# Patient Record
Sex: Female | Born: 1935 | Race: White | Hispanic: No | Smoking: Never smoker
Health system: Southern US, Community
[De-identification: ages and names within clinical notes are randomized; demographics above are authoritative.]

## PROBLEM LIST (undated history)

## (undated) DIAGNOSIS — Z923 Personal history of irradiation: Secondary | ICD-10-CM

## (undated) DIAGNOSIS — M1990 Unspecified osteoarthritis, unspecified site: Secondary | ICD-10-CM

## (undated) DIAGNOSIS — R011 Cardiac murmur, unspecified: Secondary | ICD-10-CM

## (undated) DIAGNOSIS — K219 Gastro-esophageal reflux disease without esophagitis: Secondary | ICD-10-CM

## (undated) DIAGNOSIS — C50919 Malignant neoplasm of unspecified site of unspecified female breast: Secondary | ICD-10-CM

## (undated) DIAGNOSIS — N183 Chronic kidney disease, stage 3 unspecified: Secondary | ICD-10-CM

## (undated) DIAGNOSIS — I10 Essential (primary) hypertension: Secondary | ICD-10-CM

## (undated) DIAGNOSIS — I700 Atherosclerosis of aorta: Secondary | ICD-10-CM

## (undated) DIAGNOSIS — T7840XA Allergy, unspecified, initial encounter: Secondary | ICD-10-CM

## (undated) DIAGNOSIS — K449 Diaphragmatic hernia without obstruction or gangrene: Secondary | ICD-10-CM

## (undated) HISTORY — DX: Allergy, unspecified, initial encounter: T78.40XA

## (undated) HISTORY — PX: ABDOMINAL HYSTERECTOMY: SHX81

## (undated) HISTORY — DX: Essential (primary) hypertension: I10

## (undated) HISTORY — PX: BREAST CYST ASPIRATION: SHX578

---

## 2010-04-14 DIAGNOSIS — K8020 Calculus of gallbladder without cholecystitis without obstruction: Secondary | ICD-10-CM | POA: Insufficient documentation

## 2010-04-14 DIAGNOSIS — D126 Benign neoplasm of colon, unspecified: Secondary | ICD-10-CM | POA: Insufficient documentation

## 2010-04-14 DIAGNOSIS — E559 Vitamin D deficiency, unspecified: Secondary | ICD-10-CM | POA: Insufficient documentation

## 2010-04-14 DIAGNOSIS — M8580 Other specified disorders of bone density and structure, unspecified site: Secondary | ICD-10-CM | POA: Insufficient documentation

## 2010-04-14 DIAGNOSIS — M85851 Other specified disorders of bone density and structure, right thigh: Secondary | ICD-10-CM | POA: Insufficient documentation

## 2011-01-30 HISTORY — PX: BREAST BIOPSY: SHX20

## 2012-01-30 DIAGNOSIS — C50919 Malignant neoplasm of unspecified site of unspecified female breast: Secondary | ICD-10-CM

## 2012-01-30 HISTORY — DX: Malignant neoplasm of unspecified site of unspecified female breast: C50.919

## 2012-01-30 HISTORY — PX: BREAST BIOPSY: SHX20

## 2012-04-02 DIAGNOSIS — C50919 Malignant neoplasm of unspecified site of unspecified female breast: Secondary | ICD-10-CM | POA: Insufficient documentation

## 2012-04-02 DIAGNOSIS — C50912 Malignant neoplasm of unspecified site of left female breast: Secondary | ICD-10-CM | POA: Insufficient documentation

## 2012-04-02 HISTORY — PX: BREAST LUMPECTOMY: SHX2

## 2013-04-14 DIAGNOSIS — M899 Disorder of bone, unspecified: Secondary | ICD-10-CM | POA: Diagnosis not present

## 2013-04-14 DIAGNOSIS — M949 Disorder of cartilage, unspecified: Secondary | ICD-10-CM | POA: Diagnosis not present

## 2013-04-14 DIAGNOSIS — Z79899 Other long term (current) drug therapy: Secondary | ICD-10-CM | POA: Diagnosis not present

## 2013-04-14 DIAGNOSIS — C50919 Malignant neoplasm of unspecified site of unspecified female breast: Secondary | ICD-10-CM | POA: Diagnosis not present

## 2013-05-21 DIAGNOSIS — C50919 Malignant neoplasm of unspecified site of unspecified female breast: Secondary | ICD-10-CM | POA: Diagnosis not present

## 2013-05-21 DIAGNOSIS — E785 Hyperlipidemia, unspecified: Secondary | ICD-10-CM | POA: Diagnosis not present

## 2013-05-21 DIAGNOSIS — R011 Cardiac murmur, unspecified: Secondary | ICD-10-CM | POA: Diagnosis not present

## 2013-05-21 DIAGNOSIS — I10 Essential (primary) hypertension: Secondary | ICD-10-CM | POA: Diagnosis not present

## 2013-05-25 DIAGNOSIS — E785 Hyperlipidemia, unspecified: Secondary | ICD-10-CM | POA: Diagnosis not present

## 2013-05-25 DIAGNOSIS — I10 Essential (primary) hypertension: Secondary | ICD-10-CM | POA: Diagnosis not present

## 2013-06-25 ENCOUNTER — Ambulatory Visit: Payer: Self-pay | Admitting: Hematology and Oncology

## 2013-06-25 DIAGNOSIS — Z170 Estrogen receptor positive status [ER+]: Secondary | ICD-10-CM | POA: Diagnosis not present

## 2013-06-25 DIAGNOSIS — M899 Disorder of bone, unspecified: Secondary | ICD-10-CM | POA: Diagnosis not present

## 2013-06-25 DIAGNOSIS — I10 Essential (primary) hypertension: Secondary | ICD-10-CM | POA: Diagnosis not present

## 2013-06-25 DIAGNOSIS — Z9010 Acquired absence of unspecified breast and nipple: Secondary | ICD-10-CM | POA: Diagnosis not present

## 2013-06-25 DIAGNOSIS — E039 Hypothyroidism, unspecified: Secondary | ICD-10-CM | POA: Diagnosis not present

## 2013-06-25 DIAGNOSIS — C50919 Malignant neoplasm of unspecified site of unspecified female breast: Secondary | ICD-10-CM | POA: Diagnosis not present

## 2013-06-29 ENCOUNTER — Ambulatory Visit: Payer: Self-pay | Admitting: Internal Medicine

## 2013-07-16 DIAGNOSIS — M1990 Unspecified osteoarthritis, unspecified site: Secondary | ICD-10-CM | POA: Diagnosis not present

## 2013-07-16 DIAGNOSIS — E785 Hyperlipidemia, unspecified: Secondary | ICD-10-CM | POA: Diagnosis not present

## 2013-07-16 DIAGNOSIS — C50919 Malignant neoplasm of unspecified site of unspecified female breast: Secondary | ICD-10-CM | POA: Diagnosis not present

## 2013-07-16 DIAGNOSIS — I10 Essential (primary) hypertension: Secondary | ICD-10-CM | POA: Diagnosis not present

## 2013-08-26 DIAGNOSIS — H2510 Age-related nuclear cataract, unspecified eye: Secondary | ICD-10-CM | POA: Diagnosis not present

## 2013-10-05 DIAGNOSIS — Z23 Encounter for immunization: Secondary | ICD-10-CM | POA: Diagnosis not present

## 2013-10-12 ENCOUNTER — Ambulatory Visit: Payer: Self-pay | Admitting: Hematology and Oncology

## 2013-10-12 DIAGNOSIS — R928 Other abnormal and inconclusive findings on diagnostic imaging of breast: Secondary | ICD-10-CM | POA: Diagnosis not present

## 2013-10-12 DIAGNOSIS — Z853 Personal history of malignant neoplasm of breast: Secondary | ICD-10-CM | POA: Diagnosis not present

## 2013-10-13 ENCOUNTER — Ambulatory Visit: Payer: Self-pay | Admitting: Internal Medicine

## 2013-10-13 DIAGNOSIS — Z9010 Acquired absence of unspecified breast and nipple: Secondary | ICD-10-CM | POA: Diagnosis not present

## 2013-10-13 DIAGNOSIS — Z79899 Other long term (current) drug therapy: Secondary | ICD-10-CM | POA: Diagnosis not present

## 2013-10-13 DIAGNOSIS — Z923 Personal history of irradiation: Secondary | ICD-10-CM | POA: Diagnosis not present

## 2013-10-13 DIAGNOSIS — Z853 Personal history of malignant neoplasm of breast: Secondary | ICD-10-CM | POA: Diagnosis not present

## 2013-10-13 DIAGNOSIS — M949 Disorder of cartilage, unspecified: Secondary | ICD-10-CM | POA: Diagnosis not present

## 2013-10-13 DIAGNOSIS — Z170 Estrogen receptor positive status [ER+]: Secondary | ICD-10-CM | POA: Diagnosis not present

## 2013-10-13 DIAGNOSIS — M899 Disorder of bone, unspecified: Secondary | ICD-10-CM | POA: Diagnosis not present

## 2013-10-13 DIAGNOSIS — E785 Hyperlipidemia, unspecified: Secondary | ICD-10-CM | POA: Diagnosis not present

## 2013-10-13 DIAGNOSIS — Z9223 Personal history of estrogen therapy: Secondary | ICD-10-CM | POA: Diagnosis not present

## 2013-10-13 DIAGNOSIS — I10 Essential (primary) hypertension: Secondary | ICD-10-CM | POA: Diagnosis not present

## 2013-10-13 DIAGNOSIS — Z7982 Long term (current) use of aspirin: Secondary | ICD-10-CM | POA: Diagnosis not present

## 2013-10-13 LAB — COMPREHENSIVE METABOLIC PANEL
Albumin: 4 g/dL (ref 3.4–5.0)
Alkaline Phosphatase: 88 U/L
Anion Gap: 8 (ref 7–16)
BUN: 24 mg/dL — AB (ref 7–18)
Bilirubin,Total: 0.9 mg/dL (ref 0.2–1.0)
Calcium, Total: 10.1 mg/dL (ref 8.5–10.1)
Chloride: 102 mmol/L (ref 98–107)
Co2: 27 mmol/L (ref 21–32)
Creatinine: 1.11 mg/dL (ref 0.60–1.30)
EGFR (African American): 56 — ABNORMAL LOW
GFR CALC NON AF AMER: 49 — AB
Glucose: 103 mg/dL — ABNORMAL HIGH (ref 65–99)
OSMOLALITY: 278 (ref 275–301)
POTASSIUM: 4.3 mmol/L (ref 3.5–5.1)
SGOT(AST): 15 U/L (ref 15–37)
SGPT (ALT): 18 U/L
Sodium: 137 mmol/L (ref 136–145)
Total Protein: 7.4 g/dL (ref 6.4–8.2)

## 2013-10-13 LAB — CBC CANCER CENTER
Basophil #: 0 x10 3/mm (ref 0.0–0.1)
Basophil %: 0.3 %
EOS PCT: 1.6 %
Eosinophil #: 0.1 x10 3/mm (ref 0.0–0.7)
HCT: 41.1 % (ref 35.0–47.0)
HGB: 13.7 g/dL (ref 12.0–16.0)
LYMPHS ABS: 1.4 x10 3/mm (ref 1.0–3.6)
Lymphocyte %: 24 %
MCH: 31.5 pg (ref 26.0–34.0)
MCHC: 33.5 g/dL (ref 32.0–36.0)
MCV: 94 fL (ref 80–100)
MONOS PCT: 6.3 %
Monocyte #: 0.4 x10 3/mm (ref 0.2–0.9)
NEUTROS ABS: 4.1 x10 3/mm (ref 1.4–6.5)
Neutrophil %: 67.8 %
PLATELETS: 194 x10 3/mm (ref 150–440)
RBC: 4.36 10*6/uL (ref 3.80–5.20)
RDW: 12.5 % (ref 11.5–14.5)
WBC: 6 x10 3/mm (ref 3.6–11.0)

## 2013-10-14 LAB — CANCER ANTIGEN 27.29: CA 27.29: 30.2 U/mL (ref 0.0–38.6)

## 2013-10-29 ENCOUNTER — Ambulatory Visit: Payer: Self-pay | Admitting: Internal Medicine

## 2013-11-20 DIAGNOSIS — I779 Disorder of arteries and arterioles, unspecified: Secondary | ICD-10-CM | POA: Diagnosis not present

## 2013-11-20 DIAGNOSIS — M170 Bilateral primary osteoarthritis of knee: Secondary | ICD-10-CM | POA: Diagnosis not present

## 2013-11-20 DIAGNOSIS — R011 Cardiac murmur, unspecified: Secondary | ICD-10-CM | POA: Diagnosis not present

## 2013-11-20 DIAGNOSIS — M8580 Other specified disorders of bone density and structure, unspecified site: Secondary | ICD-10-CM | POA: Diagnosis not present

## 2013-11-20 DIAGNOSIS — I10 Essential (primary) hypertension: Secondary | ICD-10-CM | POA: Diagnosis not present

## 2013-11-20 DIAGNOSIS — E785 Hyperlipidemia, unspecified: Secondary | ICD-10-CM | POA: Diagnosis not present

## 2013-11-20 DIAGNOSIS — D0512 Intraductal carcinoma in situ of left breast: Secondary | ICD-10-CM | POA: Diagnosis not present

## 2014-04-13 ENCOUNTER — Ambulatory Visit
Admit: 2014-04-13 | Disposition: A | Discharge status: Home / Self Care | Payer: Self-pay | Attending: Hematology and Oncology | Admitting: Hematology and Oncology

## 2014-04-13 DIAGNOSIS — E785 Hyperlipidemia, unspecified: Secondary | ICD-10-CM | POA: Diagnosis not present

## 2014-04-13 DIAGNOSIS — Z9223 Personal history of estrogen therapy: Secondary | ICD-10-CM | POA: Diagnosis not present

## 2014-04-13 DIAGNOSIS — Z170 Estrogen receptor positive status [ER+]: Secondary | ICD-10-CM | POA: Diagnosis not present

## 2014-04-13 DIAGNOSIS — M549 Dorsalgia, unspecified: Secondary | ICD-10-CM | POA: Diagnosis not present

## 2014-04-13 DIAGNOSIS — Z923 Personal history of irradiation: Secondary | ICD-10-CM | POA: Diagnosis not present

## 2014-04-13 DIAGNOSIS — Z9012 Acquired absence of left breast and nipple: Secondary | ICD-10-CM | POA: Diagnosis not present

## 2014-04-13 DIAGNOSIS — Z7982 Long term (current) use of aspirin: Secondary | ICD-10-CM | POA: Diagnosis not present

## 2014-04-13 DIAGNOSIS — M25569 Pain in unspecified knee: Secondary | ICD-10-CM | POA: Diagnosis not present

## 2014-04-13 DIAGNOSIS — M85852 Other specified disorders of bone density and structure, left thigh: Secondary | ICD-10-CM | POA: Diagnosis not present

## 2014-04-13 DIAGNOSIS — Z853 Personal history of malignant neoplasm of breast: Secondary | ICD-10-CM | POA: Diagnosis not present

## 2014-04-13 DIAGNOSIS — I10 Essential (primary) hypertension: Secondary | ICD-10-CM | POA: Diagnosis not present

## 2014-04-13 DIAGNOSIS — Z79899 Other long term (current) drug therapy: Secondary | ICD-10-CM | POA: Diagnosis not present

## 2014-04-30 ENCOUNTER — Ambulatory Visit
Admit: 2014-04-30 | Disposition: A | Discharge status: Home / Self Care | Payer: Self-pay | Attending: Hematology and Oncology | Admitting: Hematology and Oncology

## 2014-05-14 ENCOUNTER — Other Ambulatory Visit: Payer: Self-pay | Admitting: Hematology and Oncology

## 2014-05-14 DIAGNOSIS — M949 Disorder of cartilage, unspecified: Secondary | ICD-10-CM

## 2014-10-14 ENCOUNTER — Ambulatory Visit
Admission: RE | Admit: 2014-10-14 | Discharge: 2014-10-14 | Disposition: A | Discharge status: Home / Self Care | Payer: Medicare Other | Source: Ambulatory Visit | Attending: Hematology and Oncology | Admitting: Hematology and Oncology

## 2014-10-14 DIAGNOSIS — M859 Disorder of bone density and structure, unspecified: Secondary | ICD-10-CM | POA: Diagnosis not present

## 2014-10-14 DIAGNOSIS — R928 Other abnormal and inconclusive findings on diagnostic imaging of breast: Secondary | ICD-10-CM | POA: Diagnosis not present

## 2014-10-14 DIAGNOSIS — M949 Disorder of cartilage, unspecified: Secondary | ICD-10-CM

## 2014-10-14 DIAGNOSIS — Z853 Personal history of malignant neoplasm of breast: Secondary | ICD-10-CM | POA: Diagnosis not present

## 2014-10-14 HISTORY — DX: Malignant neoplasm of unspecified site of unspecified female breast: C50.919

## 2014-10-21 ENCOUNTER — Inpatient Hospital Stay: Payer: Medicare Other

## 2014-10-21 ENCOUNTER — Encounter: Payer: Self-pay | Admitting: Hematology and Oncology

## 2014-10-21 ENCOUNTER — Inpatient Hospital Stay: Payer: Medicare Other | Attending: Hematology and Oncology | Admitting: Hematology and Oncology

## 2014-10-21 ENCOUNTER — Other Ambulatory Visit: Payer: Self-pay

## 2014-10-21 VITALS — BP 125/76 | HR 73 | Temp 97.4°F | Resp 18 | Ht 63.0 in | Wt 181.2 lb

## 2014-10-21 DIAGNOSIS — Z853 Personal history of malignant neoplasm of breast: Secondary | ICD-10-CM | POA: Diagnosis not present

## 2014-10-21 DIAGNOSIS — I10 Essential (primary) hypertension: Secondary | ICD-10-CM | POA: Diagnosis not present

## 2014-10-21 DIAGNOSIS — Z79899 Other long term (current) drug therapy: Secondary | ICD-10-CM

## 2014-10-21 DIAGNOSIS — Z803 Family history of malignant neoplasm of breast: Secondary | ICD-10-CM | POA: Diagnosis not present

## 2014-10-21 DIAGNOSIS — Z170 Estrogen receptor positive status [ER+]: Secondary | ICD-10-CM | POA: Insufficient documentation

## 2014-10-21 DIAGNOSIS — Z9223 Personal history of estrogen therapy: Secondary | ICD-10-CM | POA: Diagnosis not present

## 2014-10-21 DIAGNOSIS — C50912 Malignant neoplasm of unspecified site of left female breast: Secondary | ICD-10-CM

## 2014-10-21 DIAGNOSIS — M8589 Other specified disorders of bone density and structure, multiple sites: Secondary | ICD-10-CM | POA: Diagnosis not present

## 2014-10-21 LAB — CBC WITH DIFFERENTIAL/PLATELET
Basophils Absolute: 0 10*3/uL (ref 0–0.1)
Basophils Relative: 0 %
Eosinophils Absolute: 0.1 10*3/uL (ref 0–0.7)
Eosinophils Relative: 2 %
HCT: 37 % (ref 35.0–47.0)
Hemoglobin: 12.6 g/dL (ref 12.0–16.0)
Lymphocytes Relative: 22 %
Lymphs Abs: 0.9 10*3/uL — ABNORMAL LOW (ref 1.0–3.6)
MCH: 30.9 pg (ref 26.0–34.0)
MCHC: 34.1 g/dL (ref 32.0–36.0)
MCV: 90.8 fL (ref 80.0–100.0)
Monocytes Absolute: 0.3 10*3/uL (ref 0.2–0.9)
Monocytes Relative: 7 %
Neutro Abs: 2.7 10*3/uL (ref 1.4–6.5)
Neutrophils Relative %: 69 %
Platelets: 156 10*3/uL (ref 150–440)
RBC: 4.08 MIL/uL (ref 3.80–5.20)
RDW: 12.8 % (ref 11.5–14.5)
WBC: 3.9 10*3/uL (ref 3.6–11.0)

## 2014-10-21 LAB — COMPREHENSIVE METABOLIC PANEL
ALT: 12 U/L — ABNORMAL LOW (ref 14–54)
AST: 21 U/L (ref 15–41)
Albumin: 4.1 g/dL (ref 3.5–5.0)
Alkaline Phosphatase: 59 U/L (ref 38–126)
Anion gap: 7 (ref 5–15)
BUN: 29 mg/dL — ABNORMAL HIGH (ref 6–20)
CO2: 25 mmol/L (ref 22–32)
Calcium: 8.8 mg/dL — ABNORMAL LOW (ref 8.9–10.3)
Chloride: 104 mmol/L (ref 101–111)
Creatinine, Ser: 1.25 mg/dL — ABNORMAL HIGH (ref 0.44–1.00)
GFR calc Af Amer: 47 mL/min — ABNORMAL LOW (ref >60)
GFR calc non Af Amer: 41 mL/min — ABNORMAL LOW (ref >60)
Glucose, Bld: 111 mg/dL — ABNORMAL HIGH (ref 65–99)
Potassium: 3.6 mmol/L (ref 3.5–5.1)
Sodium: 136 mmol/L (ref 135–145)
Total Bilirubin: 0.8 mg/dL (ref 0.3–1.2)
Total Protein: 7.1 g/dL (ref 6.5–8.1)

## 2014-10-21 NOTE — Progress Notes (Signed)
[facility] day:  10/21/2014  Chief Complaint: Angela Bullock is a 79 y.o. female with stage I left breast cancer who is seen for 6 month assessment.  HPI: The patient was last seen in the medical oncology clinic on 04/13/2014.  At that time, she was seen for initial assessment by me.  Symptomatically, she was doing well.  Exam was unremarkable.  Labs were normal including a CBC with diff, CMP, and CA27.29 (25.1).  Mammogram on 10/14/2014 was benign.  Bone density study on 10/14/2014 revealed a T-score of -1.7 in the right femur neck and a T-score of -0.3 in the left forearm radius.  During the interim, she has done well.  She voices no concerns except for allergies.  She moved to a new area and feels she may be allergic to the trees.    Past Medical History  Diagnosis Date  . Breast cancer 01/30/2012    left, radiation and lumpectomy  . Allergy   . Hypertension     Past Surgical History  Procedure Laterality Date  . Breast lumpectomy Left 04/02/2012    positive  . Breast cyst aspiration    . Breast biopsy Left 01/30/2011    negative  . Breast biopsy Left 01/30/2012    positive  . Abdominal hysterectomy      Family History  Problem Relation Age of Onset  . Breast cancer Paternal Aunt 85  . Osteoporosis Mother   . Heart attack Father     Social History:  has no tobacco, alcohol, and drug history on file.  The patient is alone today.  Allergies: No Known Allergies  Current Medications: Current Outpatient Prescriptions  Medication Sig Dispense Refill  . atorvastatin (LIPITOR) 10 MG tablet Take 1 tablet by mouth daily.  3  . calcium carbonate (OSCAL) 1500 (600 CA) MG TABS tablet Take by mouth 2 (two) times daily with a meal.    . cholecalciferol (VITAMIN D) 1000 UNITS tablet Take 1,000 Units by mouth daily.    Marland Kitchen co-enzyme Q-10 50 MG capsule Take 50 mg by mouth daily.    Marland Kitchen glucosamine-chondroitin 500-400 MG tablet Take 1 tablet by mouth  3 (three) times daily.    Marland Kitchen ibandronate (BONIVA) 150 MG tablet Take 1 tablet by mouth every 30 (thirty) days.  3  . lisinopril-hydrochlorothiazide (PRINZIDE,ZESTORETIC) 10-12.5 MG per tablet Take 1 tablet by mouth daily.  3  . metoprolol tartrate (LOPRESSOR) 25 MG tablet Take 1 tablet by mouth daily.  3  . Misc Natural Products (GLUCOSAMINE CHONDROITIN TRIPLE) TABS Take 1 tablet by mouth daily.    . Multiple Vitamins-Minerals (CENTRUM SILVER PO) Take 1 tablet by mouth daily.    . Omega-3 Krill Oil 300 MG CAPS Take 1 capsule by mouth daily.     No current facility-administered medications for this visit.    Review of Systems:  GENERAL:  Feels good.  Active.  No fevers, sweats or weight loss. PERFORMANCE STATUS (ECOG):  0 HEENT:  Allergies.  No visual changes, runny nose, sore throat, mouth sores or tenderness. Lungs: No shortness of breath or cough.  No hemoptysis. Cardiac:  No chest pain, palpitations, orthopnea, or PND. GI:  No nausea, vomiting, diarrhea, constipation, melena or hematochezia. GU:  No urgency, frequency, dysuria, or hematuria. Musculoskeletal:  No back pain.  No joint pain.  No muscle tenderness. Extremities:  No pain or swelling. Skin:  No rashes or skin changes. Neuro:  No headache, numbness or  weakness, balance or coordination issues. Endocrine:  No diabetes, thyroid issues, hot flashes or night sweats. Psych:  No mood changes, depression or anxiety. Pain:  No focal pain. Review of systems:  All other systems reviewed and found to be negative.  Physical Exam: Blood pressure 125/76, pulse 73, temperature 97.4 F (36.3 C), temperature source Tympanic, resp. rate 18, height $RemoveBe'5\' 3"'[id number]$  (1.6 m), weight 181 lb 3.5 oz (82.2 kg). GENERAL:  Well developed, well nourished, sitting comfortably in the exam room in no acute distress. MENTAL STATUS:  Alert and oriented to person, place and time. HEAD:  Short gray hair.  Normocephalic, atraumatic, face symmetric, no Cushingoid  features. EYES:  Blue eyes.  Pupils equal round and reactive to light and accomodation.  No conjunctivitis or scleral icterus. ENT:  Oropharynx clear without lesion.  Tongue normal. Mucous membranes moist.  RESPIRATORY:  Clear to auscultation without rales, wheezes or rhonchi. CARDIOVASCULAR:  Regular rate and rhythm without murmur, rub or gallop. BREAST:  Right breast without masses, skin changes or nipple discharge.  Left breast with well healed incision at the 2-3 o'clock position.  Left breast without masses, skin changes or nipple discharge.  Bilateral fibrocystic changes. ABDOMEN:  Soft, non-tender, with active bowel sounds, and no hepatosplenomegaly.  No masses. SKIN:  No rashes, ulcers or lesions. EXTREMITIES: No edema, no skin discoloration or tenderness.  No palpable cords. LYMPH NODES: No palpable cervical, supraclavicular, axillary or inguinal adenopathy  NEUROLOGICAL: Unremarkable. PSYCH:  Appropriate.  Appointment on 10/21/2014  Component Date Value Ref Range Status  . WBC 10/21/2014 3.9  3.6 - 11.0 K/uL Final  . RBC 10/21/2014 4.08  3.80 - 5.20 MIL/uL Final  . Hemoglobin 10/21/2014 12.6  12.0 - 16.0 g/dL Final  . HCT 10/21/2014 37.0  35.0 - 47.0 % Final  . MCV 10/21/2014 90.8  80.0 - 100.0 fL Final  . MCH 10/21/2014 30.9  26.0 - 34.0 pg Final  . MCHC 10/21/2014 34.1  32.0 - 36.0 g/dL Final  . RDW 10/21/2014 12.8  11.5 - 14.5 % Final  . Platelets 10/21/2014 156  150 - 440 K/uL Final  . Neutrophils Relative % 10/21/2014 69   Final  . Neutro Abs 10/21/2014 2.7  1.4 - 6.5 K/uL Final  . Lymphocytes Relative 10/21/2014 22   Final  . Lymphs Abs 10/21/2014 0.9* 1.0 - 3.6 K/uL Final  . Monocytes Relative 10/21/2014 7   Final  . Monocytes Absolute 10/21/2014 0.3  0.2 - 0.9 K/uL Final  . Eosinophils Relative 10/21/2014 2   Final  . Eosinophils Absolute 10/21/2014 0.1  0 - 0.7 K/uL Final  . Basophils Relative 10/21/2014 0   Final  . Basophils Absolute 10/21/2014 0.0  0 - 0.1 K/uL  Final  . Sodium 10/21/2014 136  135 - 145 mmol/L Final  . Potassium 10/21/2014 3.6  3.5 - 5.1 mmol/L Final  . Chloride 10/21/2014 104  101 - 111 mmol/L Final  . CO2 10/21/2014 25  22 - 32 mmol/L Final  . Glucose, Bld 10/21/2014 111* 65 - 99 mg/dL Final  . BUN 10/21/2014 29* 6 - 20 mg/dL Final  . Creatinine, Ser 10/21/2014 1.25* 0.44 - 1.00 mg/dL Final  . Calcium 10/21/2014 8.8* 8.9 - 10.3 mg/dL Final  . Total Protein 10/21/2014 7.1  6.5 - 8.1 g/dL Final  . Albumin 10/21/2014 4.1  3.5 - 5.0 g/dL Final  . AST 10/21/2014 21  15 - 41 U/L Final  . ALT 10/21/2014 12* 14 - 54 U/L Final  .  Alkaline Phosphatase 10/21/2014 59  38 - 126 U/L Final  . Total Bilirubin 10/21/2014 0.8  0.3 - 1.2 mg/dL Final  . GFR calc non Af Amer 10/21/2014 41* >60 mL/min Final  . GFR calc Af Amer 10/21/2014 47* >60 mL/min Final   Comment: (NOTE) The eGFR has been calculated using the CKD EPI equation. This calculation has not been validated in all clinical situations. eGFR's persistently <60 mL/min signify possible Chronic Kidney Disease.   . Anion gap 10/21/2014 7  5 - 15 Final    Assessment:  Angela Bullock is a 79 y.o. female with a history of stage I left breast cancer status post excisional biopsy on 04/02/2012. Pathology revealed a 1.3 cm moderately differentiated basal cell carcinoma. There was no DCIS or vascular invasion. Tumor extended to the lateral margin.  Pathology was ER +100%, PR +73% and HER-2/neu 1+.  She then underwent left partial mastectomy with sentinel lymph node biopsy on 05/02/2012.  Pathology revealed no evidence of residual malignancy. Margins were negative. Zero of 6 sentinel lymph nodes were positive. Pathologic stage was T1cN0M0.  She underwent Mammosite radiation. She began Arimidex on 05/2012. She took Arimidex for approximately 1 year. She discontinued Arimidex in 05/2013 as she did not like the side effect profile.  Bone density study on 10/08/2012 revealed osteopenia (T score -1.8 in  left femoral neck).  Bone density study on 10/14/2014 revealed a T-score of -1.7 in the right femur neck.  She is on calcium and vitamin D.  Mammogram on 10/14/2014 was benign.   Symptomatically, she has had issues with allergies. Exam is unremarkable.  Plan: 1. Labs today:  CBC with diff, CMP, CA27.29. 2. Re-review patient's decision for no hormonal therapy. 3. Review mammogram and bone density study. 4. Discuss monthly self exams. 5. RTC in 6 months for MD assess and labs (CBC with diff, CMP, CA27.29).   Lequita Asal, MD  10/21/2014, 11:25 AM

## 2014-10-22 ENCOUNTER — Other Ambulatory Visit: Payer: Self-pay | Admitting: Family Medicine

## 2014-10-22 ENCOUNTER — Ambulatory Visit (INDEPENDENT_AMBULATORY_CARE_PROVIDER_SITE_OTHER): Payer: Medicare Other | Admitting: *Deleted

## 2014-10-22 DIAGNOSIS — Z23 Encounter for immunization: Secondary | ICD-10-CM

## 2014-10-22 LAB — CANCER ANTIGEN 27.29: CA 27.29: 33.7 U/mL (ref 0.0–38.6)

## 2014-10-25 NOTE — Patient Instructions (Signed)
Patient was in the office with her spouse and got the high dose flu injection.

## 2014-11-19 DIAGNOSIS — L720 Epidermal cyst: Secondary | ICD-10-CM | POA: Diagnosis not present

## 2014-11-19 DIAGNOSIS — D225 Melanocytic nevi of trunk: Secondary | ICD-10-CM | POA: Diagnosis not present

## 2014-11-26 ENCOUNTER — Other Ambulatory Visit: Payer: Self-pay | Admitting: Family Medicine

## 2014-12-22 DIAGNOSIS — H2513 Age-related nuclear cataract, bilateral: Secondary | ICD-10-CM | POA: Diagnosis not present

## 2014-12-25 ENCOUNTER — Other Ambulatory Visit: Payer: Self-pay | Admitting: Family Medicine

## 2015-02-10 ENCOUNTER — Other Ambulatory Visit: Payer: Self-pay | Admitting: Family Medicine

## 2015-04-21 ENCOUNTER — Ambulatory Visit: Payer: Medicare Other | Admitting: Hematology and Oncology

## 2015-04-21 ENCOUNTER — Other Ambulatory Visit: Payer: Medicare Other

## 2015-04-27 ENCOUNTER — Other Ambulatory Visit: Payer: Self-pay

## 2015-04-27 DIAGNOSIS — C50912 Malignant neoplasm of unspecified site of left female breast: Secondary | ICD-10-CM

## 2015-04-28 ENCOUNTER — Inpatient Hospital Stay (HOSPITAL_BASED_OUTPATIENT_CLINIC_OR_DEPARTMENT_OTHER): Payer: Medicare Other | Admitting: Hematology and Oncology

## 2015-04-28 ENCOUNTER — Encounter: Payer: Self-pay | Admitting: Hematology and Oncology

## 2015-04-28 ENCOUNTER — Inpatient Hospital Stay: Payer: Medicare Other | Attending: Hematology and Oncology

## 2015-04-28 VITALS — BP 145/81 | HR 92 | Temp 96.8°F | Resp 18 | Ht 63.0 in | Wt 179.9 lb

## 2015-04-28 DIAGNOSIS — Z803 Family history of malignant neoplasm of breast: Secondary | ICD-10-CM | POA: Insufficient documentation

## 2015-04-28 DIAGNOSIS — C50912 Malignant neoplasm of unspecified site of left female breast: Secondary | ICD-10-CM

## 2015-04-28 DIAGNOSIS — Z79899 Other long term (current) drug therapy: Secondary | ICD-10-CM | POA: Insufficient documentation

## 2015-04-28 DIAGNOSIS — M8580 Other specified disorders of bone density and structure, unspecified site: Secondary | ICD-10-CM

## 2015-04-28 DIAGNOSIS — Z853 Personal history of malignant neoplasm of breast: Secondary | ICD-10-CM

## 2015-04-28 DIAGNOSIS — I10 Essential (primary) hypertension: Secondary | ICD-10-CM | POA: Diagnosis not present

## 2015-04-28 DIAGNOSIS — Z9223 Personal history of estrogen therapy: Secondary | ICD-10-CM

## 2015-04-28 DIAGNOSIS — Z923 Personal history of irradiation: Secondary | ICD-10-CM | POA: Insufficient documentation

## 2015-04-28 DIAGNOSIS — Z170 Estrogen receptor positive status [ER+]: Secondary | ICD-10-CM | POA: Diagnosis not present

## 2015-04-28 LAB — CBC WITH DIFFERENTIAL/PLATELET
Basophils Absolute: 0 10*3/uL (ref 0–0.1)
Basophils Relative: 0 %
Eosinophils Absolute: 0.1 10*3/uL (ref 0–0.7)
Eosinophils Relative: 1 %
HCT: 37.3 % (ref 35.0–47.0)
Hemoglobin: 13.1 g/dL (ref 12.0–16.0)
Lymphocytes Relative: 22 %
Lymphs Abs: 1 10*3/uL (ref 1.0–3.6)
MCH: 32 pg (ref 26.0–34.0)
MCHC: 35.1 g/dL (ref 32.0–36.0)
MCV: 91.2 fL (ref 80.0–100.0)
Monocytes Absolute: 0.3 10*3/uL (ref 0.2–0.9)
Monocytes Relative: 6 %
Neutro Abs: 3.1 10*3/uL (ref 1.4–6.5)
Neutrophils Relative %: 71 %
Platelets: 141 10*3/uL — ABNORMAL LOW (ref 150–440)
RBC: 4.1 MIL/uL (ref 3.80–5.20)
RDW: 12.5 % (ref 11.5–14.5)
WBC: 4.5 10*3/uL (ref 3.6–11.0)

## 2015-04-28 LAB — COMPREHENSIVE METABOLIC PANEL
ALT: 16 U/L (ref 14–54)
AST: 21 U/L (ref 15–41)
Albumin: 4.2 g/dL (ref 3.5–5.0)
Alkaline Phosphatase: 63 U/L (ref 38–126)
Anion gap: 6 (ref 5–15)
BUN: 27 mg/dL — ABNORMAL HIGH (ref 6–20)
CO2: 28 mmol/L (ref 22–32)
Calcium: 9.6 mg/dL (ref 8.9–10.3)
Chloride: 104 mmol/L (ref 101–111)
Creatinine, Ser: 1.19 mg/dL — ABNORMAL HIGH (ref 0.44–1.00)
GFR calc Af Amer: 50 mL/min — ABNORMAL LOW (ref >60)
GFR calc non Af Amer: 43 mL/min — ABNORMAL LOW (ref >60)
Glucose, Bld: 110 mg/dL — ABNORMAL HIGH (ref 65–99)
Potassium: 3.9 mmol/L (ref 3.5–5.1)
Sodium: 138 mmol/L (ref 135–145)
Total Bilirubin: 0.9 mg/dL (ref 0.3–1.2)
Total Protein: 7.2 g/dL (ref 6.5–8.1)

## 2015-04-28 NOTE — Progress Notes (Signed)
No changes

## 2015-04-28 NOTE — Progress Notes (Signed)
[facility] day:  04/28/2015  Chief Complaint: Angela Bullock is a 80 y.o. female with stage I left breast cancer who is seen for 6 month assessment.  HPI: The patient was last seen in the medical oncology clinic on 10/21/2014.  At that time, she was seen 6 month assessment.  Symptomatically, she was doing well.  She voiced no concerns except for allergies.  Exam was unremarkable.  Labs were normal including a CA27.29 (33.7).  During the interim, she has done well.  She notes "the same old, same old".  She is not doing regular monthly exams.  She denies any breast concerns.   Past Medical History  Diagnosis Date  . Breast cancer ([place]) 01/30/2012    left, radiation and lumpectomy  . Allergy   . Hypertension     Past Surgical History  Procedure Laterality Date  . Breast lumpectomy Left 04/02/2012    positive  . Breast cyst aspiration    . Breast biopsy Left 01/30/2011    negative  . Breast biopsy Left 01/30/2012    positive  . Abdominal hysterectomy      Family History  Problem Relation Age of Onset  . Breast cancer Paternal Aunt 85  . Osteoporosis Mother   . Heart attack Father     Social History:  reports that she has never smoked. She does not have any smokeless tobacco history on file. She reports that she does not drink alcohol. Her drug history is not on file.  The patient is alone today.  Allergies: No Known Allergies  Current Medications: Current Outpatient Prescriptions  Medication Sig Dispense Refill  . atorvastatin (LIPITOR) 10 MG tablet TAKE 1 TABLET BY MOUTH AT BEDTIME 90 tablet 3  . calcium carbonate (OSCAL) 1500 (600 CA) MG TABS tablet Take by mouth 2 (two) times daily with a meal.    . cholecalciferol (VITAMIN D) 1000 UNITS tablet Take 1,000 Units by mouth daily.    Marland Kitchen co-enzyme Q-10 50 MG capsule Take 50 mg by mouth daily.    Marland Kitchen glucosamine-chondroitin 500-400 MG tablet Take 1 tablet by mouth 3 (three) times daily.     Marland Kitchen ibandronate (BONIVA) 150 MG tablet TAKE 1 TABLET BY MOUTH MONTHLY. 3 tablet 3  . lisinopril-hydrochlorothiazide (PRINZIDE,ZESTORETIC) 10-12.5 MG tablet TAKE 1 TABLET BY MOUTH EVERY DAY 90 tablet 3  . metoprolol tartrate (LOPRESSOR) 25 MG tablet TAKE ONE-HALF TABLET BY MOUTH TWICE DAILY 90 tablet 0  . Misc Natural Products (GLUCOSAMINE CHONDROITIN TRIPLE) TABS Take 1 tablet by mouth daily.    . Multiple Vitamins-Minerals (CENTRUM SILVER PO) Take 1 tablet by mouth daily.    . Omega-3 Krill Oil 300 MG CAPS Take 1 capsule by mouth daily.     No current facility-administered medications for this visit.    Review of Systems:  GENERAL:  Feels good.  No fevers or sweats.  Weight down 2 pounds. PERFORMANCE STATUS (ECOG):  0 HEENT:  Allergies.  No visual changes, runny nose, sore throat, mouth sores or tenderness. Lungs: No shortness of breath or cough.  No hemoptysis. Cardiac:  No chest pain, palpitations, orthopnea, or PND. GI:  No nausea, vomiting, diarrhea, constipation, melena or hematochezia. GU:  No urgency, frequency, dysuria, or hematuria. Musculoskeletal:  No back pain.  No joint pain.  No muscle tenderness. Extremities:  No pain or swelling. Skin:  No rashes or skin changes. Neuro:  No headache, numbness or weakness, balance or coordination issues. Endocrine:  No diabetes, thyroid issues, hot flashes or night sweats. Psych:  No mood changes, depression or anxiety. Pain:  No focal pain. Review of systems:  All other systems reviewed and found to be negative.  Physical Exam: Blood pressure 145/81, pulse 92, temperature 96.8 F (36 C), temperature source Tympanic, resp. rate 18, height _0  (1.6 m), weight 179 lb 14.3 oz (81.6 kg). GENERAL:  Well developed, well nourished, sitting comfortably in the exam room in no acute distress. MENTAL STATUS:  Alert and oriented to person, place and time. HEAD:  Short gray hair.  Normocephalic, atraumatic, face symmetric, no Cushingoid  features. EYES:  Blue eyes.  Pupils equal round and reactive to light and accomodation.  No conjunctivitis or scleral icterus. ENT:  Oropharynx clear without lesion.  Tongue normal. Mucous membranes moist.  RESPIRATORY:  Clear to auscultation without rales, wheezes or rhonchi. CARDIOVASCULAR:  Regular rate and rhythm without murmur, rub or gallop. BREAST:  Right breast without masses, skin changes or nipple discharge.  Left breast with well healed incision at the 2-3 o'clock position.  Left breast without masses, skin changes or nipple discharge.  Bilateral fibrocystic changes. ABDOMEN:  Soft, non-tender, with active bowel sounds, and no appreciable hepatosplenomegaly.  No masses. SKIN:  No rashes, ulcers or lesions. EXTREMITIES: No edema, no skin discoloration or tenderness.  No palpable cords. LYMPH NODES: No palpable cervical, supraclavicular, axillary or inguinal adenopathy  NEUROLOGICAL: Unremarkable. PSYCH:  Appropriate.  Appointment on 04/28/2015  Component Date Value Ref Range Status  . WBC 04/28/2015 4.5  3.6 - 11.0 K/uL Final  . RBC 04/28/2015 4.10  3.80 - 5.20 MIL/uL Final  . Hemoglobin 04/28/2015 13.1  12.0 - 16.0 g/dL Final  . HCT 04/28/2015 37.3  35.0 - 47.0 % Final  . MCV 04/28/2015 91.2  80.0 - 100.0 fL Final  . MCH 04/28/2015 32.0  26.0 - 34.0 pg Final  . MCHC 04/28/2015 35.1  32.0 - 36.0 g/dL Final  . RDW 04/28/2015 12.5  11.5 - 14.5 % Final  . Platelets 04/28/2015 141* 150 - 440 K/uL Final  . Neutrophils Relative % 04/28/2015 71   Final  . Neutro Abs 04/28/2015 3.1  1.4 - 6.5 K/uL Final  . Lymphocytes Relative 04/28/2015 22   Final  . Lymphs Abs 04/28/2015 1.0  1.0 - 3.6 K/uL Final  . Monocytes Relative 04/28/2015 6   Final  . Monocytes Absolute 04/28/2015 0.3  0.2 - 0.9 K/uL Final  . Eosinophils Relative 04/28/2015 1   Final  . Eosinophils Absolute 04/28/2015 0.1  0 - 0.7 K/uL Final  . Basophils Relative 04/28/2015 0   Final  . Basophils Absolute 04/28/2015 0.0  0  - 0.1 K/uL Final  . Sodium 04/28/2015 138  135 - 145 mmol/L Final  . Potassium 04/28/2015 3.9  3.5 - 5.1 mmol/L Final  . Chloride 04/28/2015 104  101 - 111 mmol/L Final  . CO2 04/28/2015 28  22 - 32 mmol/L Final  . Glucose, Bld 04/28/2015 110* 65 - 99 mg/dL Final  . BUN 04/28/2015 27* 6 - 20 mg/dL Final  . Creatinine, Ser 04/28/2015 1.19* 0.44 - 1.00 mg/dL Final  . Calcium 04/28/2015 9.6  8.9 - 10.3 mg/dL Final  . Total Protein 04/28/2015 7.2  6.5 - 8.1 g/dL Final  . Albumin 04/28/2015 4.2  3.5 - 5.0 g/dL Final  . AST 04/28/2015 21  15 - 41 U/L Final  . ALT 04/28/2015 16  14 - 54 U/L Final  . Alkaline Phosphatase  04/28/2015 63  38 - 126 U/L Final  . Total Bilirubin 04/28/2015 0.9  0.3 - 1.2 mg/dL Final  . GFR calc non Af Amer 04/28/2015 43* >60 mL/min Final  . GFR calc Af Amer 04/28/2015 50* >60 mL/min Final   Comment: (NOTE) The eGFR has been calculated using the CKD EPI equation. This calculation has not been validated in all clinical situations. eGFR's persistently <60 mL/min signify possible Chronic Kidney Disease.   . Anion gap 04/28/2015 6  5 - 15 Final    Assessment:  Angela Bullock is a 80 y.o. female with a history of stage I left breast cancer status post excisional biopsy on 04/02/2012. Pathology revealed a 1.3 cm moderately differentiated basal cell carcinoma. There was no DCIS or vascular invasion. Tumor extended to the lateral margin.  Pathology was ER +100%, PR +73% and HER-2/neu 1+.  She then underwent left partial mastectomy with sentinel lymph node biopsy on 05/02/2012.  Pathology revealed no evidence of residual malignancy. Margins were negative. Zero of 6 sentinel lymph nodes were positive. Pathologic stage was T1cN0M0.  She underwent Mammosite radiation. She began Arimidex on 05/2012. She took Arimidex for approximately 1 year. She discontinued Arimidex in 05/2013 as she did not like the side effect profile.  She decided against any other hormonal therapy.    Mammogram on 10/14/2014 was benign.  CA27.29 was 33.7 on 10/21/2014 and 26.1 on 04/28/2015.  Bone density study on 10/08/2012 revealed osteopenia (T score -1.8 in left femoral neck).  Bone density study on 10/14/2014 revealed a T-score of -1.7 in the right femur neck.  She is on calcium and vitamin D.  Symptomatically, she feels good. Exam is unremarkable.  Plan: 1.  Labs today:  CBC with diff, CMP, CA27.29. 2.  Discuss monthly self exams. 3.  Schedule mammogram after 10/14/2015. 4.  RTC in 6 months for MD assessment, labs (CBC with diff, CMP, CA27.29), and review of mammogram.   Lequita Asal, MD  04/28/2015, 11:03 AM

## 2015-04-29 LAB — CANCER ANTIGEN 27.29: CA 27.29: 26.1 U/mL (ref 0.0–38.6)

## 2015-05-11 ENCOUNTER — Other Ambulatory Visit: Payer: Self-pay | Admitting: Family Medicine

## 2015-05-22 ENCOUNTER — Encounter: Payer: Self-pay | Admitting: Hematology and Oncology

## 2015-10-17 ENCOUNTER — Other Ambulatory Visit: Payer: Self-pay | Admitting: Hematology and Oncology

## 2015-10-17 ENCOUNTER — Ambulatory Visit
Admission: RE | Admit: 2015-10-17 | Discharge: 2015-10-17 | Disposition: A | Discharge status: Home / Self Care | Payer: Medicare Other | Source: Ambulatory Visit | Attending: Hematology and Oncology | Admitting: Hematology and Oncology

## 2015-10-17 DIAGNOSIS — C50912 Malignant neoplasm of unspecified site of left female breast: Secondary | ICD-10-CM

## 2015-10-17 DIAGNOSIS — Z853 Personal history of malignant neoplasm of breast: Secondary | ICD-10-CM | POA: Insufficient documentation

## 2015-10-17 DIAGNOSIS — N6002 Solitary cyst of left breast: Secondary | ICD-10-CM | POA: Insufficient documentation

## 2015-10-17 DIAGNOSIS — R921 Mammographic calcification found on diagnostic imaging of breast: Secondary | ICD-10-CM | POA: Insufficient documentation

## 2015-10-17 DIAGNOSIS — L723 Sebaceous cyst: Secondary | ICD-10-CM | POA: Diagnosis not present

## 2015-10-17 DIAGNOSIS — R928 Other abnormal and inconclusive findings on diagnostic imaging of breast: Secondary | ICD-10-CM | POA: Diagnosis not present

## 2015-10-28 ENCOUNTER — Ambulatory Visit: Payer: Medicare Other | Admitting: Hematology and Oncology

## 2015-10-28 ENCOUNTER — Inpatient Hospital Stay: Payer: Medicare Other | Attending: Hematology and Oncology

## 2015-10-28 ENCOUNTER — Ambulatory Visit
Admission: RE | Admit: 2015-10-28 | Discharge: 2015-10-28 | Disposition: A | Discharge status: Home / Self Care | Payer: Medicare Other | Source: Ambulatory Visit | Attending: Hematology and Oncology | Admitting: Hematology and Oncology

## 2015-10-28 ENCOUNTER — Other Ambulatory Visit: Payer: Self-pay | Admitting: *Deleted

## 2015-10-28 ENCOUNTER — Encounter: Payer: Self-pay | Admitting: Hematology and Oncology

## 2015-10-28 ENCOUNTER — Inpatient Hospital Stay (HOSPITAL_BASED_OUTPATIENT_CLINIC_OR_DEPARTMENT_OTHER): Payer: Medicare Other | Admitting: Hematology and Oncology

## 2015-10-28 VITALS — BP 127/74 | HR 62 | Temp 97.6°F | Resp 18 | Wt 179.9 lb

## 2015-10-28 DIAGNOSIS — L723 Sebaceous cyst: Secondary | ICD-10-CM | POA: Diagnosis not present

## 2015-10-28 DIAGNOSIS — C50912 Malignant neoplasm of unspecified site of left female breast: Secondary | ICD-10-CM

## 2015-10-28 DIAGNOSIS — M545 Low back pain, unspecified: Secondary | ICD-10-CM

## 2015-10-28 DIAGNOSIS — M47816 Spondylosis without myelopathy or radiculopathy, lumbar region: Secondary | ICD-10-CM | POA: Insufficient documentation

## 2015-10-28 DIAGNOSIS — Z923 Personal history of irradiation: Secondary | ICD-10-CM | POA: Diagnosis not present

## 2015-10-28 DIAGNOSIS — Z170 Estrogen receptor positive status [ER+]: Secondary | ICD-10-CM

## 2015-10-28 DIAGNOSIS — Z79899 Other long term (current) drug therapy: Secondary | ICD-10-CM

## 2015-10-28 DIAGNOSIS — I10 Essential (primary) hypertension: Secondary | ICD-10-CM

## 2015-10-28 DIAGNOSIS — Z803 Family history of malignant neoplasm of breast: Secondary | ICD-10-CM | POA: Diagnosis not present

## 2015-10-28 DIAGNOSIS — M8580 Other specified disorders of bone density and structure, unspecified site: Secondary | ICD-10-CM | POA: Diagnosis not present

## 2015-10-28 DIAGNOSIS — Z79811 Long term (current) use of aromatase inhibitors: Secondary | ICD-10-CM | POA: Insufficient documentation

## 2015-10-28 DIAGNOSIS — Z880 Allergy status to penicillin: Secondary | ICD-10-CM | POA: Diagnosis not present

## 2015-10-28 LAB — COMPREHENSIVE METABOLIC PANEL
ALT: 12 U/L — ABNORMAL LOW (ref 14–54)
AST: 17 U/L (ref 15–41)
Albumin: 4.1 g/dL (ref 3.5–5.0)
Alkaline Phosphatase: 59 U/L (ref 38–126)
Anion gap: 6 (ref 5–15)
BUN: 29 mg/dL — ABNORMAL HIGH (ref 6–20)
CO2: 26 mmol/L (ref 22–32)
Calcium: 9.5 mg/dL (ref 8.9–10.3)
Chloride: 105 mmol/L (ref 101–111)
Creatinine, Ser: 1.31 mg/dL — ABNORMAL HIGH (ref 0.44–1.00)
GFR calc Af Amer: 44 mL/min — ABNORMAL LOW (ref >60)
GFR calc non Af Amer: 38 mL/min — ABNORMAL LOW (ref >60)
Glucose, Bld: 98 mg/dL (ref 65–99)
Potassium: 3.8 mmol/L (ref 3.5–5.1)
Sodium: 137 mmol/L (ref 135–145)
Total Bilirubin: 0.7 mg/dL (ref 0.3–1.2)
Total Protein: 7 g/dL (ref 6.5–8.1)

## 2015-10-28 LAB — CBC WITH DIFFERENTIAL/PLATELET
Basophils Absolute: 0 10*3/uL (ref 0–0.1)
Basophils Relative: 1 %
Eosinophils Absolute: 0.1 10*3/uL (ref 0–0.7)
Eosinophils Relative: 1 %
HCT: 34.5 % — ABNORMAL LOW (ref 35.0–47.0)
Hemoglobin: 12.2 g/dL (ref 12.0–16.0)
Lymphocytes Relative: 19 %
Lymphs Abs: 0.8 10*3/uL — ABNORMAL LOW (ref 1.0–3.6)
MCH: 32.5 pg (ref 26.0–34.0)
MCHC: 35.5 g/dL (ref 32.0–36.0)
MCV: 91.6 fL (ref 80.0–100.0)
Monocytes Absolute: 0.3 10*3/uL (ref 0.2–0.9)
Monocytes Relative: 7 %
Neutro Abs: 3.2 10*3/uL (ref 1.4–6.5)
Neutrophils Relative %: 72 %
Platelets: 171 10*3/uL (ref 150–440)
RBC: 3.76 MIL/uL — ABNORMAL LOW (ref 3.80–5.20)
RDW: 12.1 % (ref 11.5–14.5)
WBC: 4.4 10*3/uL (ref 3.6–11.0)

## 2015-10-28 NOTE — Progress Notes (Signed)
Patient is here for follow up  

## 2015-10-28 NOTE — Progress Notes (Signed)
[facility] day:  10/28/15  Chief Complaint: Angela Bullock is a 80 y.o. female with stage I left breast cancer who is seen for 6 month assessment.  HPI: The patient was last seen in the medical oncology clinic on 04/28/2015.  At that time, she was was doing well.  Exam was unremarkable.  Mammogram and right ultrasound on 10/17/2015 revealed a 1.8 cm benign cyst within the left breast at the 12 o'clock retroareolar location. There were 2 small sebaceous cysts in the left axillary region.    During the interim, she has done well.  She notes some low back pain.  She is not doing regular monthly exams.  She denies any breast concerns.   Past Medical History:  Diagnosis Date  . Allergy   . Breast cancer ([place]) 01/30/2012   left, radiation and lumpectomy  . Hypertension     Past Surgical History:  Procedure Laterality Date  . ABDOMINAL HYSTERECTOMY    . BREAST BIOPSY Left 01/30/2011   negative  . BREAST BIOPSY Left 01/30/2012   positive  . BREAST CYST ASPIRATION    . BREAST LUMPECTOMY Left 04/02/2012   positive    Family History  Problem Relation Age of Onset  . Breast cancer Paternal Aunt 85  . Osteoporosis Mother   . Heart attack Father     Social History:  reports that she has never smoked. She does not have any smokeless tobacco history on file. She reports that she does not drink alcohol. Her drug history is not on file.  The patient is alone today.  Allergies:  Allergies  Allergen Reactions  . Penicillins Rash    Current Medications: Current Outpatient Prescriptions  Medication Sig Dispense Refill  . atorvastatin (LIPITOR) 10 MG tablet TAKE 1 TABLET BY MOUTH AT BEDTIME 90 tablet 3  . calcium carbonate (OSCAL) 1500 (600 CA) MG TABS tablet Take by mouth 2 (two) times daily with a meal.    . cholecalciferol (VITAMIN D) 1000 UNITS tablet Take 1,000 Units by mouth daily.    Marland Kitchen co-enzyme Q-10 50 MG capsule Take 50 mg by mouth daily.     Marland Kitchen glucosamine-chondroitin 500-400 MG tablet Take 1 tablet by mouth 3 (three) times daily.    Marland Kitchen ibandronate (BONIVA) 150 MG tablet TAKE 1 TABLET BY MOUTH MONTHLY. 3 tablet 3  . lisinopril-hydrochlorothiazide (PRINZIDE,ZESTORETIC) 10-12.5 MG tablet TAKE 1 TABLET BY MOUTH EVERY DAY 90 tablet 3  . metoprolol tartrate (LOPRESSOR) 25 MG tablet TAKE ONE-HALF TABLET BY MOUTH TWICE DAILY 90 tablet 3  . Misc Natural Products (GLUCOSAMINE CHONDROITIN TRIPLE) TABS Take 1 tablet by mouth daily.    . Multiple Vitamins-Minerals (CENTRUM SILVER PO) Take 1 tablet by mouth daily.    . Omega-3 Krill Oil 300 MG CAPS Take 1 capsule by mouth daily.     No current facility-administered medications for this visit.     Review of Systems:  GENERAL:  Feels good.  No fevers or sweats.  Weight stable. PERFORMANCE STATUS (ECOG):  0 HEENT: No visual changes, runny nose, sore throat, mouth sores or tenderness. Lungs: No shortness of breath or cough.  No hemoptysis. Cardiac:  No chest pain, palpitations, orthopnea, or PND. GI:  No nausea, vomiting, diarrhea, constipation, melena or hematochezia. GU:  No urgency, frequency, dysuria, or hematuria. Musculoskeletal:  Back pain.  No joint pain.  No muscle tenderness. Extremities:  No pain or swelling. Skin:  No rashes or skin changes. Neuro:  No headache, numbness or weakness, balance or coordination issues. Endocrine:  No diabetes, thyroid issues, hot flashes or night sweats. Psych:  No mood changes, depression or anxiety. Pain:  Back pain. Review of systems:  All other systems reviewed and found to be negative.  Physical Exam: Blood pressure 127/74, pulse 62, temperature 97.6 F (36.4 C), temperature source Tympanic, resp. rate 18, weight 179 lb 14.3 oz (81.6 kg). GENERAL:  Well developed, well nourished, woman sitting comfortably in the exam room in no acute distress. MENTAL STATUS:  Alert and oriented to person, place and time. HEAD:  Short gray hair.   Normocephalic, atraumatic, face symmetric, no Cushingoid features. EYES:  Blue eyes.  Pupils equal round and reactive to light and accomodation.  No conjunctivitis or scleral icterus. ENT:  Oropharynx clear without lesion.  Tongue normal. Mucous membranes moist.  RESPIRATORY:  Clear to auscultation without rales, wheezes or rhonchi. CARDIOVASCULAR:  Regular rate and rhythm without murmur, rub or gallop. BREAST:  Right breast without masses, skin changes or nipple discharge.  Left breast with well healed incision at the 3 o'clock position.  Left breast without masses, skin changes or nipple discharge.  Bilateral fibrocystic changes. ABDOMEN:  Soft, non-tender, with active bowel sounds, and no appreciable hepatosplenomegaly.  No masses. SKIN:  No rashes, ulcers or lesions. EXTREMITIES: No edema, no skin discoloration or tenderness.  No palpable cords. LYMPH NODES: No palpable cervical, supraclavicular, axillary or inguinal adenopathy  NEUROLOGICAL: Unremarkable. PSYCH:  Appropriate.   Appointment on 10/28/2015  Component Date Value Ref Range Status  . WBC 10/28/2015 4.4  3.6 - 11.0 K/uL Final  . RBC 10/28/2015 3.76* 3.80 - 5.20 MIL/uL Final  . Hemoglobin 10/28/2015 12.2  12.0 - 16.0 g/dL Final  . HCT 10/28/2015 34.5* 35.0 - 47.0 % Final  . MCV 10/28/2015 91.6  80.0 - 100.0 fL Final  . MCH 10/28/2015 32.5  26.0 - 34.0 pg Final  . MCHC 10/28/2015 35.5  32.0 - 36.0 g/dL Final  . RDW 10/28/2015 12.1  11.5 - 14.5 % Final  . Platelets 10/28/2015 171  150 - 440 K/uL Final  . Neutrophils Relative % 10/28/2015 72  % Final  . Neutro Abs 10/28/2015 3.2  1.4 - 6.5 K/uL Final  . Lymphocytes Relative 10/28/2015 19  % Final  . Lymphs Abs 10/28/2015 0.8* 1.0 - 3.6 K/uL Final  . Monocytes Relative 10/28/2015 7  % Final  . Monocytes Absolute 10/28/2015 0.3  0.2 - 0.9 K/uL Final  . Eosinophils Relative 10/28/2015 1  % Final  . Eosinophils Absolute 10/28/2015 0.1  0 - 0.7 K/uL Final  . Basophils Relative  10/28/2015 1  % Final  . Basophils Absolute 10/28/2015 0.0  0 - 0.1 K/uL Final  . Sodium 10/28/2015 137  135 - 145 mmol/L Final  . Potassium 10/28/2015 3.8  3.5 - 5.1 mmol/L Final  . Chloride 10/28/2015 105  101 - 111 mmol/L Final  . CO2 10/28/2015 26  22 - 32 mmol/L Final  . Glucose, Bld 10/28/2015 98  65 - 99 mg/dL Final  . BUN 10/28/2015 29* 6 - 20 mg/dL Final  . Creatinine, Ser 10/28/2015 1.31* 0.44 - 1.00 mg/dL Final  . Calcium 10/28/2015 9.5  8.9 - 10.3 mg/dL Final  . Total Protein 10/28/2015 7.0  6.5 - 8.1 g/dL Final  . Albumin 10/28/2015 4.1  3.5 - 5.0 g/dL Final  . AST 10/28/2015 17  15 - 41 U/L Final  . ALT 10/28/2015 12* 14 - 54 U/L  Final  . Alkaline Phosphatase 10/28/2015 59  38 - 126 U/L Final  . Total Bilirubin 10/28/2015 0.7  0.3 - 1.2 mg/dL Final  . GFR calc non Af Amer 10/28/2015 38* >60 mL/min Final  . GFR calc Af Amer 10/28/2015 44* >60 mL/min Final   Comment: (NOTE) The eGFR has been calculated using the CKD EPI equation. This calculation has not been validated in all clinical situations. eGFR's persistently <60 mL/min signify possible Chronic Kidney Disease.   . Anion gap 10/28/2015 6  5 - 15 Final    Assessment:  Aylee E Guttman is a 80 y.o. female with a stage I left breast cancer status post excisional biopsy on 04/02/2012. Pathology revealed a 1.3 cm moderately differentiated basal cell carcinoma. There was no DCIS or vascular invasion. Tumor extended to the lateral margin.  Pathology was ER +100%, PR +73% and HER-2/neu 1+.  She then underwent left partial mastectomy with sentinel lymph node biopsy on 05/02/2012.  Pathology revealed no evidence of residual malignancy. Margins were negative. Zero of 6 sentinel lymph nodes were positive. Pathologic stage was [id number].  She underwent Mammosite radiation. She began Arimidex on 05/2012. She took Arimidex for approximately 1 year. She discontinued Arimidex in 05/2013 as she did not like the side effect profile.  She  decided against any other hormonal therapy.   Mammogram and right ultrasound on 10/17/2015 revealed a 1.8 cm benign cyst within the left breast at the 12 o'clock retroareolar location.    CA27.29 was 33.7 on 10/21/2014, 26.1 on 04/28/2015, and 23.2 on 10/28/2015.  Bone density study on 10/08/2012 revealed osteopenia (T score -1.8 in left femoral neck).  Bone density study on 10/14/2014 revealed a T-score of -1.7 in the right femur neck.  She is on calcium and vitamin D.  Symptomatically, she has some low back pain.  Exam is stable.  Plan: 1.  Labs today:  CBC with diff, CMP, CA27.29. 2.  Review last mammogram.  Per radiology, next mammogram in 1 year. 3.  Review monthly self exams. 4.  Plain films of lumbar spine. 5.  RTC in 6 months for MD assessment and labs (CBC with diff, CMP, CA27.29).   Lequita Asal, MD  10/28/2015, 11:29 AM

## 2015-10-29 ENCOUNTER — Encounter: Payer: Self-pay | Admitting: Hematology and Oncology

## 2015-10-29 LAB — CANCER ANTIGEN 27.29: CA 27.29: 23.2 U/mL (ref 0.0–38.6)

## 2015-11-08 ENCOUNTER — Ambulatory Visit (INDEPENDENT_AMBULATORY_CARE_PROVIDER_SITE_OTHER): Payer: Medicare Other | Admitting: Family Medicine

## 2015-11-08 ENCOUNTER — Encounter: Payer: Self-pay | Admitting: Family Medicine

## 2015-11-08 VITALS — BP 108/62 | HR 60 | Temp 98.0°F | Resp 16 | Ht 63.0 in | Wt 178.0 lb

## 2015-11-08 DIAGNOSIS — Z853 Personal history of malignant neoplasm of breast: Secondary | ICD-10-CM | POA: Diagnosis not present

## 2015-11-08 DIAGNOSIS — N183 Chronic kidney disease, stage 3 unspecified: Secondary | ICD-10-CM | POA: Insufficient documentation

## 2015-11-08 DIAGNOSIS — M150 Primary generalized (osteo)arthritis: Secondary | ICD-10-CM

## 2015-11-08 DIAGNOSIS — G8929 Other chronic pain: Secondary | ICD-10-CM | POA: Diagnosis not present

## 2015-11-08 DIAGNOSIS — Z13220 Encounter for screening for lipoid disorders: Secondary | ICD-10-CM

## 2015-11-08 DIAGNOSIS — I129 Hypertensive chronic kidney disease with stage 1 through stage 4 chronic kidney disease, or unspecified chronic kidney disease: Secondary | ICD-10-CM | POA: Insufficient documentation

## 2015-11-08 DIAGNOSIS — R7309 Other abnormal glucose: Secondary | ICD-10-CM

## 2015-11-08 DIAGNOSIS — M545 Low back pain, unspecified: Secondary | ICD-10-CM | POA: Insufficient documentation

## 2015-11-08 DIAGNOSIS — M159 Polyosteoarthritis, unspecified: Secondary | ICD-10-CM | POA: Insufficient documentation

## 2015-11-08 DIAGNOSIS — M25561 Pain in right knee: Secondary | ICD-10-CM | POA: Diagnosis not present

## 2015-11-08 DIAGNOSIS — I10 Essential (primary) hypertension: Secondary | ICD-10-CM | POA: Diagnosis not present

## 2015-11-08 DIAGNOSIS — M25562 Pain in left knee: Secondary | ICD-10-CM | POA: Diagnosis not present

## 2015-11-08 DIAGNOSIS — Z131 Encounter for screening for diabetes mellitus: Secondary | ICD-10-CM

## 2015-11-08 MED ORDER — DICLOFENAC SODIUM 50 MG PO TBEC: DELAYED_RELEASE_TABLET | ORAL | 1 refills | Status: DC

## 2015-11-08 NOTE — Assessment & Plan Note (Signed)
Stable, without history of recurrence Continue regular mammogram screening

## 2015-11-08 NOTE — Assessment & Plan Note (Signed)
Baseline SCr 1.1 to 1.3 with recent gradual increase, suspected due to advanced age and underlying chronic HTN. Concern with recent NSAID use as well. - Advised to DC Aleve, improve hydration - Start regular Tylenol regimen, and only reserve new rx NSAID Diclofenac only for 1-2 weeks as needed PRN flares. Future consider topical NSAID voltaren to limit affect on kidneys - Ordered future BMET to follow Cr within next 3 mo with additional labs, including A1c screening

## 2015-11-08 NOTE — Assessment & Plan Note (Signed)
Likely underlying etiology for intermittent gradual worsening flares low back pain and knee pain. - Recent Lumbar spine imaging with DJD, reviewed images, see result data - Treat with regular Tylenol, Diclofenac only PRN flares (future consider topical), heat / ice, conservative therapies - Follow-up

## 2015-11-08 NOTE — Assessment & Plan Note (Signed)
Well controlled on current regimen No changes today, continue meds

## 2015-11-08 NOTE — Progress Notes (Signed)
Subjective:    Patient ID: Angela Bullock, female    DOB: 10/28/1935, 80 y.o.   MRN: [id number]  Angela Bullock is a 80 y.o. female presenting on 11/08/2015 for Establish Care (main concerned is knee and back pain getting worst now)  Last established with [facility] back in 2015.   HPI  BILATERAL KNEE PAIN - Chronic history of bilateral knee pain for past 3 years, initial symptoms began about 24 hours after "kicking" with right leg to help clear yard of [place], denied any acute injury, trauma or fall. Described - Reported recent flare of bilateral knee pain and swelling onset 2-3 days ago, only inciting event was helping husband pull a wagon full of yard debris, no initial injury, but gradual worsening pain hours later, pain up to 7-8/10, aching pain associated with swelling bilateral similar, pain worse with sleeping, standing, walking. Tried ASA 325 x 2 pills q 4 hr for 1.5 days and Blue Emu, no topical ice or heat, but just recently got a knee sleeve has not started using. - No recent imaging of knees. No formal dx of knee arthritis but was advised she may have this problem in future several years ago, prior treatment with Diclofenac and did well with this.  BACK PAIN, Chronic - Reports chronic intermittent course of low back pain without sciatica symptoms, no known prior injury trauma or fall. Course is gradual with intermittent flares, mostly infrequent about 1x month or less. No prior formal back diagnosis or problems before. No surgery or injections. Recently saw Oncologist had Lumbar X-rays ordered due to some recent pain, see below have reviewed results with DJD. - Recently took Aleve OTC regularly for past 3-4 weeks about twice a day - Not taking Tylenol - Interested in back brace for some household activities with increased bending/twisting - Denies any fevers/chills, numbness, tingling, weakness, loss of control bladder/bowel incontinence or retention, unintentional wt loss, night  sweats  CHRONIC HTN: Reports no recent problems, has been controlled. Current Meds - Metoprolol 12.5mg  (half of 25mg  tab) BID, Lisinopril-HCTZ 20-25mg  daily   Reports good compliance, took meds today. Tolerating well, w/o complaints. Denies CP, dyspnea, HA, edema, dizziness / lightheadedness   H/o Breast Cancer - Dx 2014, s/p lumpectomy, radiation therapy - Surveillance, mammogram yearly, no recurrence or problems  Past Medical History:  Diagnosis Date  . Allergy   . Breast cancer ([place]) 01/30/2012   left, radiation and lumpectomy  . Hypertension    Social History   Social History  . Marital status: Married    Spouse name: N/A  . Number of children: N/A  . Years of education: N/A   Occupational History  . Not on file.   Social History Main Topics  . Smoking status: Never Smoker  . Smokeless tobacco: Never Used  . Alcohol use No  . Drug use: No  . Sexual activity: Not on file   Other Topics Concern  . Not on file   Social History Narrative  . No narrative on file   Family History  Problem Relation Age of Onset  . Breast cancer Paternal Aunt 85  . Osteoporosis Mother   . Heart attack Father    Current Outpatient Prescriptions on File Prior to Visit  Medication Sig  . atorvastatin (LIPITOR) 10 MG tablet TAKE 1 TABLET BY MOUTH AT BEDTIME  . calcium carbonate (OSCAL) 1500 (600 CA) MG TABS tablet Take by mouth 2 (two) times daily with a meal.  . cholecalciferol (VITAMIN D)  1000 UNITS tablet Take 1,000 Units by mouth daily.  Marland Kitchen co-enzyme Q-10 50 MG capsule Take 50 mg by mouth daily.  Marland Kitchen glucosamine-chondroitin 500-400 MG tablet Take 1 tablet by mouth 3 (three) times daily.  Marland Kitchen ibandronate (BONIVA) 150 MG tablet TAKE 1 TABLET BY MOUTH MONTHLY.  Marland Kitchen lisinopril-hydrochlorothiazide (PRINZIDE,ZESTORETIC) 10-12.5 MG tablet TAKE 1 TABLET BY MOUTH EVERY DAY  . metoprolol tartrate (LOPRESSOR) 25 MG tablet TAKE ONE-HALF TABLET BY MOUTH TWICE DAILY  . Misc Natural Products  (GLUCOSAMINE CHONDROITIN TRIPLE) TABS Take 1 tablet by mouth daily.  . Multiple Vitamins-Minerals (CENTRUM SILVER PO) Take 1 tablet by mouth daily.  . Omega-3 Krill Oil 300 MG CAPS Take 1 capsule by mouth daily.   No current facility-administered medications on file prior to visit.     Review of Systems  Constitutional: Negative for activity change, appetite change, chills, diaphoresis, fatigue and fever.  HENT: Negative for congestion and hearing loss.   Eyes: Negative for visual disturbance.  Respiratory: Negative for cough, chest tightness and shortness of breath.   Cardiovascular: Negative for chest pain and leg swelling.  Gastrointestinal: Negative for abdominal pain, constipation, diarrhea, nausea and vomiting.  Musculoskeletal: Positive for arthralgias and back pain. Negative for gait problem, joint swelling (none actively), neck pain and neck stiffness.  Skin: Negative for rash.  Neurological: Negative for weakness, numbness and headaches.   Per HPI unless specifically indicated above     Objective:    BP 108/62   Pulse 60   Temp 98 F (36.7 C) (Oral)   Resp 16   Ht 5\' 3"  (1.6 m)   Wt 178 lb (80.7 kg)   BMI 31.53 kg/m   Wt Readings from Last 3 Encounters:  11/08/15 178 lb (80.7 kg)  10/28/15 179 lb 14.3 oz (81.6 kg)  04/28/15 179 lb 14.3 oz (81.6 kg)    Physical Exam  Constitutional: She is oriented to person, place, and time. She appears well-developed and well-nourished. No distress.  Well-appearing, comfortable, cooperative  HENT:  Head: Normocephalic and atraumatic.  Mouth/Throat: Oropharynx is clear and moist.  Cardiovascular: Normal rate, regular rhythm, normal heart sounds and intact distal pulses.   No murmur heard. Pulmonary/Chest: Effort normal and breath sounds normal. No respiratory distress. She has no wheezes. She has no rales.  Musculoskeletal: Normal range of motion. She exhibits no edema.  Bilateral Knees Inspection: Normal appearance and  symmetrical. Mild bulkiness bilateral knees with some medial fullness. No ecchymosis or effusion. Palpation: Non-tender medial/lateral joint line. Mild bilateral crepitus ROM: Full active ROM bilaterally Strength: 5/5 intact knee flex/ext, ankle dorsi/plantarflex Neurovascular: distally intact sensation light touch and pulses  Low Back Inspection: Normal appearance, no spinal deformity, symmetrical. Palpation: No tenderness over spinous processes. Bilateral lumbar paraspinal muscles non-tender and without hypertonicity/spasm. ROM: Full active ROM forward flex / back extension, rotation L/R without discomfort Special Testing: Seated SLR negative for radicular pain bilaterally Strength: Bilateral hip flex/ext 5/5, knee flex/ext 5/5, ankle dorsiflex/plantarflex 5/5  Neurological: She is alert and oriented to person, place, and time.  Skin: Skin is warm and dry. No rash noted. She is not diaphoretic.  Psychiatric: She has a normal mood and affect.  Nursing note and vitals reviewed.     I have reviewed recent lab results from 10/28/15.  Results for orders placed or performed in visit on 10/28/15  CBC with Differential/Platelet  Result Value Ref Range   WBC 4.4 3.6 - 11.0 K/uL   RBC 3.76 (L) 3.80 - 5.20 MIL/uL  Hemoglobin 12.2 12.0 - 16.0 g/dL   HCT 34.5 (L) 35.0 - 47.0 %   MCV 91.6 80.0 - 100.0 fL   MCH 32.5 26.0 - 34.0 pg   MCHC 35.5 32.0 - 36.0 g/dL   RDW 12.1 11.5 - 14.5 %   Platelets 171 150 - 440 K/uL   Neutrophils Relative % 72 %   Neutro Abs 3.2 1.4 - 6.5 K/uL   Lymphocytes Relative 19 %   Lymphs Abs 0.8 (L) 1.0 - 3.6 K/uL   Monocytes Relative 7 %   Monocytes Absolute 0.3 0.2 - 0.9 K/uL   Eosinophils Relative 1 %   Eosinophils Absolute 0.1 0 - 0.7 K/uL   Basophils Relative 1 %   Basophils Absolute 0.0 0 - 0.1 K/uL  Comprehensive metabolic panel  Result Value Ref Range   Sodium 137 135 - 145 mmol/L   Potassium 3.8 3.5 - 5.1 mmol/L   Chloride 105 101 - 111 mmol/L   CO2  26 22 - 32 mmol/L   Glucose, Bld 98 65 - 99 mg/dL   BUN 29 (H) 6 - 20 mg/dL   Creatinine, Ser 1.31 (H) 0.44 - 1.00 mg/dL   Calcium 9.5 8.9 - 10.3 mg/dL   Total Protein 7.0 6.5 - 8.1 g/dL   Albumin 4.1 3.5 - 5.0 g/dL   AST 17 15 - 41 U/L   ALT 12 (L) 14 - 54 U/L   Alkaline Phosphatase 59 38 - 126 U/L   Total Bilirubin 0.7 0.3 - 1.2 mg/dL   GFR calc non Af Amer 38 (L) >60 mL/min   GFR calc Af Amer 44 (L) >60 mL/min   Anion gap 6 5 - 15  Cancer antigen 27.29  Result Value Ref Range   CA 27.29 23.2 0.0 - 38.6 U/mL    I have reviewed images and radiology report from recent Lumbar x-ray 10/28/15 per Oncology orders. My interpretation is with notable lower lumbar L4-L5 facet hypertrophy with degeneration, and several levels of loss of disc space with narrowing. Agree with radiology below.  FINDINGS: Probable 2.6 cm gallstone. Convex right lumbar spine curvature. Five lumbar type vertebral bodies. Sacroiliac joints are symmetric. Maintenance of vertebral body height and alignment. Multilevel intervertebral disc height loss with facet arthropathy. Most significant at L4-5 and L5-S1.  IMPRESSION: Advanced lumbar spondylosis with convex right curvature.      Assessment & Plan:   Problem List Items Addressed This Visit    Osteoarthritis, multiple sites - Primary    Likely underlying etiology for intermittent gradual worsening flares low back pain and knee pain. - Recent Lumbar spine imaging with DJD, reviewed images, see result data - Treat with regular Tylenol, Diclofenac only PRN flares (future consider topical), heat / ice, conservative therapies - Follow-up      Relevant Medications   diclofenac (VOLTAREN) 50 MG EC tablet   Hypertension    Well controlled on current regimen No changes today, continue meds      Relevant Orders   BASIC METABOLIC PANEL WITH GFR   Lipid panel   Hemoglobin A1c   History of breast cancer    Stable, without history of recurrence Continue  regular mammogram screening      CKD (chronic kidney disease) stage 3, GFR 30-59 ml/min    Baseline SCr 1.1 to 1.3 with recent gradual increase, suspected due to advanced age and underlying chronic HTN. Concern with recent NSAID use as well. - Advised to DC Aleve, improve hydration - Start regular Tylenol  regimen, and only reserve new rx NSAID Diclofenac only for 1-2 weeks as needed PRN flares. Future consider topical NSAID voltaren to limit affect on kidneys - Ordered future BMET to follow Cr within next 3 mo with additional labs, including A1c screening      Relevant Orders   BASIC METABOLIC PANEL WITH GFR   Chronic low back pain without sciatica    Stable, without active pain currently. Chronic intermittent midline R>L low back pain without sciatica. No known trauma or prior injury. - Last imaging Lumbar X-ray 10/28/15 with multi level DJD, reviewed - No red flag symptoms. Negative SLR for radiculopathy - Inadequate conservative therapy  Plan: 1. Start regular Tylenol, then given rx Diclofenac PRN flares 2. Reviewed conservative therapies, important to maximize, may use heat, stay active, may consider back brace for inc activity 3. Follow-up may need PT exercises      Relevant Medications   diclofenac (VOLTAREN) 50 MG EC tablet   Bilateral chronic knee pain    Consistent with stable chronic bilateral knee OA by history and exam. No evidence of instability or mechanical locking symptoms. No recent imaging, known OA/DJD in Lumbar spine. No significant knee treatments. - Start with regular Tylenol - Rx Diclofenac 50 BID wc PRN 1-2 weeks for flares only, (DC Aleve) - Stay active, regular walking, knee compression and Bullock as needed - Follow-up 3 months, consider future topical NSAID, standing knee x-rays, in future if progressing consider injections      Relevant Medications   diclofenac (VOLTAREN) 50 MG EC tablet    Other Visit Diagnoses    Abnormal glucose       Relevant Orders    Hemoglobin A1c   Screening for diabetes mellitus       Given age, no prior A1c, and prior abnormral glucose. Check A1c   Relevant Orders   Hemoglobin A1c   Screening cholesterol level       Relevant Orders   Lipid panel      Meds ordered this encounter  Medications  . diclofenac (VOLTAREN) 50 MG EC tablet    Sig: Take 1 tab twice a day with food for up to 1-2 weeks for flare, then use as needed.    Dispense:  30 tablet    Refill:  1      Follow up plan: Return in about 3 months (around 02/08/2016) for labs, arthritis, HTN.  Nobie Putnam, DO [facility] 11/08/2015, 5:44 PM

## 2015-11-08 NOTE — Assessment & Plan Note (Signed)
Consistent with stable chronic bilateral knee OA by history and exam. No evidence of instability or mechanical locking symptoms. No recent imaging, known OA/DJD in Lumbar spine. No significant knee treatments. - Start with regular Tylenol - Rx Diclofenac 50 BID wc PRN 1-2 weeks for flares only, (DC Aleve) - Stay active, regular walking, knee compression and RICE as needed - Follow-up 3 months, consider future topical NSAID, standing knee x-rays, in future if progressing consider injections

## 2015-11-08 NOTE — Assessment & Plan Note (Signed)
Stable, without active pain currently. Chronic intermittent midline R>L low back pain without sciatica. No known trauma or prior injury. - Last imaging Lumbar X-ray 10/28/15 with multi level DJD, reviewed - No red flag symptoms. Negative SLR for radiculopathy - Inadequate conservative therapy  Plan: 1. Start regular Tylenol, then given rx Diclofenac PRN flares 2. Reviewed conservative therapies, important to maximize, may use heat, stay active, may consider back brace for inc activity 3. Follow-up may need PT exercises

## 2015-11-08 NOTE — Patient Instructions (Signed)
Thank you for coming in to clinic today.  1. You have osteoarthritis or wear and tear of cartilage and joints in both knees and low back. - We can consider x-rays of knees in future if worsening, but no need at the moment  For regular daily aches and pains, start with routine tylenol dosing - Recommend to start taking Tylenol Extra Strength 500mg  tabs - take 1 to 2 tabs (max 1000mg  per dose) every 6 hours for pain (take regularly, don't skip a dose for next 3-7 days), max 24 hour daily dose is 6 tablets or 3000mg . - Stop Aleve. Do not take ibuprofen as well. - If you have a flare of pain, start Diclofenac 50mg  twice a day with food every day for 1-2 weeks at a time then stop for several weeks, caution of harming kidneys if take too long.  Important to stay active but not overdo it. Gentle walking is best. Use compression knee sleeve, ice packs and elevation for knee swelling from recent flare up. If chronic or persistent aching, especially low back you can use heating pad or moist heat.  You will be due for fasting blood work before next visit in 3 months. - Schedule a Lab Only apt to get blood drawn in morning (fasting no food or drink after midnight before) about 1-2 weeks before your next scheduled appointment  Please schedule a follow-up appointment with Dr. Parks Ranger in 3 months for follow-up Labs, Arthritis, HTN  If you have any other questions or concerns, please feel free to call the clinic or send a message through [place]. You may also schedule an earlier appointment if necessary.  Nobie Putnam, DO [place]

## 2015-11-22 ENCOUNTER — Other Ambulatory Visit: Payer: Self-pay | Admitting: Family Medicine

## 2015-12-20 ENCOUNTER — Other Ambulatory Visit: Payer: Self-pay | Admitting: Family Medicine

## 2016-02-02 ENCOUNTER — Other Ambulatory Visit: Payer: Medicare Other

## 2016-02-07 ENCOUNTER — Other Ambulatory Visit: Payer: Medicare Other

## 2016-02-07 ENCOUNTER — Other Ambulatory Visit: Payer: Self-pay | Admitting: *Deleted

## 2016-02-07 DIAGNOSIS — Z131 Encounter for screening for diabetes mellitus: Secondary | ICD-10-CM | POA: Diagnosis not present

## 2016-02-07 DIAGNOSIS — R7309 Other abnormal glucose: Secondary | ICD-10-CM | POA: Diagnosis not present

## 2016-02-07 DIAGNOSIS — I10 Essential (primary) hypertension: Secondary | ICD-10-CM

## 2016-02-07 DIAGNOSIS — Z13220 Encounter for screening for lipoid disorders: Secondary | ICD-10-CM | POA: Diagnosis not present

## 2016-02-07 DIAGNOSIS — N183 Chronic kidney disease, stage 3 unspecified: Secondary | ICD-10-CM

## 2016-02-08 ENCOUNTER — Encounter: Payer: Self-pay | Admitting: Family Medicine

## 2016-02-08 ENCOUNTER — Ambulatory Visit (INDEPENDENT_AMBULATORY_CARE_PROVIDER_SITE_OTHER): Payer: Medicare Other | Admitting: Family Medicine

## 2016-02-08 VITALS — BP 100/60 | HR 57 | Temp 98.2°F | Resp 16 | Ht 63.0 in | Wt 184.0 lb

## 2016-02-08 DIAGNOSIS — I10 Essential (primary) hypertension: Secondary | ICD-10-CM

## 2016-02-08 DIAGNOSIS — M150 Primary generalized (osteo)arthritis: Secondary | ICD-10-CM | POA: Diagnosis not present

## 2016-02-08 DIAGNOSIS — G8929 Other chronic pain: Secondary | ICD-10-CM | POA: Diagnosis not present

## 2016-02-08 DIAGNOSIS — M25561 Pain in right knee: Secondary | ICD-10-CM | POA: Diagnosis not present

## 2016-02-08 DIAGNOSIS — M25562 Pain in left knee: Secondary | ICD-10-CM | POA: Diagnosis not present

## 2016-02-08 DIAGNOSIS — N183 Chronic kidney disease, stage 3 unspecified: Secondary | ICD-10-CM

## 2016-02-08 DIAGNOSIS — M159 Polyosteoarthritis, unspecified: Secondary | ICD-10-CM

## 2016-02-08 LAB — LIPID PANEL
CHOLESTEROL: 139 mg/dL (ref <200)
HDL: 59 mg/dL (ref >50)
LDL Cholesterol: 56 mg/dL (ref <100)
TRIGLYCERIDES: 120 mg/dL (ref <150)
Total CHOL/HDL Ratio: 2.4 Ratio (ref <5.0)
VLDL: 24 mg/dL (ref <30)

## 2016-02-08 LAB — HEMOGLOBIN A1C
Hgb A1c MFr Bld: 5.4 % (ref <5.7)
Mean Plasma Glucose: 108 mg/dL

## 2016-02-08 LAB — BASIC METABOLIC PANEL WITH GFR
BUN: 34 mg/dL — ABNORMAL HIGH (ref 7–25)
CALCIUM: 10 mg/dL (ref 8.6–10.4)
CO2: 24 mmol/L (ref 20–31)
CREATININE: 1.31 mg/dL — AB (ref 0.60–0.93)
Chloride: 104 mmol/L (ref 98–110)
GFR, EST AFRICAN AMERICAN: 45 mL/min — AB (ref >=60)
GFR, EST NON AFRICAN AMERICAN: 39 mL/min — AB (ref >=60)
Glucose, Bld: 97 mg/dL (ref 65–99)
Potassium: 4.5 mmol/L (ref 3.5–5.3)
SODIUM: 141 mmol/L (ref 135–146)

## 2016-02-08 MED ORDER — ASPIRIN EC 81 MG PO TBEC: 81.0000 mg | DELAYED_RELEASE_TABLET | Freq: Every day | ORAL | Status: DC

## 2016-02-08 MED ORDER — DICLOFENAC SODIUM 1 % TD GEL: 2.0000 g | Freq: Three times a day (TID) | TRANSDERMAL | 5 refills | Status: DC | PRN

## 2016-02-08 MED ORDER — LISINOPRIL 10 MG PO TABS: 10.0000 mg | ORAL_TABLET | Freq: Every day | ORAL | 3 refills | Status: DC

## 2016-02-08 NOTE — Assessment & Plan Note (Signed)
Well controlled, today lower BP, no outside readings, asymptomatic. Complicated by CKD-III, chronic seems less likely due to NSAIDs now off no improvement  Plan: 1. Given low BP and CKD, patient discussion today on meds, decision to STOP HCTZ 12.5mg  daily, she was taking combo pill so now start single new rx for Lisinopril 10mg  daily, continue Metoprolol 12.5mg  BID (no history of MI or other etiology to require beta blocker) 2. Check BP outside of office, return sooner to re-check if elevated or too low, bring readings 3. Encouraged lifestyle modifications low salt diet, stay active 4. Follow-up Nurse Only visit BP check and calibrate home cuff in 2-4 weeks 5. Follow-up 6 months for BP check BMET for Cr trend, non fasting lab, future order placed

## 2016-02-08 NOTE — Assessment & Plan Note (Signed)
Likely underlying etiology for intermittent gradual worsening flares low back pain and knee pain. - Recent imaging only Lumbar spine - No Knee x-rays - Switch from oral NSAIDs, to topical Diclofenac PRN for knees

## 2016-02-08 NOTE — Progress Notes (Signed)
Subjective:    Patient ID: Angela Bullock, female    DOB: Jun 22, 1935, 81 y.o.   MRN: [id number]  Angela Bullock is a 81 y.o. female presenting on 02/08/2016 for Hypertension  HPI  BILATERAL KNEE PAIN, History of Osteoarthritis / Chronic LBP with DJD - Last visit with me 11/08/15, to re-establish care, discussed her osteoarthritis history in detail at that time, see note for background info, history of known OA/DJD in back, and suspected OA/DJD in knees - Today overall reports she is doing well. Describes her chronic bilateral knee pain is stable without significant improvement or worsening. She has remained off of Aleve since last visit due to diagnosis of CKD. She was given Diclofenac PRN, and only to use if acute flare, has only tried this a few times, 50mg  up to BID some relief. Also taking Tylenol 500mg  1-2 tabs per dose few times daily more regularly, does not take everyday, doing well with this. - Admits occasional swelling but less frequent - Interested in topical voltaren trial as discussed before, especially given CKD - No recent imaging of knees, would like to hold off on this for now - Denies new injury, trauma, fall, swelling, redness, other joint pain  CKD-III - Reports no new concerns. Reviewed recent labs prior to visit with Cr stable at 1.31 for past 1 year. She has chronic hstory of HTN, and thought that this was primary contributing factor. - She has remained off NSAID Aleve since last visit, only very limited use of Diclofenac, now switching to topical - Denies any problem with voiding, dysuria, hematuria, reduced urinary output  CHRONIC HTN: Reports no recent problems, does not check BP at home, but can start checking. Has not noticed any symptoms of High BP or symptoms of Low BP - Interested in discontinuing HCTZ - Current Meds - Metoprolol 12.5mg  (half of 25mg  tab) BID, Lisinopril-HCTZ 10-12.5mg  daily   Reports good compliance, took meds today. Tolerating well, w/o  complaints. Denies CP, dyspnea, HA, edema, dizziness / lightheadedness   Social History  Substance Use Topics  . Smoking status: Never Smoker  . Smokeless tobacco: Never Used  . Alcohol use No    Review of Systems   Per HPI unless specifically indicated above     Objective:    BP 100/60 (BP Location: Right Arm, Patient Position: Sitting, Cuff Size: Normal)   Pulse (!) 57   Temp 98.2 F (36.8 C) (Oral)   Resp 16   Ht 5\' 3"  (1.6 m)   Wt 184 lb (83.5 kg)   BMI 32.59 kg/m   Wt Readings from Last 3 Encounters:  02/08/16 184 lb (83.5 kg)  11/08/15 178 lb (80.7 kg)  10/28/15 179 lb 14.3 oz (81.6 kg)    Physical Exam  Constitutional: She appears well-developed and well-nourished. No distress.  Well-appearing, comfortable, cooperative  HENT:  Head: Normocephalic and atraumatic.  Mouth/Throat: Oropharynx is clear and moist.  Eyes: Conjunctivae are normal.  Cardiovascular: Normal rate, regular rhythm, normal heart sounds and intact distal pulses.   No murmur heard. Pulmonary/Chest: Effort normal.  Musculoskeletal: She exhibits no edema.  Bilateral Knees - Unchanged from last exam Inspection: Normal appearance and symmetrical. Mild bulkiness bilateral knees. No ecchymosis or effusion. Palpation: Non-tender medial/lateral joint line. Mild bilateral crepitus ROM: Full active ROM bilaterally Strength: 5/5 intact knee flex/ext, ankle dorsi/plantarflex Neurovascular: distally intact sensation light touch and pulses  Neurological: She is alert.  Skin: Skin is warm and dry. She is not diaphoretic.  Nursing note and vitals reviewed.   I have reviewed recent lab results from 02/07/16.  Results for orders placed or performed in visit on [id number]  BASIC METABOLIC PANEL WITH GFR  Result Value Ref Range   Sodium 141 135 - 146 mmol/L   Potassium 4.5 3.5 - 5.3 mmol/L   Chloride 104 98 - 110 mmol/L   CO2 24 20 - 31 mmol/L   Glucose, Bld 97 65 - 99 mg/dL   BUN 34 (H) 7 - 25 mg/dL    Creat 1.31 (H) 0.60 - 0.93 mg/dL   Calcium 10.0 8.6 - 10.4 mg/dL   GFR, Est African American 45 (L) >=60 mL/min   GFR, Est Non African American 39 (L) >=60 mL/min  Lipid panel  Result Value Ref Range   Cholesterol 139 <200 mg/dL   Triglycerides 120 <150 mg/dL   HDL 59 >50 mg/dL   Total CHOL/HDL Ratio 2.4 <5.0 Ratio   VLDL 24 <30 mg/dL   LDL Cholesterol 56 <100 mg/dL  Hemoglobin A1c  Result Value Ref Range   Hgb A1c MFr Bld 5.4 <5.7 %   Mean Plasma Glucose 108 mg/dL      Assessment & Plan:   Problem List Items Addressed This Visit    Osteoarthritis, multiple sites    Likely underlying etiology for intermittent gradual worsening flares low back pain and knee pain. - Recent imaging only Lumbar spine - No Knee x-rays - Switch from oral NSAIDs, to topical Diclofenac PRN for knees      Relevant Medications   diclofenac sodium (VOLTAREN) 1 % GEL   aspirin EC 81 MG tablet   Hypertension - Primary    Well controlled, today lower BP, no outside readings, asymptomatic. Complicated by CKD-III, chronic seems less likely due to NSAIDs now off no improvement  Plan: 1. Given low BP and CKD, patient discussion today on meds, decision to STOP HCTZ 12.5mg  daily, she was taking combo pill so now start single new rx for Lisinopril 10mg  daily, continue Metoprolol 12.5mg  BID (no history of MI or other etiology to require beta blocker) 2. Check BP outside of office, return sooner to re-check if elevated or too low, bring readings 3. Encouraged lifestyle modifications low salt diet, stay active 4. Follow-up Nurse Only visit BP check and calibrate home cuff in 2-4 weeks 5. Follow-up 6 months for BP check BMET for Cr trend, non fasting lab, future order placed      Relevant Medications   lisinopril (PRINIVIL,ZESTRIL) 10 MG tablet   aspirin EC 81 MG tablet   Other Relevant Orders   BASIC METABOLIC PANEL WITH GFR   CKD (chronic kidney disease) stage 3, GFR 30-59 ml/min    Stable chronic CKD-III  with baseline Cr approx 1.3, unchanged in 3-4 months after stop Aleve oral NSAIDs, only rarely use Diclofenac PO, suspected secondary to chronic HTN and age. Last A1c 5.4 normal, no evidence DM affecting kidneys.  Plan: 1. Remain off oral NSAIDs, very cautious occasional use may try Diclofenac, switch to topical Voltaren for knee arthritis, continue regular Tylenol dosing instead 2. Improve hydration 3. Control BP,currently well controlled, now with low BP, stopping HCTZ may not be needed 4. Follow-up 6 months repeat BMET follow Cr      Relevant Orders   BASIC METABOLIC PANEL WITH GFR   Bilateral chronic knee pain    Stable chronic bilateral knee arthritis secondary to suspected osteoarthritis / DJD - No evidence of complication, trauma, injury. No evidence of instability or  mechanical locking - No prior X-rays of knees or other imaging, no prior injections or surgery or other therapy  Plan: 1. Continue on current course with conservative therapy, seems to be effective. Remain off regular NSAID, use Tylenol Ext Str regularly 500-1000mg  TID PRN or daily, can use Diclofenac PO only rarely for acute flare. 2. Switch PO NSAID to Diclofenac topical gel 2g TID daily vs PRN for knees 3. Can continue supplement glucosamine 4. Stay active, regular walking, knee compression and Bullock as needed 5. Follow-up 6 months - do recommend standing knee x-rays, in future if progressing consider injections. Today patient declined X-rays      Relevant Medications   diclofenac sodium (VOLTAREN) 1 % GEL   aspirin EC 81 MG tablet      Meds ordered this encounter  Medications  . diclofenac sodium (VOLTAREN) 1 % GEL    Sig: Apply 2 g topically 3 (three) times daily as needed.    Dispense:  100 g    Refill:  5  . lisinopril (PRINIVIL,ZESTRIL) 10 MG tablet    Sig: Take 1 tablet (10 mg total) by mouth daily.    Dispense:  90 tablet    Refill:  3  . aspirin EC 81 MG tablet    Sig: Take 1 tablet (81 mg total)  by mouth daily.    Follow up plan: Return in about 6 months (around 08/07/2016) for blood pressure.  Nobie Putnam, [facility] 02/08/2016, 1:22 PM

## 2016-02-08 NOTE — Assessment & Plan Note (Signed)
Stable chronic CKD-III with baseline Cr approx 1.3, unchanged in 3-4 months after stop Aleve oral NSAIDs, only rarely use Diclofenac PO, suspected secondary to chronic HTN and age. Last A1c 5.4 normal, no evidence DM affecting kidneys.  Plan: 1. Remain off oral NSAIDs, very cautious occasional use may try Diclofenac, switch to topical Voltaren for knee arthritis, continue regular Tylenol dosing instead 2. Improve hydration 3. Control BP,currently well controlled, now with low BP, stopping HCTZ may not be needed 4. Follow-up 6 months repeat BMET follow Cr

## 2016-02-08 NOTE — Assessment & Plan Note (Signed)
Stable chronic bilateral knee arthritis secondary to suspected osteoarthritis / DJD - No evidence of complication, trauma, injury. No evidence of instability or mechanical locking - No prior X-rays of knees or other imaging, no prior injections or surgery or other therapy  Plan: 1. Continue on current course with conservative therapy, seems to be effective. Remain off regular NSAID, use Tylenol Ext Str regularly 500-1000mg  TID PRN or daily, can use Diclofenac PO only rarely for acute flare. 2. Switch PO NSAID to Diclofenac topical gel 2g TID daily vs PRN for knees 3. Can continue supplement glucosamine 4. Stay active, regular walking, knee compression and RICE as needed 5. Follow-up 6 months - do recommend standing knee x-rays, in future if progressing consider injections. Today patient declined X-rays

## 2016-02-08 NOTE — Patient Instructions (Signed)
Thank you for coming in to clinic today.  1. Keep up the good work - Stop Lisinopril-HCTZ combo pill, DC HCTZ - Start new rx Lisinopril 10mg  once daily - Check BP regularly once a day, different times of day, write down numbers, goal < 140/90, also concern if < 100/60 consistently AND symptomatic lightheaded, dizzy or weak, notify us sooner if problem  2. For Chronic Kidney Diseaes (CKD) - Remain off of ibuprofen, aleve. Stop Diclofenac pills for now, if absolutely need you can try these - Start new topical Voltaren (diclofenac gel) use up to 3 times a day as needed, you can choose to start daily for chronic use OR may use only in flare - continue Tylenol  Please schedule NURSE ONLY visit for BP check in 2-4 weeks, BRING home cuff and readings  You will be due for NON fasting BLOOD WORK  - Please go ahead and schedule a "Lab Only" visit in the morning at the clinic for lab draw in 6 months  For Lab Results, once available within 2-3 days of blood draw, you can can log in to MyChart online to view your results and a brief explanation. Also, we can discuss results at next follow-up visit.   Please schedule a follow-up appointment with Dr. Parks Ranger in 6 months for HTN CKD and Lab results  If you have any other questions or concerns, please feel free to call the clinic or send a message through [place]. You may also schedule an earlier appointment if necessary.  Nobie Putnam, DO [place]

## 2016-02-29 ENCOUNTER — Other Ambulatory Visit: Payer: Self-pay | Admitting: Family Medicine

## 2016-02-29 ENCOUNTER — Ambulatory Visit: Payer: Medicare Other

## 2016-02-29 DIAGNOSIS — Z20828 Contact with and (suspected) exposure to other viral communicable diseases: Secondary | ICD-10-CM

## 2016-02-29 MED ORDER — OSELTAMIVIR PHOSPHATE 75 MG PO CAPS: 75.0000 mg | ORAL_CAPSULE | Freq: Two times a day (BID) | ORAL | 0 refills | Status: DC

## 2016-03-21 ENCOUNTER — Other Ambulatory Visit: Payer: Self-pay | Admitting: Family Medicine

## 2016-04-27 ENCOUNTER — Other Ambulatory Visit: Payer: Medicare Other

## 2016-04-27 ENCOUNTER — Ambulatory Visit: Payer: Medicare Other | Admitting: Hematology and Oncology

## 2016-05-03 ENCOUNTER — Other Ambulatory Visit: Payer: Self-pay | Admitting: Family Medicine

## 2016-05-03 DIAGNOSIS — D225 Melanocytic nevi of trunk: Secondary | ICD-10-CM | POA: Diagnosis not present

## 2016-05-03 DIAGNOSIS — L728 Other follicular cysts of the skin and subcutaneous tissue: Secondary | ICD-10-CM | POA: Diagnosis not present

## 2016-05-03 DIAGNOSIS — D2262 Melanocytic nevi of left upper limb, including shoulder: Secondary | ICD-10-CM | POA: Diagnosis not present

## 2016-05-03 DIAGNOSIS — L608 Other nail disorders: Secondary | ICD-10-CM | POA: Diagnosis not present

## 2016-05-03 MED ORDER — METOPROLOL TARTRATE 25 MG PO TABS: 12.5000 mg | ORAL_TABLET | Freq: Two times a day (BID) | ORAL | 3 refills | Status: DC

## 2016-05-08 ENCOUNTER — Inpatient Hospital Stay (HOSPITAL_BASED_OUTPATIENT_CLINIC_OR_DEPARTMENT_OTHER): Payer: Medicare Other | Admitting: Hematology and Oncology

## 2016-05-08 ENCOUNTER — Encounter: Payer: Self-pay | Admitting: Hematology and Oncology

## 2016-05-08 ENCOUNTER — Inpatient Hospital Stay: Payer: Medicare Other | Attending: Hematology and Oncology

## 2016-05-08 VITALS — BP 148/70 | HR 54 | Temp 96.5°F | Resp 18 | Wt 185.5 lb

## 2016-05-08 DIAGNOSIS — Z803 Family history of malignant neoplasm of breast: Secondary | ICD-10-CM | POA: Diagnosis not present

## 2016-05-08 DIAGNOSIS — Z9223 Personal history of estrogen therapy: Secondary | ICD-10-CM

## 2016-05-08 DIAGNOSIS — Z880 Allergy status to penicillin: Secondary | ICD-10-CM | POA: Diagnosis not present

## 2016-05-08 DIAGNOSIS — Z79899 Other long term (current) drug therapy: Secondary | ICD-10-CM | POA: Insufficient documentation

## 2016-05-08 DIAGNOSIS — M8580 Other specified disorders of bone density and structure, unspecified site: Secondary | ICD-10-CM | POA: Insufficient documentation

## 2016-05-08 DIAGNOSIS — M479 Spondylosis, unspecified: Secondary | ICD-10-CM | POA: Insufficient documentation

## 2016-05-08 DIAGNOSIS — Z7982 Long term (current) use of aspirin: Secondary | ICD-10-CM | POA: Insufficient documentation

## 2016-05-08 DIAGNOSIS — I10 Essential (primary) hypertension: Secondary | ICD-10-CM | POA: Diagnosis not present

## 2016-05-08 DIAGNOSIS — Z853 Personal history of malignant neoplasm of breast: Secondary | ICD-10-CM

## 2016-05-08 DIAGNOSIS — Z170 Estrogen receptor positive status [ER+]: Secondary | ICD-10-CM | POA: Diagnosis not present

## 2016-05-08 DIAGNOSIS — Z923 Personal history of irradiation: Secondary | ICD-10-CM | POA: Diagnosis not present

## 2016-05-08 DIAGNOSIS — C50912 Malignant neoplasm of unspecified site of left female breast: Secondary | ICD-10-CM

## 2016-05-08 DIAGNOSIS — M85862 Other specified disorders of bone density and structure, left lower leg: Secondary | ICD-10-CM

## 2016-05-08 LAB — CBC WITH DIFFERENTIAL/PLATELET
Basophils Absolute: 0 10*3/uL (ref 0–0.1)
Basophils Relative: 1 %
Eosinophils Absolute: 0.1 10*3/uL (ref 0–0.7)
Eosinophils Relative: 1 %
HCT: 37.9 % (ref 35.0–47.0)
Hemoglobin: 13 g/dL (ref 12.0–16.0)
Lymphocytes Relative: 18 %
Lymphs Abs: 1 10*3/uL (ref 1.0–3.6)
MCH: 30.9 pg (ref 26.0–34.0)
MCHC: 34.4 g/dL (ref 32.0–36.0)
MCV: 89.8 fL (ref 80.0–100.0)
Monocytes Absolute: 0.4 10*3/uL (ref 0.2–0.9)
Monocytes Relative: 7 %
Neutro Abs: 4.1 10*3/uL (ref 1.4–6.5)
Neutrophils Relative %: 73 %
Platelets: 176 10*3/uL (ref 150–440)
RBC: 4.22 MIL/uL (ref 3.80–5.20)
RDW: 12.7 % (ref 11.5–14.5)
WBC: 5.7 10*3/uL (ref 3.6–11.0)

## 2016-05-08 LAB — COMPREHENSIVE METABOLIC PANEL
ALT: 17 U/L (ref 14–54)
AST: 22 U/L (ref 15–41)
Albumin: 4 g/dL (ref 3.5–5.0)
Alkaline Phosphatase: 58 U/L (ref 38–126)
Anion gap: 5 (ref 5–15)
BUN: 25 mg/dL — ABNORMAL HIGH (ref 6–20)
CO2: 25 mmol/L (ref 22–32)
Calcium: 9.7 mg/dL (ref 8.9–10.3)
Chloride: 107 mmol/L (ref 101–111)
Creatinine, Ser: 1.09 mg/dL — ABNORMAL HIGH (ref 0.44–1.00)
GFR calc Af Amer: 55 mL/min — ABNORMAL LOW (ref >60)
GFR calc non Af Amer: 47 mL/min — ABNORMAL LOW (ref >60)
Glucose, Bld: 101 mg/dL — ABNORMAL HIGH (ref 65–99)
Potassium: 4.6 mmol/L (ref 3.5–5.1)
Sodium: 137 mmol/L (ref 135–145)
Total Bilirubin: 0.7 mg/dL (ref 0.3–1.2)
Total Protein: 7 g/dL (ref 6.5–8.1)

## 2016-05-08 NOTE — Progress Notes (Signed)
[place] [facility] day:  05/08/2016  Chief Complaint: Angela Bullock is a 81 y.o. female with stage I left breast cancer who is seen for 6 month assessment.  HPI: The patient was last seen in the medical oncology clinic on 10/28/2015.  At that time, she had some low back pain.  Exam was unremarkable.  Lumbar spine films revealed advanced lumbar spondylosis with convex right curvature.  CBC included a hematocrit of 34.5 with a hemoglobin of 12.2.  Creatinine was 1.31.  CA27.29 was 23.2 (normal).  During the interim, she has "been fine". She has been to her primary care doctor for issues with her knee. She has been put on an anti-inflammatory medication.   She denies any breast concerns.   Past Medical History:  Diagnosis Date  . Allergy   . Breast cancer ([place]) 01/30/2012   left, radiation and lumpectomy  . Hypertension     Past Surgical History:  Procedure Laterality Date  . ABDOMINAL HYSTERECTOMY    . BREAST BIOPSY Left 01/30/2011   negative  . BREAST BIOPSY Left 01/30/2012   positive  . BREAST CYST ASPIRATION    . BREAST LUMPECTOMY Left 04/02/2012   positive    Family History  Problem Relation Age of Onset  . Breast cancer Paternal Aunt 85  . Osteoporosis Mother   . Heart attack Father     Social History:  reports that she has never smoked. She has never used smokeless tobacco. She reports that she does not drink alcohol or use drugs.  The patient lives in [place].  The patient is alone today.  Allergies:  Allergies  Allergen Reactions  . Penicillins Rash    Current Medications: Current Outpatient Prescriptions  Medication Sig Dispense Refill  . aspirin EC 81 MG tablet Take 1 tablet (81 mg total) by mouth daily.    Marland Kitchen atorvastatin (LIPITOR) 10 MG tablet TAKE 1 TABLET BY MOUTH AT BEDTIME 90 tablet 3  . Biotin 10 MG CAPS Take 10 mg by mouth daily.    . calcium carbonate (OSCAL) 1500 (600 CA) MG TABS tablet Take by mouth 2 (two) times  daily with a meal.    . cholecalciferol (VITAMIN D) 1000 UNITS tablet Take 1,000 Units by mouth daily.    Marland Kitchen co-enzyme Q-10 50 MG capsule Take 50 mg by mouth daily.    . diclofenac sodium (VOLTAREN) 1 % GEL Apply 2 g topically 3 (three) times daily as needed. 100 g 5  . glucosamine-chondroitin 500-400 MG tablet Take 1 tablet by mouth 3 (three) times daily.    Marland Kitchen ibandronate (BONIVA) 150 MG tablet TAKE 1 TABLET BY MOUTH MONTHLY. 3 tablet 0  . lisinopril (PRINIVIL,ZESTRIL) 10 MG tablet Take 1 tablet (10 mg total) by mouth daily. 90 tablet 3  . metoprolol tartrate (LOPRESSOR) 25 MG tablet Take 0.5 tablets (12.5 mg total) by mouth 2 (two) times daily. 90 tablet 3  . Multiple Vitamins-Minerals (CENTRUM SILVER PO) Take 1 tablet by mouth daily.    . Omega-3 Krill Oil 300 MG CAPS Take 1 capsule by mouth daily.    Marland Kitchen oseltamivir (TAMIFLU) 75 MG capsule Take 1 capsule (75 mg total) by mouth 2 (two) times daily. For 5 days (Patient not taking: Reported on 05/08/2016) 10 capsule 0   No current facility-administered medications for this visit.     Review of Systems:  GENERAL:  Feels "fine".  No fevers or sweats.  Weight stable. PERFORMANCE STATUS (ECOG):  0 HEENT: No visual changes, runny nose, sore throat, mouth sores or tenderness. Lungs: No shortness of breath or cough.  No hemoptysis. Cardiac:  No chest pain, palpitations, orthopnea, or PND. GI:  No nausea, vomiting, diarrhea, constipation, melena or hematochezia. GU:  No urgency, frequency, dysuria, or hematuria. Musculoskeletal:  No back pain.  Knee issues.  No muscle tenderness. Extremities:  No pain or swelling. Skin:  No rashes or skin changes. Neuro:  No headache, numbness or weakness, balance or coordination issues. Endocrine:  No diabetes, thyroid issues, hot flashes or night sweats. Psych:  No mood changes, depression or anxiety. Pain:  No pain. Review of systems:  All other systems reviewed and found to be negative.  Physical  Exam: Blood pressure (!) 148/70, pulse (!) 54, temperature (!) 96.5 F (35.8 C), temperature source Tympanic, resp. rate 18, weight 185 lb 8 oz (84.1 kg). GENERAL:  Well developed, well nourished, woman sitting comfortably in the exam room in no acute distress. MENTAL STATUS:  Alert and oriented to person, place and time. HEAD:  Short gray hair.  Normocephalic, atraumatic, face symmetric, no Cushingoid features. EYES:  Blue eyes.  Pupils equal round and reactive to light and accomodation.  No conjunctivitis or scleral icterus. ENT:  Oropharynx clear without lesion.  Tongue normal. Mucous membranes moist.  RESPIRATORY:  Clear to auscultation without rales, wheezes or rhonchi. CARDIOVASCULAR:  Regular rate and rhythm without murmur, rub or gallop. BREAST:  Right breast without masses, skin changes or nipple discharge.  Left breast with well healed incision at the 3 o'clock position.  Left breast without masses, skin changes or nipple discharge.  Bilateral fibrocystic changes.  Left axillary cyst s/p drain.  Area firm. ABDOMEN:  Soft, non-tender, with active bowel sounds, and no appreciable hepatosplenomegaly.  No masses. SKIN:  No rashes, ulcers or lesions. EXTREMITIES: No edema, no skin discoloration or tenderness.  No palpable cords. LYMPH NODES: No palpable cervical, supraclavicular, axillary or inguinal adenopathy. NEUROLOGICAL: Unremarkable. PSYCH:  Appropriate.   Appointment on 05/08/2016  Component Date Value Ref Range Status  . WBC 05/08/2016 5.7  3.6 - 11.0 K/uL Final  . RBC 05/08/2016 4.22  3.80 - 5.20 MIL/uL Final  . Hemoglobin 05/08/2016 13.0  12.0 - 16.0 g/dL Final  . HCT 05/08/2016 37.9  35.0 - 47.0 % Final  . MCV 05/08/2016 89.8  80.0 - 100.0 fL Final  . MCH 05/08/2016 30.9  26.0 - 34.0 pg Final  . MCHC 05/08/2016 34.4  32.0 - 36.0 g/dL Final  . RDW 05/08/2016 12.7  11.5 - 14.5 % Final  . Platelets 05/08/2016 176  150 - 440 K/uL Final  . Neutrophils Relative % 05/08/2016 73   % Final  . Neutro Abs 05/08/2016 4.1  1.4 - 6.5 K/uL Final  . Lymphocytes Relative 05/08/2016 18  % Final  . Lymphs Abs 05/08/2016 1.0  1.0 - 3.6 K/uL Final  . Monocytes Relative 05/08/2016 7  % Final  . Monocytes Absolute 05/08/2016 0.4  0.2 - 0.9 K/uL Final  . Eosinophils Relative 05/08/2016 1  % Final  . Eosinophils Absolute 05/08/2016 0.1  0 - 0.7 K/uL Final  . Basophils Relative 05/08/2016 1  % Final  . Basophils Absolute 05/08/2016 0.0  0 - 0.1 K/uL Final  . Sodium 05/08/2016 137  135 - 145 mmol/L Final  . Potassium 05/08/2016 4.6  3.5 - 5.1 mmol/L Final  . Chloride 05/08/2016 107  101 - 111 mmol/L Final  . CO2 05/08/2016 25  22 -  32 mmol/L Final  . Glucose, Bld 05/08/2016 101* 65 - 99 mg/dL Final  . BUN 05/08/2016 25* 6 - 20 mg/dL Final  . Creatinine, Ser 05/08/2016 1.09* 0.44 - 1.00 mg/dL Final  . Calcium 05/08/2016 9.7  8.9 - 10.3 mg/dL Final  . Total Protein 05/08/2016 7.0  6.5 - 8.1 g/dL Final  . Albumin 05/08/2016 4.0  3.5 - 5.0 g/dL Final  . AST 05/08/2016 22  15 - 41 U/L Final  . ALT 05/08/2016 17  14 - 54 U/L Final  . Alkaline Phosphatase 05/08/2016 58  38 - 126 U/L Final  . Total Bilirubin 05/08/2016 0.7  0.3 - 1.2 mg/dL Final  . GFR calc non Af Amer 05/08/2016 47* >60 mL/min Final  . GFR calc Af Amer 05/08/2016 55* >60 mL/min Final   Comment: (NOTE) The eGFR has been calculated using the CKD EPI equation. This calculation has not been validated in all clinical situations. eGFR's persistently <60 mL/min signify possible Chronic Kidney Disease.   . Anion gap 05/08/2016 5  5 - 15 Final  . CA 27.29 05/08/2016 26.9  0.0 - 38.6 U/mL Final   Comment: (NOTE) [employer] [facility]/ACS methodology Performed At: [facility] [place], [place] [id number] Lindon Romp MD MW:[id number]     Assessment:  Angela Bullock is a 81 y.o. female with a stage I left breast cancer status post excisional biopsy on 04/02/2012. Pathology revealed a 1.3 cm moderately  differentiated basal cell carcinoma. There was no DCIS or vascular invasion. Tumor extended to the lateral margin.  Pathology was ER +100%, PR +73% and HER-2/neu 1+.  She then underwent left partial mastectomy with sentinel lymph node biopsy on 05/02/2012.  Pathology revealed no evidence of residual malignancy. Margins were negative. Zero of 6 sentinel lymph nodes were positive. Pathologic stage was [id number].  She underwent Mammosite radiation. She began Arimidex on 05/2012. She took Arimidex for approximately 1 year. She discontinued Arimidex in 05/2013 as she did not like the side effect profile.  She decided against any other hormonal therapy.   Mammogram and right ultrasound on 10/17/2015 revealed a 1.8 cm benign cyst within the left breast at the 12 o'clock retroareolar location.    CA27.29 has been followed: 33.7 on 10/21/2014, 26.1 on 04/28/2015, 23.2 on 10/28/2015, and 26.9 on 05/08/2016.  Bone density study on 10/08/2012 revealed osteopenia (T score -1.8 in left femoral neck).  Bone density study on 10/14/2014 revealed a T-score of -1.7 in the right femur neck.  She is on calcium and vitamin D.  Symptomatically, she feels good.  Exam reveals stable post-operative changes.  Plan: 1.  Labs today:  CBC with diff, CMP, CA27.29. 2.  Schedule mammogram on 10/16/2016. 3.  Schedule bone density study on on 10/16/2016. 4.  Continue calcium and vitamin D. 5.  RTC in 6 months for MD assessment and labs (CBC with diff, CMP, CA27.29).   Lequita Asal, MD  05/08/2016, 11:44 AM

## 2016-05-08 NOTE — Progress Notes (Signed)
Patient here today for 6 month follow up regarding breast cancer. 

## 2016-05-09 LAB — CANCER ANTIGEN 27.29: CA 27.29: 26.9 U/mL (ref 0.0–38.6)

## 2016-06-19 ENCOUNTER — Other Ambulatory Visit: Payer: Self-pay

## 2016-06-19 DIAGNOSIS — M8580 Other specified disorders of bone density and structure, unspecified site: Secondary | ICD-10-CM

## 2016-06-19 MED ORDER — IBANDRONATE SODIUM 150 MG PO TABS: ORAL_TABLET | ORAL | 0 refills | Status: DC

## 2016-07-31 ENCOUNTER — Ambulatory Visit (INDEPENDENT_AMBULATORY_CARE_PROVIDER_SITE_OTHER): Payer: Medicare Other

## 2016-07-31 VITALS — BP 132/78 | HR 74 | Temp 98.0°F | Ht 63.0 in | Wt 191.0 lb

## 2016-07-31 DIAGNOSIS — Z0000 Encounter for general adult medical examination without abnormal findings: Secondary | ICD-10-CM

## 2016-07-31 NOTE — Progress Notes (Signed)
Subjective:   Angela Bullock is a 81 y.o. female who presents for Medicare Annual (Subsequent) preventive examination.  Review of Systems:   Cardiac Risk Factors include: hypertension;dyslipidemia;obesity (BMI >30kg/m2)     Objective:     Vitals: BP 132/78 (BP Location: Right Arm, Patient Position: Sitting)   Pulse 74   Temp 98 F (36.7 C)   Ht 5\' 3"  (1.6 m)   Wt 191 lb (86.6 kg)   BMI 33.83 kg/m   Body mass index is 33.83 kg/m.   Tobacco History  Smoking Status  . Never Smoker  Smokeless Tobacco  . Never Used     Counseling given: Not Answered   Past Medical History:  Diagnosis Date  . Allergy   . Breast cancer ([place]) 01/30/2012   left, radiation and lumpectomy  . Hypertension    Past Surgical History:  Procedure Laterality Date  . ABDOMINAL HYSTERECTOMY    . BREAST BIOPSY Left 01/30/2011   negative  . BREAST BIOPSY Left 01/30/2012   positive  . BREAST CYST ASPIRATION    . BREAST LUMPECTOMY Left 04/02/2012   positive   Family History  Problem Relation Age of Onset  . Breast cancer Paternal Aunt 85  . Osteoporosis Mother   . Heart attack Father    History  Sexual Activity  . Sexual activity: Not on file    Outpatient Encounter Prescriptions as of 07/31/2016  Medication Sig  . aspirin EC 81 MG tablet Take 1 tablet (81 mg total) by mouth daily.  Marland Kitchen atorvastatin (LIPITOR) 10 MG tablet TAKE 1 TABLET BY MOUTH AT BEDTIME  . Biotin 10 MG CAPS Take 10 mg by mouth daily.  . calcium carbonate (OSCAL) 1500 (600 CA) MG TABS tablet Take by mouth 2 (two) times daily with a meal.  . cholecalciferol (VITAMIN D) 1000 UNITS tablet Take 1,000 Units by mouth daily.  Marland Kitchen co-enzyme Q-10 50 MG capsule Take 50 mg by mouth daily.  Marland Kitchen glucosamine-chondroitin 500-400 MG tablet Take 1 tablet by mouth 3 (three) times daily.  Marland Kitchen ibandronate (BONIVA) 150 MG tablet TAKE 1 TABLET BY MOUTH MONTHLY.  Marland Kitchen lisinopril (PRINIVIL,ZESTRIL) 10 MG tablet Take 1 tablet (10 mg total) by mouth daily.  .  metoprolol tartrate (LOPRESSOR) 25 MG tablet Take 0.5 tablets (12.5 mg total) by mouth 2 (two) times daily.  . Multiple Vitamins-Minerals (CENTRUM SILVER PO) Take 1 tablet by mouth daily.  . diclofenac sodium (VOLTAREN) 1 % GEL Apply 2 g topically 3 (three) times daily as needed. (Patient not taking: Reported on 07/31/2016)  . Omega-3 Krill Oil 300 MG CAPS Take 1 capsule by mouth daily.  . [DISCONTINUED] oseltamivir (TAMIFLU) 75 MG capsule Take 1 capsule (75 mg total) by mouth 2 (two) times daily. For 5 days (Patient not taking: Reported on 05/08/2016)   No facility-administered encounter medications on file as of 07/31/2016.     Activities of Daily Living In your present state of health, do you have any difficulty performing the following activities: 07/31/2016 11/08/2015  Hearing? N N  Vision? N N  Difficulty concentrating or making decisions? Y N  Walking or climbing stairs? Y Y  Dressing or bathing? N N  Doing errands, shopping? N N  Preparing Food and eating ? N -  Using the Toilet? N -  In the past six months, have you accidently leaked urine? N -  Do you have problems with loss of bowel control? N -  Managing your Medications? N -  Managing your  Finances? N -  Housekeeping or managing your Housekeeping? N -  Some recent data might be hidden    Patient Care Team: Olin Hauser, DO as PCP - General (Family Medicine)    Assessment:     Exercise Activities and Dietary recommendations Current Exercise Habits: The patient does not participate in regular exercise at present, Exercise limited by: orthopedic condition(s)  Goals    . Increase water intake          Recommend drinking at least 4-5 glasses of water a day      Fall Risk Fall Risk  07/31/2016 11/08/2015  Falls in the past year? Yes No  Number falls in past yr: 1 -  Injury with Fall? No -   Depression Screen PHQ 2/9 Scores 07/31/2016 11/08/2015  PHQ - 2 Score 0 0     Cognitive Function     6CIT Screen  07/31/2016  What Year? 0 points  What month? 0 points  What time? 0 points  Count back from 20 0 points  Months in reverse 0 points  Repeat phrase 0 points  Total Score 0    Immunization History  Administered Date(s) Administered  . Influenza, High Dose Seasonal PF 10/22/2014  . Influenza-Unspecified 10/24/2015  . Zoster 01/30/2012   Screening Tests Health Maintenance  Topic Date Due  . INFLUENZA VACCINE  08/29/2016  . TETANUS/TDAP  05/22/2023  . DEXA SCAN  Completed  . PNA vac Low Risk Adult  Completed      Plan:    I have personally reviewed and addressed the Medicare Annual Wellness questionnaire and have noted the following in the patient's chart:  A. Medical and social history B. Use of alcohol, tobacco or illicit drugs  C. Current medications and supplements D. Functional ability and status E.  Nutritional status F.  Physical activity G. Advance directives H. List of other physicians I.  Hospitalizations, surgeries, and ER visits in previous 12 months J.  [place] such as hearing and vision if needed, cognitive and depression L. Referrals and appointments   In addition, I have reviewed and discussed with patient certain preventive protocols, quality metrics, and best practice recommendations. A written personalized care plan for preventive services as well as general preventive health recommendations were provided to patient.   Signed,  Tyler Aas, LPN Nurse Health Advisor   MD Recommendations: none

## 2016-07-31 NOTE — Patient Instructions (Addendum)
Angela Bullock , Thank you for taking time to come for your Medicare Wellness Visit. I appreciate your ongoing commitment to your health goals. Please review the following plan we discussed and let me know if I can assist you in the future.   Screening recommendations/referrals: Colonoscopy: completed 01/30/2008, no longer required Mammogram: completed 10/17/2015, no longer required Bone Density: completed 10/14/2014 Recommended yearly ophthalmology/optometry visit for glaucoma screening and checkup Recommended yearly dental visit for hygiene and checkup  Vaccinations: Influenza vaccine: up to date, due 09/2016 Pneumococcal vaccine: up to date Tdap vaccine: up to date Shingles vaccine: up to date  Advanced directives: Please bring a copy of your health care power of attorney and living will to the office at your convenience.  Conditions/risks identified: Recommend drinking at least 4-5 glasses of water a day  Next appointment: Follow up on 08/07/2016 at 9:40am with Dr.Karamalegos. Follow up in one year for your annual wellness exam.    Preventive Care 79 Years and Older, Female Preventive care refers to lifestyle choices and visits with your health care provider that can promote health and wellness. What does preventive care include?  A yearly physical exam. This is also called an annual well check.  Dental exams once or twice a year.  Routine eye exams. Ask your health care provider how often you should have your eyes checked.  Personal lifestyle choices, including:  Daily care of your teeth and gums.  Regular physical activity.  Eating a healthy diet.  Avoiding tobacco and drug use.  Limiting alcohol use.  Practicing safe sex.  Taking low-dose aspirin every day.  Taking vitamin and mineral supplements as recommended by your health care provider. What happens during an annual well check? The services and screenings done by your health care provider during your annual well  check will depend on your age, overall health, lifestyle risk factors, and family history of disease. Counseling  Your health care provider may ask you questions about your:  Alcohol use.  Tobacco use.  Drug use.  Emotional well-being.  Home and relationship well-being.  Sexual activity.  Eating habits.  History of falls.  Memory and ability to understand (cognition).  Work and work Statistician.  Reproductive health. Screening  You may have the following tests or measurements:  Height, weight, and BMI.  Blood pressure.  Lipid and cholesterol levels. These may be checked every 5 years, or more frequently if you are over 74 years old.  Skin check.  Lung cancer screening. You may have this screening every year starting at age 58 if you have a 30-pack-year history of smoking and currently smoke or have quit within the past 15 years.  Fecal occult blood test (FOBT) of the stool. You may have this test every year starting at age 30.  Flexible sigmoidoscopy or colonoscopy. You may have a sigmoidoscopy every 5 years or a colonoscopy every 10 years starting at age 31.  Hepatitis C blood test.  Hepatitis B blood test.  Sexually transmitted disease (STD) testing.  Diabetes screening. This is done by checking your blood sugar (glucose) after you have not eaten for a while (fasting). You may have this done every 1-3 years.  Bone density scan. This is done to screen for osteoporosis. You may have this done starting at age 50.  Mammogram. This may be done every 1-2 years. Talk to your health care provider about how often you should have regular mammograms. Talk with your health care provider about your test results, treatment options,  and if necessary, the need for more tests. Vaccines  Your health care provider may recommend certain vaccines, such as:  Influenza vaccine. This is recommended every year.  Tetanus, diphtheria, and acellular pertussis (Tdap, Td) vaccine. You  may need a Td booster every 10 years.  Zoster vaccine. You may need this after age 63.  Pneumococcal 13-valent conjugate (PCV13) vaccine. One dose is recommended after age 44.  Pneumococcal polysaccharide (PPSV23) vaccine. One dose is recommended after age 66. Talk to your health care provider about which screenings and vaccines you need and how often you need them. This information is not intended to replace advice given to you by your health care provider. Make sure you discuss any questions you have with your health care provider. Document Released: 02/11/2015 Document Revised: 10/05/2015 Document Reviewed: 11/16/2014 Elsevier Interactive Patient Education  2017 [place] Prevention in the Home Falls can cause injuries. They can happen to people of all ages. There are many things you can do to make your home safe and to help prevent falls. What can I do on the outside of my home?  Regularly fix the edges of walkways and driveways and fix any cracks.  Remove anything that might make you trip as you walk through a door, such as a raised step or threshold.  Trim any bushes or trees on the path to your home.  Use bright outdoor lighting.  Clear any walking paths of anything that might make someone trip, such as rocks or tools.  Regularly check to see if handrails are loose or broken. Make sure that both sides of any steps have handrails.  Any raised decks and porches should have guardrails on the edges.  Have any leaves, snow, or ice cleared regularly.  Use sand or salt on walking paths during winter.  Clean up any spills in your garage right away. This includes oil or grease spills. What can I do in the bathroom?  Use night lights.  Install grab bars by the toilet and in the tub and shower. Do not use towel bars as grab bars.  Use non-skid mats or decals in the tub or shower.  If you need to sit down in the shower, use a plastic, non-slip stool.  Keep the floor  dry. Clean up any water that spills on the floor as soon as it happens.  Remove soap buildup in the tub or shower regularly.  Attach bath mats securely with double-sided non-slip rug tape.  Do not have throw rugs and other things on the floor that can make you trip. What can I do in the bedroom?  Use night lights.  Make sure that you have a light by your bed that is easy to reach.  Do not use any sheets or blankets that are too big for your bed. They should not hang down onto the floor.  Have a firm chair that has side arms. You can use this for support while you get dressed.  Do not have throw rugs and other things on the floor that can make you trip. What can I do in the kitchen?  Clean up any spills right away.  Avoid walking on wet floors.  Keep items that you use a lot in easy-to-reach places.  If you need to reach something above you, use a strong step stool that has a grab bar.  Keep electrical cords out of the way.  Do not use floor polish or wax that makes floors slippery. If  you must use wax, use non-skid floor wax.  Do not have throw rugs and other things on the floor that can make you trip. What can I do with my stairs?  Do not leave any items on the stairs.  Make sure that there are handrails on both sides of the stairs and use them. Fix handrails that are broken or loose. Make sure that handrails are as long as the stairways.  Check any carpeting to make sure that it is firmly attached to the stairs. Fix any carpet that is loose or worn.  Avoid having throw rugs at the top or bottom of the stairs. If you do have throw rugs, attach them to the floor with carpet tape.  Make sure that you have a light switch at the top of the stairs and the bottom of the stairs. If you do not have them, ask someone to add them for you. What else can I do to help prevent falls?  Wear shoes that:  Do not have high heels.  Have rubber bottoms.  Are comfortable and fit you  well.  Are closed at the toe. Do not wear sandals.  If you use a stepladder:  Make sure that it is fully opened. Do not climb a closed stepladder.  Make sure that both sides of the stepladder are locked into place.  Ask someone to hold it for you, if possible.  Clearly mark and make sure that you can see:  Any grab bars or handrails.  First and last steps.  Where the edge of each step is.  Use tools that help you move around (mobility aids) if they are needed. These include:  Canes.  Walkers.  Scooters.  Crutches.  Turn on the lights when you go into a dark area. Replace any light bulbs as soon as they burn out.  Set up your furniture so you have a clear path. Avoid moving your furniture around.  If any of your floors are uneven, fix them.  If there are any pets around you, be aware of where they are.  Review your medicines with your doctor. Some medicines can make you feel dizzy. This can increase your chance of falling. Ask your doctor what other things that you can do to help prevent falls. This information is not intended to replace advice given to you by your health care provider. Make sure you discuss any questions you have with your health care provider. Document Released: 11/11/2008 Document Revised: 06/23/2015 Document Reviewed: 02/19/2014 Elsevier Interactive Patient Education  2017 [employer].

## 2016-08-07 ENCOUNTER — Ambulatory Visit (INDEPENDENT_AMBULATORY_CARE_PROVIDER_SITE_OTHER): Payer: Medicare Other | Admitting: Family Medicine

## 2016-08-07 ENCOUNTER — Encounter: Payer: Self-pay | Admitting: Family Medicine

## 2016-08-07 ENCOUNTER — Other Ambulatory Visit: Payer: Self-pay | Admitting: Family Medicine

## 2016-08-07 VITALS — BP 127/70 | HR 53 | Temp 98.4°F | Resp 16 | Ht 63.0 in | Wt 189.0 lb

## 2016-08-07 DIAGNOSIS — N183 Chronic kidney disease, stage 3 unspecified: Secondary | ICD-10-CM

## 2016-08-07 DIAGNOSIS — I10 Essential (primary) hypertension: Secondary | ICD-10-CM

## 2016-08-07 DIAGNOSIS — M25562 Pain in left knee: Secondary | ICD-10-CM | POA: Diagnosis not present

## 2016-08-07 DIAGNOSIS — M150 Primary generalized (osteo)arthritis: Secondary | ICD-10-CM | POA: Diagnosis not present

## 2016-08-07 DIAGNOSIS — Z79899 Other long term (current) drug therapy: Secondary | ICD-10-CM

## 2016-08-07 DIAGNOSIS — M85862 Other specified disorders of bone density and structure, left lower leg: Secondary | ICD-10-CM

## 2016-08-07 DIAGNOSIS — G8929 Other chronic pain: Secondary | ICD-10-CM | POA: Diagnosis not present

## 2016-08-07 DIAGNOSIS — M25561 Pain in right knee: Secondary | ICD-10-CM

## 2016-08-07 DIAGNOSIS — R799 Abnormal finding of blood chemistry, unspecified: Secondary | ICD-10-CM

## 2016-08-07 DIAGNOSIS — R7309 Other abnormal glucose: Secondary | ICD-10-CM

## 2016-08-07 DIAGNOSIS — Z853 Personal history of malignant neoplasm of breast: Secondary | ICD-10-CM

## 2016-08-07 DIAGNOSIS — M159 Polyosteoarthritis, unspecified: Secondary | ICD-10-CM

## 2016-08-07 MED ORDER — MELOXICAM 15 MG PO TABS: 15.0000 mg | ORAL_TABLET | Freq: Every day | ORAL | 2 refills | Status: DC | PRN

## 2016-08-07 NOTE — Assessment & Plan Note (Signed)
Stable chronic problem since 2014 on DEXA On boniva for >4 years Awaiting upcoming DEXA in 2018 per Oncology, then consider options with Boniva typically would consider use for up to 3 years only, but will defer to Oncology for now.

## 2016-08-07 NOTE — Assessment & Plan Note (Signed)
Well-controlled HTN - Home BP readings reviewed, improved control  Complication with CKD-III   Plan:  1. Continue current BP regimen - Lisinopril 10mg  daily, Metoprolol 12.5mg  BID 2. Encourage improved lifestyle - low sodium diet, regular exercise 3. Continue monitor BP outside office, bring readings to next visit, if persistently >140/90 or new symptoms notify office sooner 4. Follow-up 6 months, review Oncology labs in 10/2016 for Cr trend, order labs for annual phys in 01/2017

## 2016-08-07 NOTE — Assessment & Plan Note (Signed)
Improved, chronic CKD-III  - Last Cr down to 1.17 from prior baseline 1.3, mostly off oral NSAIDs - Secondary to age, HTN, NSAIDs likely  Plan: 1. Agree to rx Meloxicam 15mg  daily PRN for chronic knee OA/DJD pain, also back pain. May use only < 1-2 week per flare, sparingly, avoid frequent use, prefer topical Diclofenac trial first, but was not always resolving flares in past several months, continue Acetaminophen 2. Improve hydration 3. Control BP 4. Check chemistry Cr trend with Oncology labs in 10/2016 5. Follow-up 6 months repeat BMET follow Cr with annual labs

## 2016-08-07 NOTE — Assessment & Plan Note (Signed)
See A&P OA/DJD 

## 2016-08-07 NOTE — Progress Notes (Signed)
Subjective:    Patient ID: Angela Bullock, female    DOB: 10-Dec-1935, 81 y.o.   MRN: [id number]  Angela Bullock is a 81 y.o. female presenting on 08/07/2016 for Hypertension   HPI   CHRONIC HTN: Reports home BP checks initially off HCTZ had some lower readings, then stopped lisinopril 10 and had elevated, then resumed this, stable now Current Meds - Lisinopril 10mg  daily, Metoprolol 12.5mg  BID   Reports good compliance, took meds today. Tolerating well, w/o complaints. Lifestyle: - Exercise: walking occasionally with better weather, avoids heat Denies CP, dyspnea, HA, edema, dizziness / lightheadedness  Osteopenia: - Last DEXA 10/14/2014, showed T score -1.7 consistent with Osteopenia. Has been treated with Boniva reportedly since 2014 following breast cancer surgery. Now 4 years later still taking. Has been on Calcium Vitamin D replacement as well, upcoming DEXA ordered by Oncology along with mammogram.  CKD-III - Reports no new concerns. Last labs done by Oncology 04/2016. Had improved Cr to 1.09 (from 1.31) - Has taken limited oral NSAID only using topical NSAID periodically, otherwise acetaminophen - Denies any problem with voiding, dysuria, hematuria, reduced urinary output  Knee Osteoarthritis / Chronic Knee Pain - Last visit with me 02/08/16, for same problem, treated with switch from Diclofenac oral to Diclofenac topical NSAID gel for knees, continued on glucosamine, Tylenol, regular activity, knee sleeve. - Interval update with no longer using Diclofenac oral at all, and she tried some topical gel PRN with mild relief but not as significant as hoped. Tried husband's meloxicam thought to be 15mg  with good relief x 3 days then stopped, resolved flare. - Today patient reports knees are stable and doing well. She would like to try rx Meloxicam 15mg  daily PRN. She understands not to overuse NSAID and aware of CKD. Still has Diclofenac topical if needed. - No recent imaging of knees -  Denies any new injury, fall, trauma, swelling, other joint problem  Social History  Substance Use Topics  . Smoking status: Never Smoker  . Smokeless tobacco: Never Used  . Alcohol use No    Review of Systems Per HPI unless specifically indicated above     Objective:    BP 127/70   Pulse (!) 53   Temp 98.4 F (36.9 C) (Oral)   Resp 16   Ht 5\' 3"  (1.6 m)   Wt 189 lb (85.7 kg)   BMI 33.48 kg/m   Wt Readings from Last 3 Encounters:  08/07/16 189 lb (85.7 kg)  07/31/16 191 lb (86.6 kg)  05/08/16 185 lb 8 oz (84.1 kg)    Physical Exam  Constitutional: She is oriented to person, place, and time. She appears well-developed and well-nourished. No distress.  Well-appearing, comfortable, cooperative  HENT:  Head: Normocephalic and atraumatic.  Mouth/Throat: Oropharynx is clear and moist.  Eyes: Conjunctivae are normal. Right eye exhibits no discharge. Left eye exhibits no discharge.  Cardiovascular: Normal rate, regular rhythm and intact distal pulses.   Murmur (2/6 systolic murmur, loudest sternal border audible across chest, no specific radiation to carotids) heard. Pulmonary/Chest: Effort normal.  Musculoskeletal: She exhibits no edema.  Bilateral Knees Inspection: Normal appearance and symmetrical. Mild bulkiness bilateral knees. No ecchymosis or effusion. Palpation: Non-tender medial/lateral joint line. Mild bilateral crepitus ROM: Full active ROM bilaterally Strength: 5/5 intact knee flex/ext, ankle dorsi/plantarflex Neurovascular: distally intact sensation light touch and pulses  Neurological: She is alert and oriented to person, place, and time.  Skin: Skin is warm and dry. No rash noted.  She is not diaphoretic. No erythema.  Psychiatric: She has a normal mood and affect. Her behavior is normal.  Well groomed, good eye contact, normal speech and thoughts  Nursing note and vitals reviewed.  Results for orders placed or performed in visit on 05/08/16  CBC with  Differential/Platelet  Result Value Ref Range   WBC 5.7 3.6 - 11.0 K/uL   RBC 4.22 3.80 - 5.20 MIL/uL   Hemoglobin 13.0 12.0 - 16.0 g/dL   HCT 37.9 35.0 - 47.0 %   MCV 89.8 80.0 - 100.0 fL   MCH 30.9 26.0 - 34.0 pg   MCHC 34.4 32.0 - 36.0 g/dL   RDW 12.7 11.5 - 14.5 %   Platelets 176 150 - 440 K/uL   Neutrophils Relative % 73 %   Neutro Abs 4.1 1.4 - 6.5 K/uL   Lymphocytes Relative 18 %   Lymphs Abs 1.0 1.0 - 3.6 K/uL   Monocytes Relative 7 %   Monocytes Absolute 0.4 0.2 - 0.9 K/uL   Eosinophils Relative 1 %   Eosinophils Absolute 0.1 0 - 0.7 K/uL   Basophils Relative 1 %   Basophils Absolute 0.0 0 - 0.1 K/uL  Comprehensive metabolic panel  Result Value Ref Range   Sodium 137 135 - 145 mmol/L   Potassium 4.6 3.5 - 5.1 mmol/L   Chloride 107 101 - 111 mmol/L   CO2 25 22 - 32 mmol/L   Glucose, Bld 101 (H) 65 - 99 mg/dL   BUN 25 (H) 6 - 20 mg/dL   Creatinine, Ser 1.09 (H) 0.44 - 1.00 mg/dL   Calcium 9.7 8.9 - 10.3 mg/dL   Total Protein 7.0 6.5 - 8.1 g/dL   Albumin 4.0 3.5 - 5.0 g/dL   AST 22 15 - 41 U/L   ALT 17 14 - 54 U/L   Alkaline Phosphatase 58 38 - 126 U/L   Total Bilirubin 0.7 0.3 - 1.2 mg/dL   GFR calc non Af Amer 47 (L) >60 mL/min   GFR calc Af Amer 55 (L) >60 mL/min   Anion gap 5 5 - 15  Cancer antigen 27.29  Result Value Ref Range   CA 27.29 26.9 0.0 - 38.6 U/mL      Assessment & Plan:   Problem List Items Addressed This Visit    Osteopenia    Stable chronic problem since 2014 on DEXA On boniva for >4 years Awaiting upcoming DEXA in 2018 per Oncology, then consider options with Boniva typically would consider use for up to 3 years only, but will defer to Oncology for now.      Osteoarthritis, multiple sites    Stable chronic problem with primary concern of knees and back. Currently without flare but prior flares of bilateral knee pain with OA/DJD. No concern for meniscus injury - No knee instability or mechanical locking - No prior history of knee  surgery, arthroscopy - Improved on conservative therapy  Plan: 1. Start anti-inflammatory trial with rx Meloxicam 15mg  daily PRN - reviewed caution with oral NSAIDs, limit use to 1-2 weeks max per flare, prefer < 1 week vs PRN use only, should start with topical diclofenac NSAID to see if can control flare before taking oral NSAID 2. Take Tylenol 500-1000mg  per dose TID PRN breakthrough 3. Bullock therapy (rest, ice, compression, elevation) for swelling, activity modification 4. Defer Knee x-rays for now can check in future 5. Follow-up as planned q 6 months       Relevant Medications  meloxicam (MOBIC) 15 MG tablet   Hypertension    Well-controlled HTN - Home BP readings reviewed, improved control  Complication with CKD-III   Plan:  1. Continue current BP regimen - Lisinopril 10mg  daily, Metoprolol 12.5mg  BID 2. Encourage improved lifestyle - low sodium diet, regular exercise 3. Continue monitor BP outside office, bring readings to next visit, if persistently >140/90 or new symptoms notify office sooner 4. Follow-up 6 months, review Oncology labs in 10/2016 for Cr trend, order labs for annual phys in 01/2017      CKD (chronic kidney disease) stage 3, GFR 30-59 ml/min    Improved, chronic CKD-III  - Last Cr down to 1.17 from prior baseline 1.3, mostly off oral NSAIDs - Secondary to age, HTN, NSAIDs likely  Plan: 1. Agree to rx Meloxicam 15mg  daily PRN for chronic knee OA/DJD pain, also back pain. May use only < 1-2 week per flare, sparingly, avoid frequent use, prefer topical Diclofenac trial first, but was not always resolving flares in past several months, continue Acetaminophen 2. Improve hydration 3. Control BP 4. Check chemistry Cr trend with Oncology labs in 10/2016 5. Follow-up 6 months repeat BMET follow Cr with annual labs      Bilateral chronic knee pain - Primary    See A&P OA/DJD      Relevant Medications   meloxicam (MOBIC) 15 MG tablet      Meds ordered this  encounter  Medications  . meloxicam (MOBIC) 15 MG tablet    Sig: Take 1 tablet (15 mg total) by mouth daily as needed for pain. Prefer to take up to 1-2 weeks at a time then stop.    Dispense:  30 tablet    Refill:  2      Follow up plan: Return in about 6 months (around 02/07/2017) for Medicare Physical CPE.  Waiting on upcoming labs from Oncology in 10/2016, with q 6 month labs. She will return for labs from our office in 01/2017 before her yearly Physical Exam.  Nobie Putnam, DO [facility] 08/07/2016, 5:17 PM

## 2016-08-07 NOTE — Assessment & Plan Note (Signed)
Stable chronic problem with primary concern of knees and back. Currently without flare but prior flares of bilateral knee pain with OA/DJD. No concern for meniscus injury - No knee instability or mechanical locking - No prior history of knee surgery, arthroscopy - Improved on conservative therapy  Plan: 1. Start anti-inflammatory trial with rx Meloxicam 15mg  daily PRN - reviewed caution with oral NSAIDs, limit use to 1-2 weeks max per flare, prefer < 1 week vs PRN use only, should start with topical diclofenac NSAID to see if can control flare before taking oral NSAID 2. Take Tylenol 500-1000mg  per dose TID PRN breakthrough 3. RICE therapy (rest, ice, compression, elevation) for swelling, activity modification 4. Defer Knee x-rays for now can check in future 5. Follow-up as planned q 6 months

## 2016-08-07 NOTE — Patient Instructions (Addendum)
Thank you for coming to the clinic today.  1. BP is improved, continue current plan with Lisinopril and Metoprolol. Remain off HCTZ. - Keep checking BP as you are, if dropping too low, < 100/70 we can consider cutting Lisinopril in HALF from 10 to 5mg  daily, otherwise notify office if persistent elevation >140/90  2.  No labs from my office at this time.  Continue to check blood work through Oncology in October 2018 - every 6 months it seems, will follow Kidney function that way  3. Discuss DEXA scan and results about Boniva with Oncology, to determine if need to continue or not.  Usually Jaclyn Prime is a 3 year medication.  4. Start Meloxicam as needed. Prefer for 5-14 day course if you need it for flare. Less is okay if worse. - Continue topical Diclofenac gel as needed. Should not take both at same time - Avoid all ibuprofen, advil, aleve, naproxen, motrin while on either of these meds  You will be due for FASTING BLOOD WORK (no food or drink after midnight before, only water or coffee without cream/sugar on the morning of)  - Please go ahead and schedule a "Lab Only" visit in the morning at the clinic for lab draw in 6 months  before next Annual Physical - Make sure Lab Only appointment is at least 1-2 weeks before your next appointment, so that results will be available  For Lab Results, once available within 2-3 days of blood draw, you can can log in to MyChart online to view your results and a brief explanation. Also, we can discuss results at next follow-up visit.   Please schedule a Follow-up Appointment to: Return in about 6 months (around 02/07/2017) for Medicare Physical CPE.  If you have any other questions or concerns, please feel free to call the clinic or send a message through [facility]. You may also schedule an earlier appointment if necessary.  Additionally, you may be receiving a survey about your experience at our clinic within a few days to 1 week by e-mail or mail. We  value your feedback.  Nobie Putnam, DO [facility]

## 2016-09-07 ENCOUNTER — Other Ambulatory Visit: Payer: Self-pay | Admitting: Family Medicine

## 2016-09-07 DIAGNOSIS — M8580 Other specified disorders of bone density and structure, unspecified site: Secondary | ICD-10-CM

## 2016-10-17 ENCOUNTER — Ambulatory Visit
Admission: RE | Admit: 2016-10-17 | Discharge: 2016-10-17 | Disposition: A | Discharge status: Home / Self Care | Payer: Medicare Other | Source: Ambulatory Visit | Attending: Hematology and Oncology | Admitting: Hematology and Oncology

## 2016-10-17 ENCOUNTER — Telehealth: Payer: Self-pay | Admitting: *Deleted

## 2016-10-17 DIAGNOSIS — Z170 Estrogen receptor positive status [ER+]: Secondary | ICD-10-CM

## 2016-10-17 DIAGNOSIS — M85851 Other specified disorders of bone density and structure, right thigh: Secondary | ICD-10-CM | POA: Insufficient documentation

## 2016-10-17 DIAGNOSIS — M85862 Other specified disorders of bone density and structure, left lower leg: Secondary | ICD-10-CM

## 2016-10-17 DIAGNOSIS — C50912 Malignant neoplasm of unspecified site of left female breast: Secondary | ICD-10-CM | POA: Diagnosis not present

## 2016-10-17 DIAGNOSIS — Z780 Asymptomatic menopausal state: Secondary | ICD-10-CM | POA: Diagnosis not present

## 2016-10-17 DIAGNOSIS — R922 Inconclusive mammogram: Secondary | ICD-10-CM | POA: Diagnosis not present

## 2016-10-17 HISTORY — DX: Personal history of irradiation: Z92.3

## 2016-10-17 NOTE — Telephone Encounter (Signed)
-----   Message from Lequita Asal, MD sent at 10/17/2016 12:18 PM EDT ----- Regarding: Please call patient  Bone density study stable to slightly improved.  M  ----- Message ----- From: Interface, Rad Results In Sent: 10/17/2016  10:53 AM To: Lequita Asal, MD

## 2016-10-17 NOTE — Telephone Encounter (Signed)
Called patient to inform her that her bone density is stable and slightly improved.  Patient thanked me for calling.

## 2016-11-06 ENCOUNTER — Other Ambulatory Visit: Payer: Self-pay | Admitting: Family Medicine

## 2016-11-06 DIAGNOSIS — G8929 Other chronic pain: Secondary | ICD-10-CM

## 2016-11-06 DIAGNOSIS — M150 Primary generalized (osteo)arthritis: Secondary | ICD-10-CM

## 2016-11-06 DIAGNOSIS — M159 Polyosteoarthritis, unspecified: Secondary | ICD-10-CM

## 2016-11-06 DIAGNOSIS — M25561 Pain in right knee: Principal | ICD-10-CM

## 2016-11-06 DIAGNOSIS — M25562 Pain in left knee: Principal | ICD-10-CM

## 2016-11-08 ENCOUNTER — Inpatient Hospital Stay: Payer: Medicare Other | Admitting: Hematology and Oncology

## 2016-11-08 ENCOUNTER — Inpatient Hospital Stay: Payer: Medicare Other

## 2016-11-14 NOTE — Progress Notes (Signed)
[facility] day:  11/15/2016   Chief Complaint: Angela Bullock is a 81 y.o. female with stage I left breast cancer who is seen for 6 month assessment.  HPI: The patient was last seen in the medical oncology clinic on 05/08/2016.  At that time, patient noted that she was doing "ok". She did report some chronic issues with her knees. She had spoken to her primary care physician and had been prescribed anti-inflammatory medications. WBC was 5700 with an [place] of 4100. Hemoglobin was 13.0, hematocrit 37.9, platelets 176,000. There was a mild elevation in her renal function (BUN 25 with a creatinine of 1.09).  CA27.29 was normal at 26.9. Patient was continue on calcium and vitamin D supplementation daily. She was scheduled for health maintenance studies including routine mammogram and bone density study in September.  Bilateral diagnostic mammogram on 10/17/2016 revealed no evidence of malignancy in either breast.  Bone density study done on 10/17/2016 revealed a T score of -1.6 in the right femoral neck consistent with osteopenia. Normal T score of -0.1 in the left forearm.  She takes Boniva monthly.  During the interim, she has been doing good. Patient denies physical complaints. She states, "I am getting lazy". Patient denies any breast concerns. Patient has not had any B symptoms or interval infections. Patient eating well with no demonstrated weight loss.    Past Medical History:  Diagnosis Date  . Allergy   . Breast cancer ([place]) 01/30/2012   left, radiation and lumpectomy  . Hypertension   . Personal history of radiation therapy     Past Surgical History:  Procedure Laterality Date  . ABDOMINAL HYSTERECTOMY    . BREAST BIOPSY Left 01/30/2011   negative  . BREAST BIOPSY Left 01/30/2012   positive  . BREAST CYST ASPIRATION    . BREAST LUMPECTOMY Left 04/02/2012   positive    Family History  Problem Relation Age of Onset  . Breast cancer Paternal  Aunt 85  . Osteoporosis Mother   . Heart attack Father     Social History:  reports that she has never smoked. She has never used smokeless tobacco. She reports that she does not drink alcohol or use drugs.  The patient lives in [place].  The patient is alone today.  Allergies:  Allergies  Allergen Reactions  . Penicillins Rash    Current Medications: Current Outpatient Prescriptions  Medication Sig Dispense Refill  . aspirin EC 81 MG tablet Take 1 tablet (81 mg total) by mouth daily.    Marland Kitchen atorvastatin (LIPITOR) 10 MG tablet TAKE 1 TABLET BY MOUTH AT BEDTIME 90 tablet 3  . Biotin 10 MG CAPS Take 10 mg by mouth daily.    . calcium carbonate (OSCAL) 1500 (600 CA) MG TABS tablet Take by mouth 2 (two) times daily with a meal.    . cholecalciferol (VITAMIN D) 1000 UNITS tablet Take 1,000 Units by mouth daily.    Marland Kitchen co-enzyme Q-10 50 MG capsule Take 50 mg by mouth daily.    . diclofenac sodium (VOLTAREN) 1 % GEL Apply 2 g topically 3 (three) times daily as needed. 100 g 5  . glucosamine-chondroitin 500-400 MG tablet Take 1 tablet by mouth 3 (three) times daily.    Marland Kitchen ibandronate (BONIVA) 150 MG tablet TAKE 1 TABLET BY MOUTH MONTHLY 3 tablet 0  . lisinopril (PRINIVIL,ZESTRIL) 10 MG tablet Take 1 tablet (10 mg total) by mouth daily. 90 tablet 3  . meloxicam (  MOBIC) 15 MG tablet TAKE 1 TABLET BY MOUTH DAILY AS NEEDED FOR PAIN. PREFER TO TAKE UP TO 1 TO 2 WEEKS AT A TIME THEN STOP 30 tablet 0  . metoprolol tartrate (LOPRESSOR) 25 MG tablet Take 0.5 tablets (12.5 mg total) by mouth 2 (two) times daily. 90 tablet 3  . Multiple Vitamins-Minerals (CENTRUM SILVER PO) Take 1 tablet by mouth daily.    . Omega-3 Krill Oil 300 MG CAPS Take 1 capsule by mouth daily.     No current facility-administered medications for this visit.     Review of Systems:  GENERAL:  Feels "good".  No fevers or sweats.  Weight up 4 pounds.  PERFORMANCE STATUS (ECOG):  0 HEENT: No visual changes, runny nose, sore  throat, mouth sores or tenderness. Lungs: No shortness of breath or cough.  No hemoptysis. Cardiac:  No chest pain, palpitations, orthopnea, or PND. GI:  No nausea, vomiting, diarrhea, constipation, melena or hematochezia. GU:  No urgency, frequency, dysuria, or hematuria. Musculoskeletal:  No back pain.  Knee issues.  No muscle tenderness. Extremities:  No pain or swelling. Skin:  No rashes or skin changes. Neuro:  No headache, numbness or weakness, balance or coordination issues. Endocrine:  No diabetes, thyroid issues, hot flashes or night sweats. Psych:  No mood changes, depression or anxiety. Pain:  No pain. Review of systems:  All other systems reviewed and found to be negative.  Physical Exam: Blood pressure 138/83, pulse 60, temperature 98.1 F (36.7 C), temperature source Tympanic, resp. rate 16, weight 193 lb (87.5 kg). GENERAL:  Well developed, well nourished, woman sitting comfortably in the exam room in no acute distress. MENTAL STATUS:  Alert and oriented to person, place and time. HEAD:  Short thin gray hair.  Normocephalic, atraumatic, face symmetric, no Cushingoid features. EYES:  Blue eyes.  Pupils equal round and reactive to light and accomodation.  No conjunctivitis or scleral icterus. ENT:  Oropharynx clear without lesion.  Tongue normal. Mucous membranes moist.  RESPIRATORY:  Clear to auscultation without rales, wheezes or rhonchi. CARDIOVASCULAR:  Regular rate and rhythm without murmur, rub or gallop. BREAST:  Right breast without masses, skin changes or nipple discharge.  Left breast with well healed incision at the 3 o'clock position. Fibrocystic changes at 9 o'clock.  Left breast without masses, skin changes or nipple discharge.  Left sided fibrocystic changes.  Left axillary cyst overlying scar.  Area firm. ABDOMEN:  Soft, non-tender, with active bowel sounds, and no appreciable hepatosplenomegaly.  No masses. SKIN:  No rashes, ulcers or lesions. EXTREMITIES: No  edema, no skin discoloration or tenderness.  No palpable cords. LYMPH NODES: No palpable cervical, supraclavicular, axillary or inguinal adenopathy. NEUROLOGICAL: Unremarkable. PSYCH:  Appropriate.   Orders Only on 11/15/2016  Component Date Value Ref Range Status  . Sodium 11/15/2016 138  135 - 145 mmol/L Final  . Potassium 11/15/2016 4.3  3.5 - 5.1 mmol/L Final  . Chloride 11/15/2016 104  101 - 111 mmol/L Final  . CO2 11/15/2016 25  22 - 32 mmol/L Final  . Glucose, Bld 11/15/2016 102* 65 - 99 mg/dL Final  . BUN 11/15/2016 29* 6 - 20 mg/dL Final  . Creatinine, Ser 11/15/2016 1.13* 0.44 - 1.00 mg/dL Final  . Calcium 11/15/2016 9.7  8.9 - 10.3 mg/dL Final  . Total Protein 11/15/2016 7.0  6.5 - 8.1 g/dL Final  . Albumin 11/15/2016 4.0  3.5 - 5.0 g/dL Final  . AST 11/15/2016 18  15 - 41 U/L Final  .  ALT 11/15/2016 13* 14 - 54 U/L Final  . Alkaline Phosphatase 11/15/2016 59  38 - 126 U/L Final  . Total Bilirubin 11/15/2016 1.0  0.3 - 1.2 mg/dL Final  . GFR calc non Af Amer 11/15/2016 45* >60 mL/min Final  . GFR calc Af Amer 11/15/2016 53* >60 mL/min Final   Comment: (NOTE) The eGFR has been calculated using the CKD EPI equation. This calculation has not been validated in all clinical situations. eGFR's persistently <60 mL/min signify possible Chronic Kidney Disease.   . Anion gap 11/15/2016 9  5 - 15 Final  . WBC 11/15/2016 5.0  3.6 - 11.0 K/uL Final  . RBC 11/15/2016 4.14  3.80 - 5.20 MIL/uL Final  . Hemoglobin 11/15/2016 13.3  12.0 - 16.0 g/dL Final  . HCT 11/15/2016 38.7  35.0 - 47.0 % Final  . MCV 11/15/2016 93.4  80.0 - 100.0 fL Final  . MCH 11/15/2016 32.1  26.0 - 34.0 pg Final  . MCHC 11/15/2016 34.3  32.0 - 36.0 g/dL Final  . RDW 11/15/2016 12.0  11.5 - 14.5 % Final  . Platelets 11/15/2016 176  150 - 440 K/uL Final  . Neutrophils Relative % 11/15/2016 72  % Final  . Neutro Abs 11/15/2016 3.6  1.4 - 6.5 K/uL Final  . Lymphocytes Relative 11/15/2016 21  % Final  . Lymphs  Abs 11/15/2016 1.0  1.0 - 3.6 K/uL Final  . Monocytes Relative 11/15/2016 6  % Final  . Monocytes Absolute 11/15/2016 0.3  0.2 - 0.9 K/uL Final  . Eosinophils Relative 11/15/2016 1  % Final  . Eosinophils Absolute 11/15/2016 0.1  0 - 0.7 K/uL Final  . Basophils Relative 11/15/2016 0  % Final  . Basophils Absolute 11/15/2016 0.0  0 - 0.1 K/uL Final    Assessment:  Angela Bullock is a 81 y.o. female with a stage I left breast cancer status post excisional biopsy on 04/02/2012. Pathology revealed a 1.3 cm moderately differentiated basal cell carcinoma. There was no DCIS or vascular invasion. Tumor extended to the lateral margin.  Pathology was ER +100%, PR +73% and HER-2/neu 1+.  She then underwent left partial mastectomy with sentinel lymph node biopsy on 05/02/2012.  Pathology revealed no evidence of residual malignancy. Margins were negative. Zero of 6 sentinel lymph nodes were positive. Pathologic stage was [id number].  She underwent Mammosite radiation. She began Arimidex on 05/2012. She took Arimidex for approximately 1 year. She discontinued Arimidex in 05/2013 as she did not like the side effect profile.  She decided against any other hormonal therapy.   Mammogram and right ultrasound on 10/17/2015 revealed a 1.8 cm benign cyst within the left breast at the 12 o'clock retroareolar location. Bilateral diagnostic mammogram on 10/17/2016 revealed no evidence of malignancy in either breast.  CA27.29 has been followed: 33.7 on 10/21/2014, 26.1 on 04/28/2015, 23.2 on 10/28/2015, 26.9 on 05/08/2016, and 28 on 11/15/2016.  Bone density study on 10/08/2012 revealed osteopenia (T score -1.8 in left femoral neck).  Bone density study on 10/14/2014 revealed a T-score of -1.7 in the right femur neck.  Bone density study done on 10/17/2016 revealed a T score of -1.6 in the right femoral neck. She is on calcium and vitamin D.  She is on Boniva.  Symptomatically, she is doing well. She denies new breast  concerns. Exam reveals a recurrent cyst in the left breast/axilla that is intermittently tender.   Plan: 1.  Labs today:  CBC with diff, CMP, CA27.29. 2.  Discuss mammogram done on 10/17/2016 - no evidence of malignancy in either breast. 3.  Discuss bone density study done on 10/17/2016. T score -1.6 in the right femoral neck consistent with osteopenia. Ten-year fracture probability by FRAX of 3% for hip fracture or 20% or greater for major osteoporotic fracture.  Bone density improving on Boniva.  Discuss continuation of calcium '1200mg'$  and  vitamin D 800 IU daily.  Patient to continue oral bisphosphonate (Boniva) therapy once a month.  4.  RTC in 6 months for MD assessment and labs (CBC with diff, CMP, CA27.29).   Honor Loh, NP  11/15/2016 12:10 PM   I saw and evaluated the patient, participating in the key portions of the service and reviewing pertinent diagnostic studies and records.  I reviewed the nurse practitioner's note and agree with the findings and the plan.  The assessment and plan were discussed with the patient.  A few questions were asked by the patient and answered.   Nolon Stalls, MD 11/15/2016,12:10 PM

## 2016-11-15 ENCOUNTER — Other Ambulatory Visit: Payer: Self-pay

## 2016-11-15 ENCOUNTER — Inpatient Hospital Stay (HOSPITAL_BASED_OUTPATIENT_CLINIC_OR_DEPARTMENT_OTHER): Payer: Medicare Other | Admitting: Hematology and Oncology

## 2016-11-15 ENCOUNTER — Encounter: Payer: Self-pay | Admitting: Hematology and Oncology

## 2016-11-15 ENCOUNTER — Inpatient Hospital Stay: Payer: Medicare Other | Attending: Hematology and Oncology

## 2016-11-15 VITALS — BP 138/83 | HR 60 | Temp 98.1°F | Resp 16 | Wt 193.0 lb

## 2016-11-15 DIAGNOSIS — Z853 Personal history of malignant neoplasm of breast: Secondary | ICD-10-CM | POA: Insufficient documentation

## 2016-11-15 DIAGNOSIS — Z79899 Other long term (current) drug therapy: Secondary | ICD-10-CM | POA: Insufficient documentation

## 2016-11-15 DIAGNOSIS — Z880 Allergy status to penicillin: Secondary | ICD-10-CM | POA: Insufficient documentation

## 2016-11-15 DIAGNOSIS — I10 Essential (primary) hypertension: Secondary | ICD-10-CM | POA: Insufficient documentation

## 2016-11-15 DIAGNOSIS — Z9223 Personal history of estrogen therapy: Secondary | ICD-10-CM

## 2016-11-15 DIAGNOSIS — M8589 Other specified disorders of bone density and structure, multiple sites: Secondary | ICD-10-CM | POA: Insufficient documentation

## 2016-11-15 DIAGNOSIS — Z170 Estrogen receptor positive status [ER+]: Principal | ICD-10-CM

## 2016-11-15 DIAGNOSIS — C50912 Malignant neoplasm of unspecified site of left female breast: Secondary | ICD-10-CM

## 2016-11-15 DIAGNOSIS — M85851 Other specified disorders of bone density and structure, right thigh: Secondary | ICD-10-CM

## 2016-11-15 DIAGNOSIS — Z923 Personal history of irradiation: Secondary | ICD-10-CM | POA: Diagnosis not present

## 2016-11-15 DIAGNOSIS — Z803 Family history of malignant neoplasm of breast: Secondary | ICD-10-CM | POA: Diagnosis not present

## 2016-11-15 DIAGNOSIS — M85862 Other specified disorders of bone density and structure, left lower leg: Secondary | ICD-10-CM

## 2016-11-15 DIAGNOSIS — Z7982 Long term (current) use of aspirin: Secondary | ICD-10-CM

## 2016-11-15 LAB — CBC WITH DIFFERENTIAL/PLATELET
Basophils Absolute: 0 10*3/uL (ref 0–0.1)
Basophils Relative: 0 %
Eosinophils Absolute: 0.1 10*3/uL (ref 0–0.7)
Eosinophils Relative: 1 %
HCT: 38.7 % (ref 35.0–47.0)
Hemoglobin: 13.3 g/dL (ref 12.0–16.0)
Lymphocytes Relative: 21 %
Lymphs Abs: 1 10*3/uL (ref 1.0–3.6)
MCH: 32.1 pg (ref 26.0–34.0)
MCHC: 34.3 g/dL (ref 32.0–36.0)
MCV: 93.4 fL (ref 80.0–100.0)
Monocytes Absolute: 0.3 10*3/uL (ref 0.2–0.9)
Monocytes Relative: 6 %
Neutro Abs: 3.6 10*3/uL (ref 1.4–6.5)
Neutrophils Relative %: 72 %
Platelets: 176 10*3/uL (ref 150–440)
RBC: 4.14 MIL/uL (ref 3.80–5.20)
RDW: 12 % (ref 11.5–14.5)
WBC: 5 10*3/uL (ref 3.6–11.0)

## 2016-11-15 LAB — COMPREHENSIVE METABOLIC PANEL
ALT: 13 U/L — ABNORMAL LOW (ref 14–54)
AST: 18 U/L (ref 15–41)
Albumin: 4 g/dL (ref 3.5–5.0)
Alkaline Phosphatase: 59 U/L (ref 38–126)
Anion gap: 9 (ref 5–15)
BUN: 29 mg/dL — ABNORMAL HIGH (ref 6–20)
CO2: 25 mmol/L (ref 22–32)
Calcium: 9.7 mg/dL (ref 8.9–10.3)
Chloride: 104 mmol/L (ref 101–111)
Creatinine, Ser: 1.13 mg/dL — ABNORMAL HIGH (ref 0.44–1.00)
GFR calc Af Amer: 53 mL/min — ABNORMAL LOW (ref >60)
GFR calc non Af Amer: 45 mL/min — ABNORMAL LOW (ref >60)
Glucose, Bld: 102 mg/dL — ABNORMAL HIGH (ref 65–99)
Potassium: 4.3 mmol/L (ref 3.5–5.1)
Sodium: 138 mmol/L (ref 135–145)
Total Bilirubin: 1 mg/dL (ref 0.3–1.2)
Total Protein: 7 g/dL (ref 6.5–8.1)

## 2016-11-15 MED ORDER — ATORVASTATIN CALCIUM 10 MG PO TABS: 10.0000 mg | ORAL_TABLET | Freq: Every day | ORAL | 0 refills | Status: DC

## 2016-11-15 NOTE — Progress Notes (Signed)
Patient is here for follow up with labs. She states that she is feeling well today. She denies having any pain. She has already received her Flu Vaccine.

## 2016-11-16 LAB — CANCER ANTIGEN 27.29: CA 27.29: 28 U/mL (ref 0.0–38.6)

## 2016-12-06 ENCOUNTER — Other Ambulatory Visit: Payer: Self-pay | Admitting: Family Medicine

## 2016-12-06 DIAGNOSIS — M8580 Other specified disorders of bone density and structure, unspecified site: Secondary | ICD-10-CM

## 2016-12-13 ENCOUNTER — Other Ambulatory Visit: Payer: Self-pay | Admitting: Family Medicine

## 2016-12-13 DIAGNOSIS — M25561 Pain in right knee: Principal | ICD-10-CM

## 2016-12-13 DIAGNOSIS — M159 Polyosteoarthritis, unspecified: Secondary | ICD-10-CM

## 2016-12-13 DIAGNOSIS — M25562 Pain in left knee: Principal | ICD-10-CM

## 2016-12-13 DIAGNOSIS — M150 Primary generalized (osteo)arthritis: Secondary | ICD-10-CM

## 2016-12-13 DIAGNOSIS — G8929 Other chronic pain: Secondary | ICD-10-CM

## 2017-01-12 ENCOUNTER — Other Ambulatory Visit: Payer: Self-pay | Admitting: Nurse Practitioner

## 2017-01-12 DIAGNOSIS — M150 Primary generalized (osteo)arthritis: Secondary | ICD-10-CM

## 2017-01-12 DIAGNOSIS — M25561 Pain in right knee: Principal | ICD-10-CM

## 2017-01-12 DIAGNOSIS — M159 Polyosteoarthritis, unspecified: Secondary | ICD-10-CM

## 2017-01-12 DIAGNOSIS — G8929 Other chronic pain: Secondary | ICD-10-CM

## 2017-01-12 DIAGNOSIS — M25562 Pain in left knee: Principal | ICD-10-CM

## 2017-02-05 ENCOUNTER — Other Ambulatory Visit: Payer: Medicare Other

## 2017-02-05 DIAGNOSIS — R799 Abnormal finding of blood chemistry, unspecified: Secondary | ICD-10-CM | POA: Diagnosis not present

## 2017-02-05 DIAGNOSIS — I10 Essential (primary) hypertension: Secondary | ICD-10-CM

## 2017-02-05 DIAGNOSIS — M85862 Other specified disorders of bone density and structure, left lower leg: Secondary | ICD-10-CM | POA: Diagnosis not present

## 2017-02-05 DIAGNOSIS — Z79899 Other long term (current) drug therapy: Secondary | ICD-10-CM

## 2017-02-05 DIAGNOSIS — N183 Chronic kidney disease, stage 3 unspecified: Secondary | ICD-10-CM

## 2017-02-05 DIAGNOSIS — R7309 Other abnormal glucose: Secondary | ICD-10-CM | POA: Diagnosis not present

## 2017-02-06 DIAGNOSIS — H2513 Age-related nuclear cataract, bilateral: Secondary | ICD-10-CM | POA: Diagnosis not present

## 2017-02-06 LAB — LIPID PANEL
CHOLESTEROL: 153 mg/dL (ref <200)
HDL: 59 mg/dL (ref >50)
LDL Cholesterol (Calc): 74 mg/dL (calc)
Non-HDL Cholesterol (Calc): 94 mg/dL (calc) (ref <130)
TRIGLYCERIDES: 116 mg/dL (ref <150)
Total CHOL/HDL Ratio: 2.6 (calc) (ref <5.0)

## 2017-02-06 LAB — BASIC METABOLIC PANEL WITH GFR
BUN/Creatinine Ratio: 22 (calc) (ref 6–22)
BUN: 25 mg/dL (ref 7–25)
CALCIUM: 9.6 mg/dL (ref 8.6–10.4)
CHLORIDE: 106 mmol/L (ref 98–110)
CO2: 27 mmol/L (ref 20–32)
Creat: 1.12 mg/dL — ABNORMAL HIGH (ref 0.60–0.93)
GFR, Est African American: 54 mL/min/{1.73_m2} — ABNORMAL LOW (ref >=60)
GFR, Est Non African American: 47 mL/min/{1.73_m2} — ABNORMAL LOW (ref >=60)
Glucose, Bld: 95 mg/dL (ref 65–99)
POTASSIUM: 4.3 mmol/L (ref 3.5–5.3)
Sodium: 140 mmol/L (ref 135–146)

## 2017-02-06 LAB — HEMOGLOBIN A1C
Hgb A1c MFr Bld: 5.4 % of total Hgb (ref <5.7)
Mean Plasma Glucose: 108 (calc)
eAG (mmol/L): 6 (calc)

## 2017-02-06 LAB — VITAMIN D 25 HYDROXY (VIT D DEFICIENCY, FRACTURES): VIT D 25 HYDROXY: 47 ng/mL (ref 30–100)

## 2017-02-11 ENCOUNTER — Ambulatory Visit (INDEPENDENT_AMBULATORY_CARE_PROVIDER_SITE_OTHER): Payer: Medicare Other | Admitting: Family Medicine

## 2017-02-11 ENCOUNTER — Encounter: Payer: Self-pay | Admitting: Family Medicine

## 2017-02-11 VITALS — BP 134/74 | HR 52 | Temp 97.7°F | Resp 16 | Ht 63.0 in | Wt 195.0 lb

## 2017-02-11 DIAGNOSIS — M159 Polyosteoarthritis, unspecified: Secondary | ICD-10-CM

## 2017-02-11 DIAGNOSIS — M150 Primary generalized (osteo)arthritis: Secondary | ICD-10-CM | POA: Diagnosis not present

## 2017-02-11 DIAGNOSIS — N183 Chronic kidney disease, stage 3 unspecified: Secondary | ICD-10-CM

## 2017-02-11 DIAGNOSIS — M25561 Pain in right knee: Secondary | ICD-10-CM

## 2017-02-11 DIAGNOSIS — M545 Low back pain, unspecified: Secondary | ICD-10-CM

## 2017-02-11 DIAGNOSIS — M85851 Other specified disorders of bone density and structure, right thigh: Secondary | ICD-10-CM

## 2017-02-11 DIAGNOSIS — E785 Hyperlipidemia, unspecified: Secondary | ICD-10-CM | POA: Insufficient documentation

## 2017-02-11 DIAGNOSIS — G8929 Other chronic pain: Secondary | ICD-10-CM | POA: Diagnosis not present

## 2017-02-11 DIAGNOSIS — M25562 Pain in left knee: Secondary | ICD-10-CM | POA: Diagnosis not present

## 2017-02-11 DIAGNOSIS — I10 Essential (primary) hypertension: Secondary | ICD-10-CM

## 2017-02-11 DIAGNOSIS — Z853 Personal history of malignant neoplasm of breast: Secondary | ICD-10-CM

## 2017-02-11 MED ORDER — TRAMADOL HCL 50 MG PO TABS: 50.0000 mg | ORAL_TABLET | Freq: Three times a day (TID) | ORAL | 0 refills | Status: DC | PRN

## 2017-02-11 MED ORDER — ATORVASTATIN CALCIUM 10 MG PO TABS: 10.0000 mg | ORAL_TABLET | Freq: Every day | ORAL | 3 refills | Status: DC

## 2017-02-11 MED ORDER — LISINOPRIL 10 MG PO TABS: 10.0000 mg | ORAL_TABLET | Freq: Every day | ORAL | 3 refills | Status: DC

## 2017-02-11 NOTE — Assessment & Plan Note (Signed)
Stable, chronic CKD-III  - Cr stable 1.12 - Secondary to age, HTN, NSAIDs likely  Plan: 1. Continue intermittent Meloxicam 1-2 weeks on / off, avoid daily use 2. Improve hydration 3. Control BP 4. Check chemistry Cr trend with Oncology labs 5. Follow-up 7 months

## 2017-02-11 NOTE — Progress Notes (Signed)
Subjective:    Patient ID: Angela Bullock, female    DOB: 08-27-1935, 82 y.o.   MRN: [id number]  Angela Bullock is a 82 y.o. female presenting on 02/11/2017 for Annual Exam; Hypertension; and Osteoarthritis  HPI   Here for Annual Physical and Lab Review. She has reviewed lab results on MyChart in advance, no questions.  FOLLOW-UP Chronic Pain Multiple Joints / Osteoarthritis Multiple joints (knees, back) - Last visit 08/07/16, for same problem, treated with Meloxicam PRN instead of topical diclofenac gel, see prior note for background information - Today she reports overall doing well. No active joint pain complaints today. She believes that Meloxicam is working well for her arthritis, and is taking it in intervals only, Meloxicam 15mg  1-2 weeks then stop for 1-2 weeks and stay off med and then will resume again. No longer taking topical diclofenac. Still taking Tylenol but admits very limited relief from this. - She is also asking about temporary pain medicine PRN Tramadol for bad flare ups, allows her to function better and get more activities done, her husband has some, she tried once 50mg  for the one day and did very well, asking about order on this, she has never been on chronic opioids and understands risks. - Now has goal to lose weight and help reduce strain on knees/back, feeling better so will try to become more active now - Also asking about future referral to Orthopedics, she has never been. Asking about knee injections, never had cortisone, considering Synvisc or "[place] Injection", wanted to learn her options, she does not want any surgery or other procedure, also regarding her back, interested in what options are available. - No recent X-rays  History of Breast Cancer, Stage I Left Initial dx excisional bx 03/2012, basal cell CA, no DCIS. She had Left partial mastectomy and sentinel node biopsy 04/2012, no evidence of residual malignancy on pathology, with negative margins and other  nodes, staged [id number]. - She had targeted radiation therapy post-op, and took Arimidex for 1 year, then decided against hormonal therapy due to side effects. - She continues mammogram / imaging surveillance with [facility] Dr Mike Gip, without recurrence  Osteopenia - On Boniva since 2014. She has prior DEXA scan 2016 showed T -1.7, now most recent DEXA 09/2016 with T -1.6, improved, osteopenia, was advised to continue Boniva  CHRONIC HTN: Reports home BP checks stable, remain well off HCTZ.now 1 year. No other med adjust, occasional 130-140 range Current Meds - Lisinopril 10mg  daily, Metoprolol 12.5mg  BID   Reports good compliance, took meds today. Tolerating well, w/o complaints.  CKD-III - Reports no new concerns. Last lab 01/2017, stable. - Still taking Meloxicam, see above intermittent intervals  ASCVD Risk / History of Hyperlipidemia - Reports no concerns. Last lipid panel 01/2017, well controlled. Previous doctor recommended statin due to her ASCVD risk, not necessarily due to significantly elevated cholesterol, recent normal lipid results - Currently taking Atorvastatin 10mg  daily, tolerating well without side effects or myalgias - Taking ASA 81mg  daily  Health Maintenance: UTD vaccinations, Flu Shot 09/2016, PNA vaccine series, and TDap UTD DEXA, see above UTD Mammogram surveillance per Oncology  Depression screen [facility] 2/9 02/11/2017 07/31/2016 11/08/2015  Decreased Interest 0 0 0  Down, Depressed, Hopeless 0 0 0  PHQ - 2 Score 0 0 0    Past Medical History:  Diagnosis Date  . Allergy   . Breast cancer ([place]) 01/30/2012   left, radiation and lumpectomy  . Hypertension   .  Personal history of radiation therapy    Past Surgical History:  Procedure Laterality Date  . ABDOMINAL HYSTERECTOMY    . BREAST BIOPSY Left 01/30/2011   negative  . BREAST BIOPSY Left 01/30/2012   positive  . BREAST CYST ASPIRATION    . BREAST LUMPECTOMY Left 04/02/2012   positive   Social History     Socioeconomic History  . Marital status: Married    Spouse name: Not on file  . Number of children: Not on file  . Years of education: Not on file  . Highest education level: Not on file  Social Needs  . Financial resource strain: Not on file  . Food insecurity - worry: Not on file  . Food insecurity - inability: Not on file  . Transportation needs - medical: Not on file  . Transportation needs - non-medical: Not on file  Occupational History  . Not on file  Tobacco Use  . Smoking status: Never Smoker  . Smokeless tobacco: Never Used  Substance and Sexual Activity  . Alcohol use: No    Alcohol/week: 0.0 oz  . Drug use: No  . Sexual activity: Not on file  Other Topics Concern  . Not on file  Social History Narrative  . Not on file   Family History  Problem Relation Age of Onset  . Breast cancer Paternal Aunt 85  . Osteoporosis Mother   . Heart attack Father    Current Outpatient Medications on File Prior to Visit  Medication Sig  . aspirin EC 81 MG tablet Take 1 tablet (81 mg total) by mouth daily.  . Biotin 10 MG CAPS Take 10 mg by mouth daily.  . calcium carbonate (OSCAL) 1500 (600 CA) MG TABS tablet Take by mouth 2 (two) times daily with a meal.  . cholecalciferol (VITAMIN D) 1000 UNITS tablet Take 1,000 Units by mouth daily.  Marland Kitchen co-enzyme Q-10 50 MG capsule Take 50 mg by mouth daily.  Marland Kitchen glucosamine-chondroitin 500-400 MG tablet Take 1 tablet by mouth 3 (three) times daily.  Marland Kitchen ibandronate (BONIVA) 150 MG tablet TAKE 1 TABLET BY MOUTH MONTHLY  . meloxicam (MOBIC) 15 MG tablet TAKE 1 TABLET BY MOUTH DAILY AS NEEDED FOR PAIN. PREFER TO TAKE UP TO 1 TO 2 WEEKS AT A TIME THEN STOP  . metoprolol tartrate (LOPRESSOR) 25 MG tablet Take 0.5 tablets (12.5 mg total) by mouth 2 (two) times daily.  . Multiple Vitamins-Minerals (CENTRUM SILVER PO) Take 1 tablet by mouth daily.  . Omega-3 Krill Oil 300 MG CAPS Take 1 capsule by mouth daily.   No current facility-administered  medications on file prior to visit.     Review of Systems  Constitutional: Negative for activity change, appetite change, chills, diaphoresis, fatigue and fever.  HENT: Negative for congestion, hearing loss and sinus pressure.   Eyes: Negative for visual disturbance.  Respiratory: Negative for apnea, cough, choking, chest tightness, shortness of breath and wheezing.   Cardiovascular: Negative for chest pain, palpitations and leg swelling.  Gastrointestinal: Negative for abdominal pain, anal bleeding, blood in stool, constipation, diarrhea, nausea and vomiting.  Endocrine: Negative for cold intolerance and polyuria.  Genitourinary: Negative for difficulty urinating, dysuria, frequency and hematuria.  Musculoskeletal: Positive for arthralgias (none today, improved) and back pain (none active today, improved). Negative for neck pain.  Skin: Negative for rash.  Allergic/Immunologic: Negative for environmental allergies.  Neurological: Negative for dizziness, weakness, light-headedness, numbness and headaches.  Hematological: Negative for adenopathy.  Psychiatric/Behavioral: Negative for behavioral problems,  dysphoric mood and sleep disturbance. The patient is not nervous/anxious.    Per HPI unless specifically indicated above     Objective:    BP 134/74 (BP Location: Left Arm, Cuff Size: Normal)   Pulse (!) 52   Temp 97.7 F (36.5 C) (Oral)   Resp 16   Ht 5\' 3"  (1.6 m)   Wt 195 lb (88.5 kg)   BMI 34.54 kg/m   Wt Readings from Last 3 Encounters:  02/11/17 195 lb (88.5 kg)  11/15/16 193 lb (87.5 kg)  08/07/16 189 lb (85.7 kg)    Physical Exam  Constitutional: She is oriented to person, place, and time. She appears well-developed and well-nourished. No distress.  Well-appearing, comfortable, cooperative, pleasant 82 year old female  HENT:  Head: Normocephalic and atraumatic.  Mouth/Throat: Oropharynx is clear and moist.  Frontal / maxillary sinuses non-tender. Nares patent  without purulence or edema. Bilateral TMs clear without erythema, effusion or bulging. Oropharynx clear without erythema, exudates, edema or asymmetry.  Eyes: Conjunctivae and EOM are normal. Pupils are equal, round, and reactive to light. Right eye exhibits no discharge. Left eye exhibits no discharge.  Neck: Normal range of motion. Neck supple. No thyromegaly present.  Cardiovascular: Normal rate, regular rhythm and intact distal pulses. Exam reveals no gallop.  Murmur (Stable unchanged, 2/6 systolic murmur, loudest sternal border audible across chest) heard. Pulmonary/Chest: Effort normal and breath sounds normal. No respiratory distress. She has no wheezes. She has no rales.  Abdominal: Soft. Bowel sounds are normal. She exhibits no distension and no mass. There is no tenderness.  Musculoskeletal: Normal range of motion. She exhibits no edema (trace to no edema) or tenderness.  Upper / Lower Extremities: - Normal muscle tone, strength bilateral upper extremities 5/5, lower extremities 5/5  Bilateral Knees - stable from last visit Inspection: Normal appearance and symmetrical. Mild bulkiness bilateral knees. No ecchymosis or effusion. Palpation: Non-tender medial/lateral joint line. Mild bilateral crepitus ROM: Full active ROM bilaterally Strength: 5/5 intact knee flex/ext, ankle dorsi/plantarflex Neurovascular: distally intact sensation light touch and pulses   Low Back Inspection: Normal appearance, normal body habitus, no spinal deformity, symmetrical. Palpation: No tenderness over spinous processes. Bilateral lumbar paraspinal muscles non-tender and without hypertonicity/spasm. ROM: Full active ROM forward flex / back extension, rotation L/R without discomfort  Lymphadenopathy:    She has no cervical adenopathy.  Neurological: She is alert and oriented to person, place, and time.  Distal sensation intact to light touch all extremities  Skin: Skin is warm and dry. No rash noted. She  is not diaphoretic. No erythema.  Psychiatric: She has a normal mood and affect. Her behavior is normal.  Well groomed, good eye contact, normal speech and thoughts  Nursing note and vitals reviewed.  Results for orders placed or performed in visit on 02/05/17  VITAMIN D 25 Hydroxy (Vit-D Deficiency, Fractures)  Result Value Ref Range   Vit D, 25-Hydroxy 47 30 - 100 ng/mL  BASIC METABOLIC PANEL WITH GFR  Result Value Ref Range   Glucose, Bld 95 65 - 99 mg/dL   BUN 25 7 - 25 mg/dL   Creat 1.12 (H) 0.60 - 0.93 mg/dL   GFR, Est Non African American 47 (L) > OR = 60 mL/min/1.65m2   GFR, Est African American 54 (L) > OR = 60 mL/min/1.57m2   BUN/Creatinine Ratio 22 6 - 22 (calc)   Sodium 140 135 - 146 mmol/L   Potassium 4.3 3.5 - 5.3 mmol/L   Chloride 106 98 -  110 mmol/L   CO2 27 20 - 32 mmol/L   Calcium 9.6 8.6 - 10.4 mg/dL  Lipid panel  Result Value Ref Range   Cholesterol 153 <200 mg/dL   HDL 59 >50 mg/dL   Triglycerides 116 <150 mg/dL   LDL Cholesterol (Calc) 74 mg/dL (calc)   Total CHOL/HDL Ratio 2.6 <5.0 (calc)   Non-HDL Cholesterol (Calc) 94 <130 mg/dL (calc)  Hemoglobin A1c  Result Value Ref Range   Hgb A1c MFr Bld 5.4 <5.7 % of total Hgb   Mean Plasma Glucose 108 (calc)   eAG (mmol/L) 6.0 (calc)      Assessment & Plan:   Problem List Items Addressed This Visit    Bilateral chronic knee pain   Relevant Medications   traMADol (ULTRAM) 50 MG tablet   Chronic low back pain without sciatica    See A&P for osteoarthritis      Relevant Medications   traMADol (ULTRAM) 50 MG tablet   CKD (chronic kidney disease) stage 3, GFR 30-59 ml/min (HCC)    Stable, chronic CKD-III  - Cr stable 1.12 - Secondary to age, HTN, NSAIDs likely  Plan: 1. Continue intermittent Meloxicam 1-2 weeks on / off, avoid daily use 2. Improve hydration 3. Control BP 4. Check chemistry Cr trend with Oncology labs 5. Follow-up 7 months       History of breast cancer    Stable, without  recurrence See HPI / overview for background on breast cancer Left sided, s/p partial mastectomy after biopsy excision, radiation, 1 yr hormonal therapy Arimidex Continues surveillance per Oncology      Hyperlipidemia    Controlled cholesterol on statin and lifestyle Last lipid panel 01/2017 Calculated ASCVD 10 yr risk score >20% primarily d/t age  Plan: 1. Continue current meds - Atorvastatin 10mg  daily 2. Continue ASA 81mg  for primary ASCVD risk reduction 3. Encourage improved lifestyle - low carb/cholesterol, reduce portion size, continue improving regular exercise 4. Follow-up yearly lipids      Relevant Medications   lisinopril (PRINIVIL,ZESTRIL) 10 MG tablet   atorvastatin (LIPITOR) 10 MG tablet   Hypertension    Controlled HTN - Home BP readings reviewed  Complication with CKD-III   Plan:  1. Continue current BP regimen Lisinopril 10mg  daily (refilled), Metoprolol 12.5mg  BID (half of 25) 2. Encourage improved lifestyle - low sodium diet, regular exercise 3. Continue monitor BP outside office, bring readings to next visit, if persistently >140/90 or new symptoms notify office sooner 4. Follow-up 7 months      Relevant Medications   lisinopril (PRINIVIL,ZESTRIL) 10 MG tablet   atorvastatin (LIPITOR) 10 MG tablet   Osteoarthritis, multiple sites    Stable chronic problem with primary concern of knees and back. Currently without flare but prior flares of bilateral knee pain with OA/DJD - No prior history of knee surgery, arthroscopy. Never established with Ortho - No recent imaging - Improved on conservative therapy  Plan: 1. Counseling again on course and progression of OA/DJD - defer Ortho at this time still, handout given info of where to go if interested, may request referral from office if want 2nd opinion or consider advanced injection therapy, such as Synvisc 2. Continue Meloxicam 15mg  as planned - 1-2 weeks at a time then stop 1-2 weeks, use PRN intermittent,  avoid chronic daily use due to CKD 3. Discontinue Diclofenac topical since no longer helping 4. Discussion on meds such as Tramadol for advanced pain control PRN only flare - since tried before and did very  well, given her course, agree to provide a temporary rx approx 5 day supply for PRN use if worsening, no refills will re-eval at next visit, if helpful we can sign [place] Controlled Contract and UDS - reviewed precautions 5. May continue or stop Tylenol 500-1000mg  per dose TID PRN breakthrough 6. Bullock therapy (rest, ice, compression, elevation) for swelling, activity modification 7. Defer Knee x-rays for now can check in future - again reviewed this, I do recommend imaging knees/lumbar - otherwise ortho would image if she was inclined for referral 8. Follow-up as planned q 6-7 months      Relevant Medications   traMADol (ULTRAM) 50 MG tablet   Osteopenia    Stable chronic problem since 2014 on DEXA On boniva for >4-5 years now Last visit Oncology 10/2016, had DEXA 09/2016, T-score -1.6 now slight improve, still low end of osteopenia, recommended continue [place] I advised patient to reconsider Boniva and ask Oncology again if indicated for drug holiday then may resume after repeat DEXA, otherwise will defer decision if indicated to Oncology for more indefinite treatment       Other Visit Diagnoses    Annual physical exam    -  Primary      Meds ordered this encounter  Medications  . traMADol (ULTRAM) 50 MG tablet    Sig: Take 1-2 tablets (50-100 mg total) by mouth every 8 (eight) hours as needed for moderate pain.    Dispense:  30 tablet    Refill:  0  . lisinopril (PRINIVIL,ZESTRIL) 10 MG tablet    Sig: Take 1 tablet (10 mg total) by mouth daily.    Dispense:  90 tablet    Refill:  3  . atorvastatin (LIPITOR) 10 MG tablet    Sig: Take 1 tablet (10 mg total) by mouth at bedtime.    Dispense:  90 tablet    Refill:  3    Follow up plan: Return in about 7 months (around  09/11/2017) for HTN, Osteoarthritis knee/back (med refill).  Nobie Putnam, [facility] 02/11/2017, 12:33 PM

## 2017-02-11 NOTE — Assessment & Plan Note (Signed)
Stable chronic problem with primary concern of knees and back. Currently without flare but prior flares of bilateral knee pain with OA/DJD - No prior history of knee surgery, arthroscopy. Never established with Ortho - No recent imaging - Improved on conservative therapy  Plan: 1. Counseling again on course and progression of OA/DJD - defer Ortho at this time still, handout given info of where to go if interested, may request referral from office if want 2nd opinion or consider advanced injection therapy, such as Synvisc 2. Continue Meloxicam 15mg  as planned - 1-2 weeks at a time then stop 1-2 weeks, use PRN intermittent, avoid chronic daily use due to CKD 3. Discontinue Diclofenac topical since no longer helping 4. Discussion on meds such as Tramadol for advanced pain control PRN only flare - since tried before and did very well, given her course, agree to provide a temporary rx approx 5 day supply for PRN use if worsening, no refills will re-eval at next visit, if helpful we can sign [place] Controlled Contract and UDS - reviewed precautions 5. May continue or stop Tylenol 500-1000mg  per dose TID PRN breakthrough 6. RICE therapy (rest, ice, compression, elevation) for swelling, activity modification 7. Defer Knee x-rays for now can check in future - again reviewed this, I do recommend imaging knees/lumbar - otherwise ortho would image if she was inclined for referral 8. Follow-up as planned q 6-7 months

## 2017-02-11 NOTE — Assessment & Plan Note (Signed)
Stable chronic problem since 2014 on DEXA On boniva for >4-5 years now Last visit Oncology 10/2016, had DEXA 09/2016, T-score -1.6 now slight improve, still low end of osteopenia, recommended continue [place] I advised patient to reconsider Boniva and ask Oncology again if indicated for drug holiday then may resume after repeat DEXA, otherwise will defer decision if indicated to Oncology for more indefinite treatment

## 2017-02-11 NOTE — Assessment & Plan Note (Signed)
Controlled cholesterol on statin and lifestyle Last lipid panel 01/2017 Calculated ASCVD 10 yr risk score >20% primarily d/t age  Plan: 1. Continue current meds - Atorvastatin 10mg  daily 2. Continue ASA 81mg  for primary ASCVD risk reduction 3. Encourage improved lifestyle - low carb/cholesterol, reduce portion size, continue improving regular exercise 4. Follow-up yearly lipids

## 2017-02-11 NOTE — Assessment & Plan Note (Signed)
Controlled HTN - Home BP readings reviewed  Complication with CKD-III   Plan:  1. Continue current BP regimen Lisinopril 10mg  daily (refilled), Metoprolol 12.5mg  BID (half of 25) 2. Encourage improved lifestyle - low sodium diet, regular exercise 3. Continue monitor BP outside office, bring readings to next visit, if persistently >140/90 or new symptoms notify office sooner 4. Follow-up 7 months

## 2017-02-11 NOTE — Patient Instructions (Addendum)
Thank you for coming to the office today.  1.  Continue Meloxicam as you are taking it, intermittent 1-2 weeks at a time.  New Rx Tramadol 50mg  tabs - take 1-2 as needed up to 3 times daily MAX dose - prefer to use sparingly and only PRN - let me know how this rx.   May stop Tylenol if not helping or continue as needed  If and when ready for 2nd opinion from Ortho - call office and request referral with your preference. Can consider those Hyraluronic Acid Injections for knee as you mentioned, may need cortisone inj first though.  For your back / spine, usually Ortho can help, but sometimes more Pain Management has options for injections or other therapy - sometimes ortho can help you get established with this.  [facility] ([facility] Address: [address], [place], [place] [address] Hours:  9AM-5PM Phone: [phone]  [facility] [place],   [address] Phone: [phone]  2. Ask Dr Mike Gip about coming off of [place] for "drug holiday", since you have started it in 2014 and have been on it for 5 years now.  Please schedule a Follow-up Appointment to: Return in about 7 months (around 09/11/2017) for HTN, Osteoarthritis knee/back (med refill).   If you have any other questions or concerns, please feel free to call the office or send a message through [place]. You may also schedule an earlier appointment if necessary.  Additionally, you may be receiving a survey about your experience at our office within a few days to 1 week by e-mail or mail. We value your feedback.  Nobie Putnam, DO [place]

## 2017-02-11 NOTE — Assessment & Plan Note (Signed)
Stable, without recurrence See HPI / overview for background on breast cancer Left sided, s/p partial mastectomy after biopsy excision, radiation, 1 yr hormonal therapy Arimidex Continues surveillance per Oncology 

## 2017-02-11 NOTE — Assessment & Plan Note (Signed)
See A&P for osteoarthritis.

## 2017-02-12 NOTE — Addendum Note (Signed)
Addended by: Olin Hauser on: 02/12/2017 05:23 PM   Modules accepted: Level of Service

## 2017-04-25 ENCOUNTER — Other Ambulatory Visit: Payer: Self-pay | Admitting: Family Medicine

## 2017-04-28 ENCOUNTER — Other Ambulatory Visit: Payer: Self-pay | Admitting: Family Medicine

## 2017-04-28 DIAGNOSIS — M25562 Pain in left knee: Principal | ICD-10-CM

## 2017-04-28 DIAGNOSIS — G8929 Other chronic pain: Secondary | ICD-10-CM

## 2017-04-28 DIAGNOSIS — M25561 Pain in right knee: Principal | ICD-10-CM

## 2017-04-28 DIAGNOSIS — M150 Primary generalized (osteo)arthritis: Secondary | ICD-10-CM

## 2017-04-28 DIAGNOSIS — M159 Polyosteoarthritis, unspecified: Secondary | ICD-10-CM

## 2017-05-09 ENCOUNTER — Ambulatory Visit
Admission: RE | Admit: 2017-05-09 | Discharge: 2017-05-09 | Disposition: A | Discharge status: Home / Self Care | Payer: Medicare Other | Source: Ambulatory Visit | Attending: Family Medicine | Admitting: Family Medicine

## 2017-05-09 ENCOUNTER — Ambulatory Visit (INDEPENDENT_AMBULATORY_CARE_PROVIDER_SITE_OTHER): Payer: Medicare Other | Admitting: Family Medicine

## 2017-05-09 ENCOUNTER — Other Ambulatory Visit: Payer: Self-pay

## 2017-05-09 ENCOUNTER — Encounter: Payer: Self-pay | Admitting: Family Medicine

## 2017-05-09 VITALS — BP 130/68 | HR 83 | Temp 98.1°F | Resp 16 | Ht 63.0 in | Wt 187.0 lb

## 2017-05-09 DIAGNOSIS — R198 Other specified symptoms and signs involving the digestive system and abdomen: Secondary | ICD-10-CM

## 2017-05-09 DIAGNOSIS — K8020 Calculus of gallbladder without cholecystitis without obstruction: Secondary | ICD-10-CM | POA: Diagnosis not present

## 2017-05-09 DIAGNOSIS — F4320 Adjustment disorder, unspecified: Secondary | ICD-10-CM | POA: Diagnosis not present

## 2017-05-09 DIAGNOSIS — K5901 Slow transit constipation: Secondary | ICD-10-CM

## 2017-05-09 DIAGNOSIS — M5135 Other intervertebral disc degeneration, thoracolumbar region: Secondary | ICD-10-CM | POA: Insufficient documentation

## 2017-05-09 DIAGNOSIS — R140 Abdominal distension (gaseous): Secondary | ICD-10-CM

## 2017-05-09 DIAGNOSIS — Z634 Disappearance and death of family member: Secondary | ICD-10-CM

## 2017-05-09 DIAGNOSIS — M4185 Other forms of scoliosis, thoracolumbar region: Secondary | ICD-10-CM | POA: Insufficient documentation

## 2017-05-09 MED ORDER — POLYETHYLENE GLYCOL 3350 17 GM/SCOOP PO POWD: 17.0000 g | Freq: Every day | ORAL | 0 refills | Status: DC | PRN

## 2017-05-09 NOTE — Patient Instructions (Addendum)
Thank you for coming to the office today.  We will refill Tramadol as needed - in future, just call or send message, can review at next apt in August  I do not think this is a major cause for concern. Most likely related to recent stress and diet changes.  For Constipation (less frequent bowel movement that can be hard dry or involve straining).  Recommend trying OTC Miralax 17g = 1 capful in large glass water once daily for now, try several days to see if working, goal is soft stool or BM 1-2 times daily, if too loose then reduce dose or try every other day. If not effective may need to increase it to 2 doses at once in AM or may do 1 in morning and 1 in afternoon/evening  - This medicine is very safe and can be used often without any problem and will not make you dehydrated. It is good for use on AS NEEDED BASIS or even MAINTENANCE therapy for longer term for several days to weeks at a time to help regulate bowel movements  Other more natural remedies or preventative treatment: - Increase hydration with water - Increase fiber in diet (high fiber foods = vegetables, leafy greens, oats/grains) - May take OTC Fiber supplement (metamucil powder or pill/gummy) - May try OTC Probiotic  Ask Dr Mike Gip about the [facility]  May consider stopping the Ibandronate Jaclyn Prime) your last DEXA was in 09/2016 and it showed improvement. Since >5 years you may consider stopping for drug holiday for 1-2 years to re-check DEXA and see if improved, but since this was driven by your Cancer doctor I would recommend that they assist with this decision.  Colon Cancer Screening: - For all adults age 60+ routine colon cancer screening is highly recommended.     - Recent guidelines from [place] recommend starting age of 31 - Early detection of colon cancer is important, because often there are no warning signs or symptoms, also if found early usually it can be cured. Late stage is hard to treat.  - If you  are not interested in Colonoscopy screening (if done and normal you could be cleared for 5 to 10 years until next due), then Cologuard is an excellent alternative for screening test for Colon Cancer. It is highly sensitive for detecting DNA of colon cancer from even the earliest stages. Also, there is NO bowel prep required. - If Cologuard is NEGATIVE, then it is good for 3 years before next due - If Cologuard is POSITIVE, then it is strongly advised to get a Colonoscopy, which allows the GI doctor to locate the source of the cancer or polyp (even very early stage) and treat it by removing it. ------------------------- If you would like to proceed with Cologuard (stool DNA test) - FIRST, call your insurance company and tell them you want to check cost of Cologuard tell them CPT Code [phone] (it may be completely covered and you could get for no cost, OR max cost without any coverage is about $600). Also, keep in mind if you do NOT open the kit, and decide not to do the test, you will NOT be charged, you should contact the company if you decide not to do the test. - If you want to proceed, you can notify us (phone message, [place], or at next visit) and we will order it for you. The test kit will be delivered to you house within about 1 week. Follow instructions to collect sample,  you may call the company for any help or questions, 24/7 telephone support at [phone].   Please schedule a Follow-up Appointment to: Return in about 2 weeks (around 05/23/2017), or if symptoms worsen or fail to improve, for constipation.  If you have any other questions or concerns, please feel free to call the office or send a message through [place]. You may also schedule an earlier appointment if necessary.  Additionally, you may be receiving a survey about your experience at our office within a few days to 1 week by e-mail or mail. We value your feedback.  Nobie Putnam, DO [place]

## 2017-05-09 NOTE — Progress Notes (Signed)
Subjective:    Patient ID: Angela Bullock, female    DOB: [birth year], 82 y.o.   MRN: [id number]  Angela Bullock is a 82 y.o. female presenting on 05/09/2017 for GI Problem (Changes in bowels for 2 weeks)   HPI   BOWEL HABIT CHANGE / LOOSE STOOL / History of Constipation  Reports some recent bowel changes over past 2 weeks, she was worried about this and wanted to discuss further. - Normal bowel habits have been harder dry stool with smaller pellets and amount and then would have more well firmed stool, somewhat irregular habit with one BM maybe once daily or sometimes every other day  - Recently she reports now she had some anxiety and stress due to death in the family, and her appetite reduced. She had a sudden change in bowel movement with "sudden" and more urgent and soft and looser. She had some watery stool initially, took [place]. Describes frequency with 2-3 x soft bowel movements in AM usually then no BMs later in the day. - Takes prunes regularly, no other laxatives or OTC medicines - In the past she has had issues with hemorrhoids, with some small amt bright red blood on toilet paper, not in stool or in water, she has NOT seen any blood with current episodes Last Colonoscopy in 2010, Dr Estelle June [facility]) x 1 polyp benign, she does not have copy of report. - Admits some still reduced appetite and stress, increased gas and bloating - Denies fever chills, sweats, abdominal pain, nausea vomiting  Prior breast cancer >5 years ago. Followed by Oncology. She is concerned about metastatic disease and is worried about this, despite good check up through Oncology, last 10/2016, with history of Stage I L Breast cancer, s/p partial mastectomy, Mammosite radiation, and Arimidex therapy then discontinued after 1 year. - She has follow-up in 1 week with Oncology - She has Osteopenia, and is asking about Ibandronate medicine if still needs, on >5 yr  Additionally - She lost family  member recently few weeks ago, and has been stressed and with some adjustment depressed mood at times, only episodic, she is managing and coping with it well, see PHQ score below, no prior history of major depression.  Depression screen [facility] 2/9 05/09/2017 02/11/2017 07/31/2016  Decreased Interest 1 0 0  Down, Depressed, Hopeless 1 0 0  PHQ - 2 Score 2 0 0  Altered sleeping 0 - -  Tired, decreased energy 3 - -  Change in appetite 2 - -  Feeling bad or failure about yourself  0 - -  Trouble concentrating 0 - -  Moving slowly or fidgety/restless 0 - -  Suicidal thoughts 0 - -  PHQ-9 Score 7 - -  Difficult doing work/chores Not difficult at all - -    Social History   Tobacco Use  . Smoking status: Never Smoker  . Smokeless tobacco: Never Used  Substance Use Topics  . Alcohol use: No    Alcohol/week: 0.0 oz  . Drug use: No    Review of Systems Per HPI unless specifically indicated above     Objective:    BP 130/68 (BP Location: Right Arm, Patient Position: Sitting, Cuff Size: Normal)   Pulse 83   Temp 98.1 F (36.7 C)   Resp 16   Ht 5\' 3"  (1.6 m)   Wt 187 lb (84.8 kg)   BMI 33.13 kg/m   Wt Readings from Last 3 Encounters:  05/09/17 187 lb (84.8  kg)  02/11/17 195 lb (88.5 kg)  11/15/16 193 lb (87.5 kg)    Physical Exam  Constitutional: She is oriented to person, place, and time. She appears well-developed and well-nourished. No distress.  Well-appearing, comfortable, cooperative  HENT:  Head: Normocephalic and atraumatic.  Mouth/Throat: Oropharynx is clear and moist.  Eyes: Conjunctivae are normal.  Cardiovascular: Normal rate and intact distal pulses.  No murmur heard. Pulmonary/Chest: Effort normal and breath sounds normal. No respiratory distress. She has no wheezes. She has no rales.  Abdominal: Soft. She exhibits no distension and no mass. There is no tenderness. There is no guarding.  Bowel sounds slightly distant at times and more tinkling, but does have  occasional normal sounds mixed in.  Musculoskeletal: Normal range of motion. She exhibits no edema.  Neurological: She is alert and oriented to person, place, and time.  Skin: Skin is warm and dry. No rash noted. She is not diaphoretic. No erythema.  Psychiatric: She has a normal mood and affect. Her behavior is normal.  Well groomed, good eye contact, normal speech and thoughts. Does not appear sad today. Not anxious.  Nursing note and vitals reviewed.  I have personally reviewed the radiology report from 05/09/17 KUB Abd X-ray.  CLINICAL DATA:  Acute change in bowel habits over the last 2 weeks, some abdominal bloating  EXAM: ABDOMEN - 1 VIEW  COMPARISON:  Lumbar spine films of 10/28/2015  FINDINGS: Supine views of the abdomen show no bowel obstruction. There is a significant lumbar scoliosis present with degenerative change. Also within the right upper quadrant there is a large calcified gallstone noted of approximately 3.0 cm in diameter.  IMPRESSION: 1. No bowel obstruction. 2. Stable moderate thoracolumbar scoliosis with degenerative change. 3. Large gallstone in the right upper quadrant of 3 cm in diameter.   Electronically Signed   By: Ivar Drape M.D.   On: 05/09/2017 14:38   Results for orders placed or performed in visit on 02/05/17  VITAMIN D 25 Hydroxy (Vit-D Deficiency, Fractures)  Result Value Ref Range   Vit D, 25-Hydroxy 47 30 - 100 ng/mL  BASIC METABOLIC PANEL WITH GFR  Result Value Ref Range   Glucose, Bld 95 65 - 99 mg/dL   BUN 25 7 - 25 mg/dL   Creat 1.12 (H) 0.60 - 0.93 mg/dL   GFR, Est Non African American 47 (L) > OR = 60 mL/min/1.25m2   GFR, Est African American 54 (L) > OR = 60 mL/min/1.38m2   BUN/Creatinine Ratio 22 6 - 22 (calc)   Sodium 140 135 - 146 mmol/L   Potassium 4.3 3.5 - 5.3 mmol/L   Chloride 106 98 - 110 mmol/L   CO2 27 20 - 32 mmol/L   Calcium 9.6 8.6 - 10.4 mg/dL  Lipid panel  Result Value Ref Range   Cholesterol 153  <200 mg/dL   HDL 59 >50 mg/dL   Triglycerides 116 <150 mg/dL   LDL Cholesterol (Calc) 74 mg/dL (calc)   Total CHOL/HDL Ratio 2.6 <5.0 (calc)   Non-HDL Cholesterol (Calc) 94 <130 mg/dL (calc)  Hemoglobin A1c  Result Value Ref Range   Hgb A1c MFr Bld 5.4 <5.7 % of total Hgb   Mean Plasma Glucose 108 (calc)   eAG (mmol/L) 6.0 (calc)      Assessment & Plan:   Problem List Items Addressed This Visit    None    Visit Diagnoses    Change in bowel function    -  Primary   Relevant  Orders   DG Abd 1 View   Abdominal bloating       Relevant Orders   DG Abd 1 View   Slow transit constipation       Relevant Medications   polyethylene glycol powder (GLYCOLAX/MIRALAX) powder   Other Relevant Orders   DG Abd 1 View   Bereavement reaction          Clinically consistent with bowel habit changes, prior constipation by history now 2 week of acute change with some loose stool and urgency, seems related to life stressor with family member loss (adjustment/bereavement) and appetite change with reduced appetite. - Suspect may have deeper constipation with some encopresis now is possible - No other GI red flag symptoms. Overall reassuring history. - Abdominal exam is benign, possibly some inc gas / bloating - On probiotic, limited other medicines, on prunes for fiber - Unlikely to be related to history of breast cancer. She has history of last colonoscopy 2010 out of town, negative with x 1 benign polyp, again less likely to be due to primary colon malignancy, reassurance given at this time  [place] in office today, f/u results - **UPDATE KUB results above, no obvious acute obstruction of bowel or significant constipation, some stool burden, known thoracolumbar scoliosis DJD, and Large 3 cm RUQ gallstone - released result to mychart, advised continue miralax as planned, if not improving notify office may need repeat colonoscopy or other eval  - Start rx Miralax powder 17g daily titrate up  as needed, goal to help clean out and reset bowels, possibly clear any deeper constipation - Continue fiber, improve diet, improve hydration, may continue probiotic - Monitor symptoms for other change - Consider repeat Colonoscopy (if request referral to GI) otherwise may consider Cologuard as a screening, however my concern is that given symptomatic with bowel change this may not be ideal but it would still be an option, especially if this symptom improves and bowel habits resume - Follow-up as needed few weeks if not resolved    Meds ordered this encounter  Medications  . polyethylene glycol powder (GLYCOLAX/MIRALAX) powder    Sig: Take 17 g by mouth daily as needed for mild constipation or moderate constipation. Increase dose as advised    Dispense:  250 g    Refill:  0    Follow up plan: Return in about 2 weeks (around 05/23/2017), or if symptoms worsen or fail to improve, for constipation.  Nobie Putnam, [facility] 05/09/2017, 10:32 AM

## 2017-05-12 ENCOUNTER — Other Ambulatory Visit: Payer: Self-pay | Admitting: Family Medicine

## 2017-05-12 DIAGNOSIS — M25561 Pain in right knee: Principal | ICD-10-CM

## 2017-05-12 DIAGNOSIS — M150 Primary generalized (osteo)arthritis: Secondary | ICD-10-CM

## 2017-05-12 DIAGNOSIS — M159 Polyosteoarthritis, unspecified: Secondary | ICD-10-CM

## 2017-05-12 DIAGNOSIS — M25562 Pain in left knee: Principal | ICD-10-CM

## 2017-05-12 DIAGNOSIS — M545 Low back pain, unspecified: Secondary | ICD-10-CM

## 2017-05-12 DIAGNOSIS — G8929 Other chronic pain: Secondary | ICD-10-CM

## 2017-05-16 ENCOUNTER — Other Ambulatory Visit: Payer: Medicare Other

## 2017-05-16 ENCOUNTER — Ambulatory Visit: Payer: Medicare Other | Admitting: Hematology and Oncology

## 2017-05-17 ENCOUNTER — Telehealth: Payer: Self-pay

## 2017-05-17 ENCOUNTER — Other Ambulatory Visit: Payer: Self-pay | Admitting: Family Medicine

## 2017-05-17 DIAGNOSIS — Z1211 Encounter for screening for malignant neoplasm of colon: Secondary | ICD-10-CM

## 2017-05-17 DIAGNOSIS — R195 Other fecal abnormalities: Secondary | ICD-10-CM

## 2017-05-17 DIAGNOSIS — K5900 Constipation, unspecified: Secondary | ICD-10-CM

## 2017-05-17 NOTE — Addendum Note (Signed)
Addended by: Olin Hauser on: 05/17/2017 03:31 PM   Modules accepted: Orders

## 2017-05-17 NOTE — Telephone Encounter (Signed)
Will order cologuard for now screening.  She should still see GI to discuss symptoms in review.  She may follow-up as needed.  Nobie Putnam, [facility] 05/17/2017, 3:30 PM

## 2017-05-17 NOTE — Telephone Encounter (Signed)
The pt called back requesting a cologuard order to be place, because they called her from [place] and told her they couldn't get her in until June. The pt is concern that this is to far off out and would prefer to go ahead and do the cologuard. Please advise

## 2017-05-17 NOTE — Progress Notes (Signed)
am

## 2017-05-20 NOTE — Telephone Encounter (Signed)
The pt was notified, no questions or concern.

## 2017-05-30 ENCOUNTER — Inpatient Hospital Stay (HOSPITAL_BASED_OUTPATIENT_CLINIC_OR_DEPARTMENT_OTHER): Payer: Medicare Other | Admitting: Hematology and Oncology

## 2017-05-30 ENCOUNTER — Inpatient Hospital Stay: Payer: Medicare Other | Attending: Hematology and Oncology

## 2017-05-30 ENCOUNTER — Encounter: Payer: Self-pay | Admitting: Hematology and Oncology

## 2017-05-30 VITALS — BP 146/77 | HR 64 | Temp 98.0°F | Resp 18 | Wt 190.2 lb

## 2017-05-30 DIAGNOSIS — Z853 Personal history of malignant neoplasm of breast: Secondary | ICD-10-CM | POA: Insufficient documentation

## 2017-05-30 DIAGNOSIS — I10 Essential (primary) hypertension: Secondary | ICD-10-CM | POA: Insufficient documentation

## 2017-05-30 DIAGNOSIS — Z803 Family history of malignant neoplasm of breast: Secondary | ICD-10-CM | POA: Diagnosis not present

## 2017-05-30 DIAGNOSIS — Z7189 Other specified counseling: Secondary | ICD-10-CM | POA: Insufficient documentation

## 2017-05-30 DIAGNOSIS — M85851 Other specified disorders of bone density and structure, right thigh: Secondary | ICD-10-CM | POA: Insufficient documentation

## 2017-05-30 DIAGNOSIS — C50912 Malignant neoplasm of unspecified site of left female breast: Secondary | ICD-10-CM

## 2017-05-30 DIAGNOSIS — Z923 Personal history of irradiation: Secondary | ICD-10-CM

## 2017-05-30 DIAGNOSIS — Z170 Estrogen receptor positive status [ER+]: Secondary | ICD-10-CM

## 2017-05-30 LAB — CBC WITH DIFFERENTIAL/PLATELET
BASOS PCT: 1 %
Basophils Absolute: 0 10*3/uL (ref 0–0.1)
Eosinophils Absolute: 0.1 10*3/uL (ref 0–0.7)
Eosinophils Relative: 3 %
HEMATOCRIT: 39.5 % (ref 35.0–47.0)
HEMOGLOBIN: 13.4 g/dL (ref 12.0–16.0)
LYMPHS ABS: 0.9 10*3/uL — AB (ref 1.0–3.6)
LYMPHS PCT: 22 %
MCH: 31.8 pg (ref 26.0–34.0)
MCHC: 33.9 g/dL (ref 32.0–36.0)
MCV: 93.8 fL (ref 80.0–100.0)
MONOS PCT: 9 %
Monocytes Absolute: 0.4 10*3/uL (ref 0.2–0.9)
NEUTROS ABS: 2.7 10*3/uL (ref 1.4–6.5)
Neutrophils Relative %: 65 %
Platelets: 156 10*3/uL (ref 150–440)
RBC: 4.2 MIL/uL (ref 3.80–5.20)
RDW: 13.3 % (ref 11.5–14.5)
WBC: 4.1 10*3/uL (ref 3.6–11.0)

## 2017-05-30 LAB — COMPREHENSIVE METABOLIC PANEL
ALBUMIN: 3.9 g/dL (ref 3.5–5.0)
ALT: 15 U/L (ref 14–54)
ANION GAP: 11 (ref 5–15)
AST: 21 U/L (ref 15–41)
Alkaline Phosphatase: 62 U/L (ref 38–126)
BUN: 24 mg/dL — ABNORMAL HIGH (ref 6–20)
CALCIUM: 10.1 mg/dL (ref 8.9–10.3)
CHLORIDE: 108 mmol/L (ref 101–111)
CO2: 18 mmol/L — AB (ref 22–32)
Creatinine, Ser: 1.28 mg/dL — ABNORMAL HIGH (ref 0.44–1.00)
GFR calc Af Amer: 45 mL/min — ABNORMAL LOW (ref >60)
GFR calc non Af Amer: 39 mL/min — ABNORMAL LOW (ref >60)
GLUCOSE: 98 mg/dL (ref 65–99)
POTASSIUM: 4.3 mmol/L (ref 3.5–5.1)
SODIUM: 137 mmol/L (ref 135–145)
Total Bilirubin: 1 mg/dL (ref 0.3–1.2)
Total Protein: 6.9 g/dL (ref 6.5–8.1)

## 2017-05-30 NOTE — Progress Notes (Signed)
[facility] day:  05/30/2017   Chief Complaint: Angela Bullock is a 82 y.o. female with stage I left breast cancer who is seen for 6 month assessment.  HPI: The patient was last seen in the medical oncology clinic on10/10/2016.  At that time, she was doing well. She denied new breast concerns. Exam revealed a recurrent cyst in the left breast/axilla that was intermittently tender.  Mammogram was negative.  Bone density revealed osteopenia.  She is on Boniva.  CA27.29 was normal.  During the interim, patient is doing well overall. She has not acute concerns today.  Patient does not verbalize any concerns with regards to her breasts today. She has no B symptoms. Patient denies recent infections. Patient is not drinking as much as she normally. Labs today were difficult to collect.   Patient is eating well. Her weight has increased 3 pounds. Patient wishing to discuss bisphosphonate therapy. Patient has been on [place] for 5 years.   She denies pain in the clinic today.    Past Medical History:  Diagnosis Date  . Allergy   . Breast cancer ([place]) 01/30/2012   left, radiation and lumpectomy  . Hypertension   . Personal history of radiation therapy     Past Surgical History:  Procedure Laterality Date  . ABDOMINAL HYSTERECTOMY    . BREAST BIOPSY Left 01/30/2011   negative  . BREAST BIOPSY Left 01/30/2012   positive  . BREAST CYST ASPIRATION    . BREAST LUMPECTOMY Left 04/02/2012   positive    Family History  Problem Relation Age of Onset  . Breast cancer Paternal Aunt 85  . Osteoporosis Mother   . Heart attack Father     Social History:  reports that she has never smoked. She has never used smokeless tobacco. She reports that she does not drink alcohol or use drugs.  The patient lives in [place].  The patient is alone today.  Allergies:  Allergies  Allergen Reactions  . Penicillins Rash    Current Medications: Current Outpatient  Medications  Medication Sig Dispense Refill  . aspirin EC 81 MG tablet Take 1 tablet (81 mg total) by mouth daily.    Marland Kitchen atorvastatin (LIPITOR) 10 MG tablet Take 1 tablet (10 mg total) by mouth at bedtime. 90 tablet 3  . Biotin 10 MG CAPS Take 10 mg by mouth daily.    . calcium carbonate (OSCAL) 1500 (600 CA) MG TABS tablet Take by mouth 2 (two) times daily with a meal.    . cholecalciferol (VITAMIN D) 1000 UNITS tablet Take 1,000 Units by mouth daily.    Marland Kitchen co-enzyme Q-10 50 MG capsule Take 50 mg by mouth daily.    Marland Kitchen glucosamine-chondroitin 500-400 MG tablet Take 1 tablet by mouth 3 (three) times daily.    Marland Kitchen ibandronate (BONIVA) 150 MG tablet TAKE 1 TABLET BY MOUTH MONTHLY 3 tablet 5  . lisinopril (PRINIVIL,ZESTRIL) 10 MG tablet Take 1 tablet (10 mg total) by mouth daily. 90 tablet 3  . meloxicam (MOBIC) 15 MG tablet TAKE 1 TABLET BY MOUTH DAILY AS NEEDED FOR PAIN. PREFER TO TAKE UP TO 1 TO 2 WEEKS AT A TIME THEN STOP 30 tablet 2  . metoprolol tartrate (LOPRESSOR) 25 MG tablet TAKE 1/2 TABLET(12.5 MG) BY MOUTH TWICE DAILY 90 tablet 3  . Multiple Vitamins-Minerals (CENTRUM SILVER PO) Take 1 tablet by mouth daily.    . Omega-3 Krill Oil 300 MG CAPS Take 1 capsule  by mouth daily.    . polyethylene glycol powder (GLYCOLAX/MIRALAX) powder Take 17 g by mouth daily as needed for mild constipation or moderate constipation. Increase dose as advised 250 g 0  . Probiotic Product ([place]) CAPS Take by mouth.    . traMADol (ULTRAM) 50 MG tablet TAKE 1 TO 2 TABLETS(50 TO 100 MG) BY MOUTH EVERY 8 HOURS AS NEEDED FOR MODERATE PAIN 30 tablet 0   No current facility-administered medications for this visit.     Review of Systems:  GENERAL:  Feels "ok".  No fevers or sweats. Weight up 3 pounds.  PERFORMANCE STATUS (ECOG):  1 HEENT:  No visual changes, runny nose, sore throat, mouth sores or tenderness. Lungs: No shortness of breath or cough.  No hemoptysis. Cardiac:  No chest pain, palpitations,  orthopnea, or PND. GI:  No nausea, vomiting, diarrhea, constipation, melena or hematochezia. GU:  No urgency, frequency, dysuria, or hematuria. Musculoskeletal:  No back pain.  No joint pain.  No muscle tenderness. Extremities:  No pain or swelling. Skin:  No rashes or skin changes. Neuro:  No headache, numbness or weakness, balance or coordination issues. Endocrine:  No diabetes, thyroid issues, hot flashes or night sweats. Psych:  No mood changes, depression or anxiety. Pain:  No focal pain. Review of systems:  All other systems reviewed and found to be negative.  Physical Exam: Blood pressure (!) 146/77, pulse 64, temperature 98 F (36.7 C), temperature source Tympanic, resp. rate 18, weight 190 lb 4 oz (86.3 kg). GENERAL:  Well developed, well nourished, woman sitting comfortably in the exam room in no acute distress. MENTAL STATUS:  Alert and oriented to person, place and time. HEAD:  Thin gray hair.  Normocephalic, atraumatic, face symmetric, no Cushingoid features. EYES:  Blue eyes.  Pupils equal round and reactive to light and accomodation.  No conjunctivitis or scleral icterus. ENT:  Oropharynx clear without lesion.  Tongue normal. Mucous membranes moist.  RESPIRATORY:  Clear to auscultation without rales, wheezes or rhonchi. CARDIOVASCULAR:  Regular rate and rhythm without murmur, rub or gallop. BREAST:  Right breast without masses, skin changes or nipple discharge.  Left breast with 2.5 cm firmness above lateral incision (pronounced) at 3 o'clock position.  No masses, skin changes or nipple discharge. ABDOMEN:  Soft, non-tender, with active bowel sounds, and no appreciable hepatosplenomegaly.  No masses. SKIN:  No rashes, ulcers or lesions. EXTREMITIES: No edema, no skin discoloration or tenderness.  No palpable cords. LYMPH NODES: No palpable cervical, supraclavicular, axillary or inguinal adenopathy  NEUROLOGICAL: Unremarkable. PSYCH:  Appropriate.    Appointment on  05/30/2017  Component Date Value Ref Range Status  . Sodium 05/30/2017 137  135 - 145 mmol/L Final  . Potassium 05/30/2017 4.3  3.5 - 5.1 mmol/L Final  . Chloride 05/30/2017 108  101 - 111 mmol/L Final  . CO2 05/30/2017 18* 22 - 32 mmol/L Final  . Glucose, Bld 05/30/2017 98  65 - 99 mg/dL Final  . BUN 05/30/2017 24* 6 - 20 mg/dL Final  . Creatinine, Ser 05/30/2017 1.28* 0.44 - 1.00 mg/dL Final  . Calcium 05/30/2017 10.1  8.9 - 10.3 mg/dL Final  . Total Protein 05/30/2017 6.9  6.5 - 8.1 g/dL Final  . Albumin 05/30/2017 3.9  3.5 - 5.0 g/dL Final  . AST 05/30/2017 21  15 - 41 U/L Final  . ALT 05/30/2017 15  14 - 54 U/L Final  . Alkaline Phosphatase 05/30/2017 62  38 - 126 U/L Final  .  Total Bilirubin 05/30/2017 1.0  0.3 - 1.2 mg/dL Final  . GFR calc non Af Amer 05/30/2017 39* >60 mL/min Final  . GFR calc Af Amer 05/30/2017 45* >60 mL/min Final   Comment: (NOTE) The eGFR has been calculated using the CKD EPI equation. This calculation has not been validated in all clinical situations. eGFR's persistently <60 mL/min signify possible Chronic Kidney Disease.   Georgiann Hahn gap 05/30/2017 11  5 - 15 Final   Performed at [facility], [address] [place], [place] [address]  . WBC 05/30/2017 4.1  3.6 - 11.0 K/uL Final  . RBC 05/30/2017 4.20  3.80 - 5.20 MIL/uL Final  . Hemoglobin 05/30/2017 13.4  12.0 - 16.0 g/dL Final  . HCT 05/30/2017 39.5  35.0 - 47.0 % Final  . MCV 05/30/2017 93.8  80.0 - 100.0 fL Final  . MCH 05/30/2017 31.8  26.0 - 34.0 pg Final  . MCHC 05/30/2017 33.9  32.0 - 36.0 g/dL Final  . RDW 05/30/2017 13.3  11.5 - 14.5 % Final  . Platelets 05/30/2017 156  150 - 440 K/uL Final  . Neutrophils Relative % 05/30/2017 65  % Final  . Neutro Abs 05/30/2017 2.7  1.4 - 6.5 K/uL Final  . Lymphocytes Relative 05/30/2017 22  % Final  . Lymphs Abs 05/30/2017 0.9* 1.0 - 3.6 K/uL Final  . Monocytes Relative 05/30/2017 9  % Final  . Monocytes Absolute 05/30/2017 0.4  0.2 - 0.9 K/uL  Final  . Eosinophils Relative 05/30/2017 3  % Final  . Eosinophils Absolute 05/30/2017 0.1  0 - 0.7 K/uL Final  . Basophils Relative 05/30/2017 1  % Final  . Basophils Absolute 05/30/2017 0.0  0 - 0.1 K/uL Final   Performed at [facility], [address], [place], [place] [address]    Assessment:  EDISON WOLLSCHLAGER is a 82 y.o. female with a stage I left breast cancer status post excisional biopsy on 04/02/2012. Pathology revealed a 1.3 cm moderately differentiated basal cell carcinoma. There was no DCIS or vascular invasion. Tumor extended to the lateral margin.  Pathology was ER +100%, PR +73% and HER-2/neu 1+.  She then underwent left partial mastectomy with sentinel lymph node biopsy on 05/02/2012.  Pathology revealed no evidence of residual malignancy. Margins were negative. Zero of 6 sentinel lymph nodes were positive. Pathologic stage was [id number].  She underwent Mammosite radiation. She began Arimidex on 05/2012. She took Arimidex for approximately 1 year. She discontinued Arimidex in 05/2013 as she did not like the side effect profile.  She decided against any other hormonal therapy.   Mammogram and right ultrasound on 10/17/2015 revealed a 1.8 cm benign cyst within the left breast at the 12 o'clock retroareolar location. Bilateral diagnostic mammogram on 10/17/2016 revealed no evidence of malignancy in either breast.  CA27.29 has been followed: 33.7 on 10/21/2014, 26.1 on 04/28/2015, 23.2 on 10/28/2015, 26.9 on 05/08/2016, 28 on 11/15/2016, and 24.5 on 05/30/2017.  Bone density study on 10/08/2012 revealed osteopenia (T score -1.8 in left femoral neck).  Bone density study on 10/14/2014 revealed a T-score of -1.7 in the right femur neck.  Bone density study done on 10/17/2016 revealed a T score of -1.6 in the right femoral neck. She is on calcium and vitamin D.  She has been on Boniva x 5 years.  Symptomatically, she is doing well. She denies acute complaints. Exam reveals a new area  of firmness in her LEFT breast at the 3 o'clock position.  Plan: 1.  Labs today:  CBC with diff, CMP, CA27.29. 2.  Discuss osteopenia.  Continue calcium and vitamin D.  Patient on Boniva.  Discuss holiday per guidelines. 3.  Schedule bilateral diagnostic mammogram and ultrasound.  4.  RTC in 6 months for MD assessment and labs (CBC with diff, CMP, CA27.29).   Honor Loh, NP  05/30/2017 3:47 PM   I saw and evaluated the patient, participating in the key portions of the service and reviewing pertinent diagnostic studies and records.  I reviewed the nurse practitioner's note and agree with the findings and the plan.  The assessment and plan were discussed with the patient.  A few questions were asked by the patient and answered.   Nolon Stalls, MD 05/30/2017,3:47 PM

## 2017-05-30 NOTE — Progress Notes (Signed)
Pt in for follow up.  Pt states "feeling good".  Denies any difficulties or concerns.

## 2017-05-31 LAB — CANCER ANTIGEN 27.29: CAN 27.29: 24.5 U/mL (ref 0.0–38.6)

## 2017-06-03 ENCOUNTER — Encounter: Payer: Self-pay | Admitting: Hematology and Oncology

## 2017-06-10 ENCOUNTER — Ambulatory Visit
Admission: RE | Admit: 2017-06-10 | Discharge: 2017-06-10 | Disposition: A | Discharge status: Home / Self Care | Payer: Medicare Other | Source: Ambulatory Visit | Attending: Urgent Care | Admitting: Urgent Care

## 2017-06-10 DIAGNOSIS — C50912 Malignant neoplasm of unspecified site of left female breast: Secondary | ICD-10-CM | POA: Insufficient documentation

## 2017-06-10 DIAGNOSIS — Z170 Estrogen receptor positive status [ER+]: Secondary | ICD-10-CM | POA: Diagnosis not present

## 2017-06-10 DIAGNOSIS — N6002 Solitary cyst of left breast: Secondary | ICD-10-CM | POA: Diagnosis not present

## 2017-06-10 DIAGNOSIS — R922 Inconclusive mammogram: Secondary | ICD-10-CM | POA: Diagnosis not present

## 2017-07-04 ENCOUNTER — Ambulatory Visit (INDEPENDENT_AMBULATORY_CARE_PROVIDER_SITE_OTHER): Payer: Medicare Other | Admitting: Gastroenterology

## 2017-07-04 ENCOUNTER — Encounter: Payer: Self-pay | Admitting: Gastroenterology

## 2017-07-04 VITALS — BP 122/70 | HR 83 | Ht 63.0 in | Wt 188.4 lb

## 2017-07-04 DIAGNOSIS — R197 Diarrhea, unspecified: Secondary | ICD-10-CM

## 2017-07-04 NOTE — Progress Notes (Signed)
Jonathon Bellows MD, MRCP(U.K) [address]  [place], [place] [address]  Main: [phone]  Fax: [phone]   Gastroenterology Consultation  Referring Provider:     Nobie Putnam * Primary Care Physician:  Olin Hauser, DO Primary Gastroenterologist:  Dr. Jonathon Bellows  Reason for Consultation:   Constipation         HPI:   Angela Bullock is a 82 y.o. y/o female referred for consultation & management  by Dr. Parks Ranger, Devonne Doughty, DO.     She has been referred for Constipation. She has a history of breast cancer.   She says she is here for diarrhea  Diarrhea :  Onset: started all of a sudden in 03/2017 , felt she was under a lot of stress after death of her cousin was the cause , did not clear up .    Number of bowel movements a day : from 1-4 times a day    Color : varies brown to darker color -   Consistency:  Water and sometimes like pudding  Present status: ongoing    Shape of stool:  Sometimes no shape and sometimes yes    Weight loss:  Trying to lose weight but not lost  Prior colonoscopy:  Last in 2010 - normal -?1 polyp  Artificial sugars/sodas/chewing gum:  splenda - some days none and some days a few, diet soda- 10-12 oz a day , no crystallite, no gum , no hard candy , some cookies every day .    Bloating:  No   Gas:  Some gurgling  Antibiotic use: no   I plan to rule out infection with stool tests , if negative will proceed with a colonoscopy .    On probiotics since 2-3 years.    Past Medical History:  Diagnosis Date  . Allergy   . Breast cancer ([employer]) 01/30/2012   left, radiation and lumpectomy  . Hypertension   . Personal history of radiation therapy     Past Surgical History:  Procedure Laterality Date  . ABDOMINAL HYSTERECTOMY    . BREAST BIOPSY Left 01/30/2011   negative  . BREAST BIOPSY Left 01/30/2012   positive  . BREAST CYST ASPIRATION Left   . BREAST LUMPECTOMY Left 04/02/2012   positive    Prior to  Admission medications   Medication Sig Start Date End Date Taking? Authorizing Provider  aspirin EC 81 MG tablet Take 1 tablet (81 mg total) by mouth daily. 02/08/16  Yes Karamalegos, Devonne Doughty, DO  atorvastatin (LIPITOR) 10 MG tablet Take 1 tablet (10 mg total) by mouth at bedtime. 02/11/17  Yes Karamalegos, Devonne Doughty, DO  Biotin 10 MG CAPS Take 10 mg by mouth daily.   Yes [provider]  calcium carbonate (OSCAL) 1500 (600 CA) MG TABS tablet Take by mouth 2 (two) times daily with a meal.   Yes [provider]  cholecalciferol (VITAMIN D) 1000 UNITS tablet Take 1,000 Units by mouth daily.   Yes [provider]  co-enzyme Q-10 50 MG capsule Take 50 mg by mouth daily.   Yes [provider]  glucosamine-chondroitin 500-400 MG tablet Take 1 tablet by mouth 3 (three) times daily.   Yes [provider]  ibandronate (BONIVA) 150 MG tablet TAKE 1 TABLET BY MOUTH MONTHLY 12/06/16  Yes Karamalegos, Devonne Doughty, DO  lisinopril (PRINIVIL,ZESTRIL) 10 MG tablet Take 1 tablet (10 mg total) by mouth daily. 02/11/17  Yes Karamalegos, Devonne Doughty, DO  meloxicam (  MOBIC) 15 MG tablet TAKE 1 TABLET BY MOUTH DAILY AS NEEDED FOR PAIN. PREFER TO TAKE UP TO 1 TO 2 WEEKS AT A TIME THEN STOP 04/29/17  Yes Karamalegos, Devonne Doughty, DO  metoprolol tartrate (LOPRESSOR) 25 MG tablet TAKE 1/2 TABLET(12.5 MG) BY MOUTH TWICE DAILY 04/25/17  Yes Karamalegos, Devonne Doughty, DO  Multiple Vitamins-Minerals (CENTRUM SILVER PO) Take 1 tablet by mouth daily.   Yes [provider]  Omega-3 Krill Oil 300 MG CAPS Take 1 capsule by mouth daily.   Yes [provider]  Probiotic Product ([place]) CAPS Take by mouth.   Yes [provider]  traMADol (ULTRAM) 50 MG tablet TAKE 1 TO 2 TABLETS(50 TO 100 MG) BY MOUTH EVERY 8 HOURS AS NEEDED FOR MODERATE PAIN 05/13/17  Yes Olin Hauser, DO    Family History  Problem Relation Age of Onset  . Breast cancer  Paternal Aunt 85  . Osteoporosis Mother   . Heart attack Father      Social History   Tobacco Use  . Smoking status: Never Smoker  . Smokeless tobacco: Never Used  Substance Use Topics  . Alcohol use: No    Alcohol/week: 0.0 oz  . Drug use: No    Allergies as of 07/04/2017 - Review Complete 07/04/2017  Allergen Reaction Noted  . Penicillins Rash 10/28/2015    Review of Systems:    All systems reviewed and negative except where noted in HPI.   Physical Exam:  BP 122/70 (BP Location: Left Arm, Patient Position: Sitting, Cuff Size: Large)   Pulse 83   Ht 5\' 3"  (1.6 m)   Wt 188 lb 6.4 oz (85.5 kg)   BMI 33.37 kg/m  No LMP recorded. Patient has had a hysterectomy. Psych:  Alert and cooperative. Normal mood and affect. General:   Alert,  Well-developed, well-nourished, pleasant and cooperative in NAD Head:  Normocephalic and atraumatic. Eyes:  Sclera clear, no icterus.   Conjunctiva pink. Ears:  Normal auditory acuity. Nose:  No deformity, discharge, or lesions. Mouth:  No deformity or lesions,oropharynx pink & moist. Neck:  Supple; no masses or thyromegaly. Lungs:  Respirations even and unlabored.  Clear throughout to auscultation.   No wheezes, crackles, or rhonchi. No acute distress. Heart:  Regular rate and rhythm; no murmurs, clicks, rubs, or gallops. Abdomen:  Normal bowel sounds.  No bruits.  Soft, non-tender and non-distended without masses, hepatosplenomegaly or hernias noted.  No guarding or rebound tenderness.    Neurologic:  Alert and oriented x3;  grossly normal neurologically. Skin:  Intact without significant lesions or rashes. No jaundice. Lymph Nodes:  No significant cervical adenopathy. Psych:  Alert and cooperative. Normal mood and affect.  Imaging Studies: US Breast Ltd Uni Left Inc Axilla  Result Date: 06/10/2017 CLINICAL DATA:  Patient presents with a reported palpable lump for ridge along the lumpectomy site of her upper outer left breast. Patient  underwent a left lumpectomy in March 2014. EXAM: DIGITAL DIAGNOSTIC LEFT MAMMOGRAM WITH CAD AND TOMO ULTRASOUND LEFT BREAST COMPARISON:  Previous exam(s). ACR Breast Density Category c: The breast tissue is heterogeneously dense, which may obscure small masses. FINDINGS: In the retroareolar left breast, there is an oval circumscribed mass with adjacent or associated punctate calcifications, which has increased in size over the last several years. Density and distortion in the posterior, upper outer left breast, reflecting the postsurgical scarring, is stable from the more recent prior mammograms. There is no mass the lumpectomy site and there  are no new or suspicious calcifications. Mammographic images were processed with CAD. On physical exam, there is a prominent firm lump along the upper outer left breast corresponding to the lumpectomy scar. There are 2 sebaceous cysts in the skin of the left axilla. The retroareolar mass seen mammographically is not palpable. Targeted ultrasound is performed, showing heterogeneous fibroglandular tissue in the upper outer left breast. In the area of the palpable lump, there is irregular hypoechogenicity with shadowing consistent with postsurgical scarring. There is an echogenic linear area within this consistent with avascular surgical clip. Two sebaceous cyst lie above this in the skin of the left axilla. No enlarged or abnormal left axillary lymph nodes. Sonographic evaluation of the left breast at 12 o'clock, 1 cm from the nipple, retroareolar region, demonstrates a simple cyst measuring 19 x 11 x 15 mm, consistent in size, shape and location to the mammographic finding. IMPRESSION: 1. No evidence of recurrent or new breast malignancy. 2. Benign postsurgical changes on the left. 3. Benign cyst in the retroareolar left breast. RECOMMENDATION: 1. Diagnostic mammography in 1 year per standard post lumpectomy protocol. 2. Continue self-examination. If the prominent area  corresponding to the lumpectomy scar appears to be enlarging, repeat imaging, possibly to include breast MRI, would be indicated. I have discussed the findings and recommendations with the patient. Results were also provided in writing at the conclusion of the visit. If applicable, a reminder letter will be sent to the patient regarding the next appointment. BI-RADS CATEGORY  2: Benign. Electronically Signed   By: Lajean Manes M.D.   On: 06/10/2017 10:40   Mm Diag Breast Tomo Uni Left  Result Date: 06/10/2017 CLINICAL DATA:  Patient presents with a reported palpable lump for ridge along the lumpectomy site of her upper outer left breast. Patient underwent a left lumpectomy in March 2014. EXAM: DIGITAL DIAGNOSTIC LEFT MAMMOGRAM WITH CAD AND TOMO ULTRASOUND LEFT BREAST COMPARISON:  Previous exam(s). ACR Breast Density Category c: The breast tissue is heterogeneously dense, which may obscure small masses. FINDINGS: In the retroareolar left breast, there is an oval circumscribed mass with adjacent or associated punctate calcifications, which has increased in size over the last several years. Density and distortion in the posterior, upper outer left breast, reflecting the postsurgical scarring, is stable from the more recent prior mammograms. There is no mass the lumpectomy site and there are no new or suspicious calcifications. Mammographic images were processed with CAD. On physical exam, there is a prominent firm lump along the upper outer left breast corresponding to the lumpectomy scar. There are 2 sebaceous cysts in the skin of the left axilla. The retroareolar mass seen mammographically is not palpable. Targeted ultrasound is performed, showing heterogeneous fibroglandular tissue in the upper outer left breast. In the area of the palpable lump, there is irregular hypoechogenicity with shadowing consistent with postsurgical scarring. There is an echogenic linear area within this consistent with avascular  surgical clip. Two sebaceous cyst lie above this in the skin of the left axilla. No enlarged or abnormal left axillary lymph nodes. Sonographic evaluation of the left breast at 12 o'clock, 1 cm from the nipple, retroareolar region, demonstrates a simple cyst measuring 19 x 11 x 15 mm, consistent in size, shape and location to the mammographic finding. IMPRESSION: 1. No evidence of recurrent or new breast malignancy. 2. Benign postsurgical changes on the left. 3. Benign cyst in the retroareolar left breast. RECOMMENDATION: 1. Diagnostic mammography in 1 year per standard post lumpectomy protocol. 2.  Continue self-examination. If the prominent area corresponding to the lumpectomy scar appears to be enlarging, repeat imaging, possibly to include breast MRI, would be indicated. I have discussed the findings and recommendations with the patient. Results were also provided in writing at the conclusion of the visit. If applicable, a reminder letter will be sent to the patient regarding the next appointment. BI-RADS CATEGORY  2: Benign. Electronically Signed   By: Lajean Manes M.D.   On: 06/10/2017 10:40    Assessment and Plan:   Angela Bullock is a 82 y.o. y/o female has been referred for diarrhea. Last colonoscopy in 2010 . On splenda, diet sodas , pro biotics which may cause an osmotic diarrhea .   Plan  1. Stop probiotics, splenda and diet sodas.  2. Colonoscopy with biopsies to r/p microscopic colitis/neoplasm I have discussed alternative options, risks & benefits,  which include, but are not limited to, bleeding, infection, perforation,respiratory complication & drug reaction.  The patient agrees with this plan & written consent will be obtained.   3. GI stool PCR and C diff   Follow up in 6 weeks   Dr Jonathon Bellows MD,MRCP(U.K)

## 2017-07-05 NOTE — Addendum Note (Signed)
Addended by: Peggye Ley on: 07/05/2017 03:36 PM   Modules accepted: Orders, SmartSet

## 2017-07-08 DIAGNOSIS — R197 Diarrhea, unspecified: Secondary | ICD-10-CM | POA: Diagnosis not present

## 2017-07-10 LAB — GI PROFILE, STOOL, PCR
Adenovirus F 40/41: NOT DETECTED
Astrovirus: NOT DETECTED
C DIFFICILE TOXIN A/B: NOT DETECTED
CRYPTOSPORIDIUM: NOT DETECTED
Campylobacter: NOT DETECTED
Cyclospora cayetanensis: NOT DETECTED
ENTEROTOXIGENIC E COLI: NOT DETECTED
Entamoeba histolytica: NOT DETECTED
Enteroaggregative E coli: NOT DETECTED
Enteropathogenic E coli: NOT DETECTED
Giardia lamblia: NOT DETECTED
NOROVIRUS GI/GII: NOT DETECTED
Plesiomonas shigelloides: NOT DETECTED
ROTAVIRUS A: NOT DETECTED
SALMONELLA: NOT DETECTED
SHIGA-TOXIN-PRODUCING E COLI: NOT DETECTED
SHIGELLA/ENTEROINVASIVE E COLI: NOT DETECTED
Sapovirus: NOT DETECTED
Vibrio cholerae: NOT DETECTED
Vibrio: NOT DETECTED
Yersinia enterocolitica: NOT DETECTED

## 2017-07-10 LAB — CLOSTRIDIUM DIFFICILE EIA: C difficile Toxins A+B, EIA: NEGATIVE

## 2017-07-14 ENCOUNTER — Encounter: Payer: Self-pay | Admitting: Gastroenterology

## 2017-07-23 ENCOUNTER — Ambulatory Visit
Admission: RE | Admit: 2017-07-23 | Discharge: 2017-07-23 | Disposition: A | Discharge status: Home / Self Care | Payer: Medicare Other | Source: Ambulatory Visit | Attending: Gastroenterology | Admitting: Gastroenterology

## 2017-07-23 ENCOUNTER — Encounter
Admission: RE | Disposition: A | Discharge status: Home / Self Care | Payer: Self-pay | Source: Ambulatory Visit | Attending: Gastroenterology

## 2017-07-23 ENCOUNTER — Ambulatory Visit: Payer: Medicare Other | Admitting: Certified Registered Nurse Anesthetist

## 2017-07-23 ENCOUNTER — Encounter: Payer: Self-pay | Admitting: Anesthesiology

## 2017-07-23 DIAGNOSIS — Z923 Personal history of irradiation: Secondary | ICD-10-CM | POA: Insufficient documentation

## 2017-07-23 DIAGNOSIS — Z853 Personal history of malignant neoplasm of breast: Secondary | ICD-10-CM | POA: Insufficient documentation

## 2017-07-23 DIAGNOSIS — I129 Hypertensive chronic kidney disease with stage 1 through stage 4 chronic kidney disease, or unspecified chronic kidney disease: Secondary | ICD-10-CM | POA: Diagnosis not present

## 2017-07-23 DIAGNOSIS — K5790 Diverticulosis of intestine, part unspecified, without perforation or abscess without bleeding: Secondary | ICD-10-CM | POA: Diagnosis not present

## 2017-07-23 DIAGNOSIS — Z7982 Long term (current) use of aspirin: Secondary | ICD-10-CM | POA: Insufficient documentation

## 2017-07-23 DIAGNOSIS — E785 Hyperlipidemia, unspecified: Secondary | ICD-10-CM | POA: Diagnosis not present

## 2017-07-23 DIAGNOSIS — I10 Essential (primary) hypertension: Secondary | ICD-10-CM | POA: Diagnosis not present

## 2017-07-23 DIAGNOSIS — Z79899 Other long term (current) drug therapy: Secondary | ICD-10-CM | POA: Diagnosis not present

## 2017-07-23 DIAGNOSIS — R197 Diarrhea, unspecified: Secondary | ICD-10-CM | POA: Diagnosis not present

## 2017-07-23 DIAGNOSIS — N183 Chronic kidney disease, stage 3 (moderate): Secondary | ICD-10-CM | POA: Diagnosis not present

## 2017-07-23 DIAGNOSIS — K5730 Diverticulosis of large intestine without perforation or abscess without bleeding: Secondary | ICD-10-CM | POA: Diagnosis not present

## 2017-07-23 DIAGNOSIS — Z79891 Long term (current) use of opiate analgesic: Secondary | ICD-10-CM | POA: Insufficient documentation

## 2017-07-23 HISTORY — PX: COLONOSCOPY WITH PROPOFOL: SHX5780

## 2017-07-23 SURGERY — COLONOSCOPY WITH PROPOFOL
Anesthesia: General

## 2017-07-23 MED ORDER — SODIUM CHLORIDE 0.9 % IV SOLN
INTRAVENOUS | Status: DC
  Administered 2017-07-23: 08:00:00 via INTRAVENOUS

## 2017-07-23 MED ORDER — LIDOCAINE HCL (PF) 2 % IJ SOLN
INTRAMUSCULAR | Status: AC
  Filled 2017-07-23: qty 10

## 2017-07-23 MED ORDER — PROPOFOL 10 MG/ML IV BOLUS
INTRAVENOUS | Status: DC | PRN
  Administered 2017-07-23: 85 mg via INTRAVENOUS

## 2017-07-23 MED ORDER — LIDOCAINE HCL (PF) 1 % IJ SOLN
2.0000 mL | Freq: Once | INTRAMUSCULAR | Status: AC
Start: 2017-07-23 — End: 2017-07-23
  Administered 2017-07-23: 0.3 mL via INTRADERMAL

## 2017-07-23 MED ORDER — PROPOFOL 500 MG/50ML IV EMUL
INTRAVENOUS | Status: DC | PRN
  Administered 2017-07-23: 125 ug/kg/min via INTRAVENOUS

## 2017-07-23 MED ORDER — LIDOCAINE HCL (PF) 1 % IJ SOLN
INTRAMUSCULAR | Status: AC
  Administered 2017-07-23: 0.3 mL via INTRADERMAL
  Filled 2017-07-23: qty 2

## 2017-07-23 MED ORDER — PHENYLEPHRINE HCL 10 MG/ML IJ SOLN
INTRAMUSCULAR | Status: DC | PRN
  Administered 2017-07-23: 100 ug via INTRAVENOUS

## 2017-07-23 MED ORDER — LIDOCAINE HCL (CARDIAC) PF 100 MG/5ML IV SOSY
PREFILLED_SYRINGE | INTRAVENOUS | Status: DC | PRN
  Administered 2017-07-23: 100 mg via INTRATRACHEAL

## 2017-07-23 NOTE — Op Note (Signed)
[facility] Gastroenterology Patient Name: Angela Bullock Procedure Date: 07/23/2017 8:35 AM MRN: [id number] Account #: [id number] Date of Birth: Sep 21, 1935 Admit Type: Outpatient Age: 82 Room: [facility] ENDO ROOM 1 Gender: Female Note Status: Finalized Procedure:            Colonoscopy Indications:          Diarrhea Providers:            Jonathon Bellows MD, MD Referring MD:         Olin Hauser (Referring MD) Medicines:            Monitored Anesthesia Care Complications:        No immediate complications. Procedure:            Pre-Anesthesia Assessment:                       - Prior to the procedure, a History and Physical was                        performed, and patient medications, allergies and                        sensitivities were reviewed. The patient's tolerance of                        previous anesthesia was reviewed.                       - The risks and benefits of the procedure and the                        sedation options and risks were discussed with the                        patient. All questions were answered and informed                        consent was obtained.                       - ASA Grade Assessment: III - A patient with severe                        systemic disease.                       After obtaining informed consent, the colonoscope was                        passed under direct vision. Throughout the procedure,                        the patient's blood pressure, pulse, and oxygen                        saturations were monitored continuously. The                        Colonoscope was introduced through the anus and                        advanced to the  the cecum, identified by the                        appendiceal orifice, IC valve and transillumination.                        The colonoscopy was performed with ease. The patient                        tolerated the procedure well. The quality of the bowel                preparation was good. Findings:      The perianal and digital rectal examinations were normal.      Multiple small-mouthed diverticula were found in the left colon.      Normal mucosa was found in the entire colon. Biopsies for histology were       taken with a cold forceps for evaluation of microscopic colitis.      The entire examined colon appeared normal on direct and retroflexion       views. Impression:           - Diverticulosis in the left colon.                       - Normal mucosa in the entire examined colon. Biopsied.                       - The entire examined colon is normal on direct and                        retroflexion views. Recommendation:       - Discharge patient to home (with escort).                       - Resume previous diet.                       - Continue present medications.                       - Await pathology results.                       - Return to my office PRN. Procedure Code(s):    --- Professional ---                       [phone], Colonoscopy, flexible; with biopsy, single or                        multiple Diagnosis Code(s):    --- Professional ---                       R19.7, Diarrhea, unspecified                       K57.30, Diverticulosis of large intestine without                        perforation or abscess without bleeding CPT copyright 2017 American Medical Association. All rights reserved. The codes documented in this report are preliminary and upon coder review may  be revised to meet  current compliance requirements. Jonathon Bellows, MD Jonathon Bellows MD, MD 07/23/2017 8:55:13 AM This report has been signed electronically. Number of Addenda: 0 Note Initiated On: 07/23/2017 8:35 AM Scope Withdrawal Time: 0 hours 10 minutes 54 seconds  Total Procedure Duration: 0 hours 14 minutes 10 seconds       [facility]

## 2017-07-23 NOTE — Transfer of Care (Signed)
Immediate Anesthesia Transfer of Care Note  Patient: Angela Bullock  Procedure(s) Performed: COLONOSCOPY WITH PROPOFOL WITH BIOPSIES (N/A )  Patient Location: PACU  Anesthesia Type:General  Level of Consciousness: drowsy  Airway & Oxygen Therapy: Patient Spontanous Breathing and Patient connected to nasal cannula oxygen  Post-op Assessment: Report given to RN, VSS  Post vital signs: Reviewed and stable  Last Vitals:  Vitals Value Taken Time  BP 116/53 07/23/2017  8:58 AM  Temp 36.1 C 07/23/2017  8:58 AM  Pulse 72 07/23/2017  8:59 AM  Resp 21 07/23/2017  8:59 AM  SpO2 97 % 07/23/2017  8:59 AM  Vitals shown include unvalidated device data.  Last Pain:  Vitals:   07/23/17 0858  TempSrc: Tympanic  PainSc:          Complications: No apparent anesthesia complications

## 2017-07-23 NOTE — Anesthesia Preprocedure Evaluation (Signed)
Anesthesia Evaluation  Patient identified by MRN, date of birth, ID band Patient awake    Reviewed: Allergy & Precautions, H&P , NPO status , Patient's Chart, lab work & pertinent test results  History of Anesthesia Complications Negative for: history of anesthetic complications  Airway Mallampati: III  TM Distance: <3 FB Neck ROM: limited    Dental  (+) Chipped, Poor Dentition   Pulmonary neg pulmonary ROS, neg shortness of breath,           Cardiovascular Exercise Tolerance: Good hypertension, (-) angina(-) Past MI and (-) DOE      Neuro/Psych negative neurological ROS  negative psych ROS   GI/Hepatic negative GI ROS, Neg liver ROS,   Endo/Other  negative endocrine ROS  Renal/GU Renal disease  negative genitourinary   Musculoskeletal  (+) Arthritis ,   Abdominal   Peds  Hematology negative hematology ROS (+)   Anesthesia Other Findings Past Medical History: No date: Allergy 01/30/2012: Breast cancer ([place])     Comment:  left, radiation and lumpectomy No date: Hypertension No date: Personal history of radiation therapy  Past Surgical History: No date: ABDOMINAL HYSTERECTOMY 01/30/2011: BREAST BIOPSY; Left     Comment:  negative 01/30/2012: BREAST BIOPSY; Left     Comment:  positive No date: BREAST CYST ASPIRATION; Left 04/02/2012: BREAST LUMPECTOMY; Left     Comment:  positive  BMI    Body Mass Index:  33.30 kg/m      Reproductive/Obstetrics negative OB ROS                             Anesthesia Physical Anesthesia Plan  ASA: III  Anesthesia Plan: General   Post-op Pain Management:    Induction: Intravenous  PONV Risk Score and Plan: Propofol infusion and TIVA  Airway Management Planned: Natural Airway and Nasal Cannula  Additional Equipment:   Intra-op Plan:   Post-operative Plan:   Informed Consent: I have reviewed the patients History and Physical, chart, labs  and discussed the procedure including the risks, benefits and alternatives for the proposed anesthesia with the patient or authorized representative who has indicated his/her understanding and acceptance.   Dental Advisory Given  Plan Discussed with: Anesthesiologist, CRNA and Surgeon  Anesthesia Plan Comments: (Patient consented for risks of anesthesia including but not limited to:  - adverse reactions to medications - risk of intubation if required - damage to teeth, lips or other oral mucosa - sore throat or hoarseness - Damage to heart, brain, lungs or loss of life  Patient voiced understanding.)        Anesthesia Quick Evaluation

## 2017-07-23 NOTE — H&P (Signed)
Jonathon Bellows, MD [address] [place], [place], [place], [address] [address], [place], [place], [place], [address] Phone: [phone]  Fax: [phone]  Primary Care Physician:  Olin Hauser, DO   Pre-Procedure History & Physical: HPI:  Angela Bullock is a 82 y.o. female is here for an colonoscopy.   Past Medical History:  Diagnosis Date  . Allergy   . Breast cancer ([place]) 01/30/2012   left, radiation and lumpectomy  . Hypertension   . Personal history of radiation therapy     Past Surgical History:  Procedure Laterality Date  . ABDOMINAL HYSTERECTOMY    . BREAST BIOPSY Left 01/30/2011   negative  . BREAST BIOPSY Left 01/30/2012   positive  . BREAST CYST ASPIRATION Left   . BREAST LUMPECTOMY Left 04/02/2012   positive    Prior to Admission medications   Medication Sig Start Date End Date Taking? Authorizing Provider  aspirin EC 81 MG tablet Take 1 tablet (81 mg total) by mouth daily. 02/08/16   Karamalegos, Devonne Doughty, DO  atorvastatin (LIPITOR) 10 MG tablet Take 1 tablet (10 mg total) by mouth at bedtime. 02/11/17   Karamalegos, Devonne Doughty, DO  Biotin 10 MG CAPS Take 10 mg by mouth daily.    [provider]  calcium carbonate (OSCAL) 1500 (600 CA) MG TABS tablet Take by mouth 2 (two) times daily with a meal.    [provider]  cholecalciferol (VITAMIN D) 1000 UNITS tablet Take 1,000 Units by mouth daily.    [provider]  co-enzyme Q-10 50 MG capsule Take 50 mg by mouth daily.    [provider]  glucosamine-chondroitin 500-400 MG tablet Take 1 tablet by mouth 3 (three) times daily.    [provider]  ibandronate (BONIVA) 150 MG tablet TAKE 1 TABLET BY MOUTH MONTHLY 12/06/16   Karamalegos, Devonne Doughty, DO  lisinopril (PRINIVIL,ZESTRIL) 10 MG tablet Take 1 tablet (10 mg total) by mouth daily. 02/11/17   Karamalegos, Devonne Doughty, DO  meloxicam (MOBIC) 15 MG tablet TAKE 1 TABLET BY MOUTH DAILY AS NEEDED FOR  PAIN. PREFER TO TAKE UP TO 1 TO 2 WEEKS AT A TIME THEN STOP 04/29/17   Karamalegos, Devonne Doughty, DO  metoprolol tartrate (LOPRESSOR) 25 MG tablet TAKE 1/2 TABLET(12.5 MG) BY MOUTH TWICE DAILY 04/25/17   Karamalegos, Devonne Doughty, DO  Multiple Vitamins-Minerals (CENTRUM SILVER PO) Take 1 tablet by mouth daily.    [provider]  Omega-3 Krill Oil 300 MG CAPS Take 1 capsule by mouth daily.    [provider]  Probiotic Product ([place]) CAPS Take by mouth.    [provider]  traMADol (ULTRAM) 50 MG tablet TAKE 1 TO 2 TABLETS(50 TO 100 MG) BY MOUTH EVERY 8 HOURS AS NEEDED FOR MODERATE PAIN 05/13/17   Olin Hauser, DO    Allergies as of 07/08/2017 - Review Complete 07/04/2017  Allergen Reaction Noted  . Penicillins Rash 10/28/2015    Family History  Problem Relation Age of Onset  . Breast cancer Paternal Aunt 85  . Osteoporosis Mother   . Heart attack Father     Social History   Socioeconomic History  . Marital status: Married    Spouse name: Not on file  . Number of children: Not on file  . Years of education: Not on file  . Highest education level: Not on file  Occupational History  . Not on file  Social Needs  .  Financial resource strain: Not on file  . Food insecurity:    Worry: Not on file    Inability: Not on file  . Transportation needs:    Medical: Not on file    Non-medical: Not on file  Tobacco Use  . Smoking status: Never Smoker  . Smokeless tobacco: Never Used  Substance and Sexual Activity  . Alcohol use: No    Alcohol/week: 0.0 oz  . Drug use: No  . Sexual activity: Yes  Lifestyle  . Physical activity:    Days per week: Not on file    Minutes per session: Not on file  . Stress: Not on file  Relationships  . Social connections:    Talks on phone: Not on file    Gets together: Not on file    Attends religious service: Not on file    Active member of club or organization: Not on file    Attends meetings  of clubs or organizations: Not on file    Relationship status: Not on file  . Intimate partner violence:    Fear of current or ex partner: Not on file    Emotionally abused: Not on file    Physically abused: Not on file    Forced sexual activity: Not on file  Other Topics Concern  . Not on file  Social History Narrative  . Not on file    Review of Systems: See HPI, otherwise negative ROS  Physical Exam: BP (!) 161/86   Pulse 77   Temp (!) 96.9 F (36.1 C)   Resp 20   Ht 5\' 3"  (1.6 m)   Wt 188 lb (85.3 kg)   SpO2 99%   BMI 33.30 kg/m  General:   Alert,  pleasant and cooperative in NAD Head:  Normocephalic and atraumatic. Neck:  Supple; no masses or thyromegaly. Lungs:  Clear throughout to auscultation, normal respiratory effort.    Heart:  +S1, +S2, Regular rate and rhythm, No edema. Abdomen:  Soft, nontender and nondistended. Normal bowel sounds, without guarding, and without rebound.   Neurologic:  Alert and  oriented x4;  grossly normal neurologically.  Impression/Plan: [name] is here for an colonoscopy to be performed for diarrhea .   Risks, benefits, limitations, and alternatives regarding  colonoscopy have been reviewed with the patient.  Questions have been answered.  All parties agreeable.   Jonathon Bellows, MD  07/23/2017, 8:30 AM

## 2017-07-23 NOTE — Anesthesia Postprocedure Evaluation (Signed)
Anesthesia Post Note  Patient: Miniya E Simington  Procedure(s) Performed: COLONOSCOPY WITH PROPOFOL WITH BIOPSIES (N/A )  Patient location during evaluation: Endoscopy Anesthesia Type: General Level of consciousness: awake and alert Pain management: pain level controlled Vital Signs Assessment: post-procedure vital signs reviewed and stable Respiratory status: spontaneous breathing, nonlabored ventilation, respiratory function stable and patient connected to nasal cannula oxygen Cardiovascular status: blood pressure returned to baseline and stable Postop Assessment: no apparent nausea or vomiting Anesthetic complications: no     Last Vitals:  Vitals:   07/23/17 0858 07/23/17 0917  BP: 117/85 (!) 153/67  Pulse: 73   Resp: 19   Temp: (!) 36.1 C   SpO2: 99%     Last Pain:  Vitals:   07/23/17 0917  TempSrc:   PainSc: 0-No pain                 Precious Haws Ronrico Dupin

## 2017-07-23 NOTE — Anesthesia Post-op Follow-up Note (Signed)
Anesthesia QCDR form completed.        

## 2017-07-24 ENCOUNTER — Other Ambulatory Visit: Payer: Self-pay | Admitting: Family Medicine

## 2017-07-24 DIAGNOSIS — M25562 Pain in left knee: Principal | ICD-10-CM

## 2017-07-24 DIAGNOSIS — M25561 Pain in right knee: Principal | ICD-10-CM

## 2017-07-24 DIAGNOSIS — M150 Primary generalized (osteo)arthritis: Secondary | ICD-10-CM

## 2017-07-24 DIAGNOSIS — G8929 Other chronic pain: Secondary | ICD-10-CM

## 2017-07-24 DIAGNOSIS — M159 Polyosteoarthritis, unspecified: Secondary | ICD-10-CM

## 2017-07-24 LAB — SURGICAL PATHOLOGY

## 2017-07-25 ENCOUNTER — Encounter: Payer: Self-pay | Admitting: Gastroenterology

## 2017-07-26 ENCOUNTER — Encounter: Payer: Self-pay | Admitting: Gastroenterology

## 2017-08-13 ENCOUNTER — Ambulatory Visit (INDEPENDENT_AMBULATORY_CARE_PROVIDER_SITE_OTHER): Payer: Medicare Other

## 2017-08-13 VITALS — BP 130/72 | HR 68 | Temp 97.7°F | Resp 17 | Ht 63.0 in | Wt 192.2 lb

## 2017-08-13 DIAGNOSIS — Z0000 Encounter for general adult medical examination without abnormal findings: Secondary | ICD-10-CM | POA: Diagnosis not present

## 2017-08-13 NOTE — Patient Instructions (Addendum)
Angela Bullock , Thank you for taking time to come for your Medicare Wellness Visit. I appreciate your ongoing commitment to your health goals. Please review the following plan we discussed and let me know if I can assist you in the future.   Screening recommendations/referrals: Colonoscopy: completed 07/23/2017 Mammogram: completed 06/10/2017 Bone Density: completed 10/17/2016 Recommended yearly ophthalmology/optometry visit for glaucoma screening and checkup Recommended yearly dental visit for hygiene and checkup  Vaccinations: Influenza vaccine: due 09/2017 Pneumococcal vaccine: completed series Tdap vaccine: up to date Shingles vaccine: shingrix eligible, check with your insurance company for coverage    Advanced directives: Please bring a copy of your health care power of attorney and living will to the office at your convenience.  Conditions/risks identified: Recommend drinking at least 6-8 glasses of water a day   Next appointment: Follow up on 09/11/2017 at 10:40am with Dr.Karamalegos. Follow up in one year for your annual wellness exam.    Preventive Care 65 Years and Older, Female Preventive care refers to lifestyle choices and visits with your health care provider that can promote health and wellness. What does preventive care include?  A yearly physical exam. This is also called an annual well check.  Dental exams once or twice a year.  Routine eye exams. Ask your health care provider how often you should have your eyes checked.  Personal lifestyle choices, including:  Daily care of your teeth and gums.  Regular physical activity.  Eating a healthy diet.  Avoiding tobacco and drug use.  Limiting alcohol use.  Practicing safe sex.  Taking low-dose aspirin every day.  Taking vitamin and mineral supplements as recommended by your health care provider. What happens during an annual well check? The services and screenings done by your health care provider during your  annual well check will depend on your age, overall health, lifestyle risk factors, and family history of disease. Counseling  Your health care provider may ask you questions about your:  Alcohol use.  Tobacco use.  Drug use.  Emotional well-being.  Home and relationship well-being.  Sexual activity.  Eating habits.  History of falls.  Memory and ability to understand (cognition).  Work and work Statistician.  Reproductive health. Screening  You may have the following tests or measurements:  Height, weight, and BMI.  Blood pressure.  Lipid and cholesterol levels. These may be checked every 5 years, or more frequently if you are over 50 years old.  Skin check.  Lung cancer screening. You may have this screening every year starting at age 98 if you have a 30-pack-year history of smoking and currently smoke or have quit within the past 15 years.  Fecal occult blood test (FOBT) of the stool. You may have this test every year starting at age 82.  Flexible sigmoidoscopy or colonoscopy. You may have a sigmoidoscopy every 5 years or a colonoscopy every 10 years starting at age 8.  Hepatitis C blood test.  Hepatitis B blood test.  Sexually transmitted disease (STD) testing.  Diabetes screening. This is done by checking your blood sugar (glucose) after you have not eaten for a while (fasting). You may have this done every 1-3 years.  Bone density scan. This is done to screen for osteoporosis. You may have this done starting at age 29.  Mammogram. This may be done every 1-2 years. Talk to your health care provider about how often you should have regular mammograms. Talk with your health care provider about your test results, treatment options, and  if necessary, the need for more tests. Vaccines  Your health care provider may recommend certain vaccines, such as:  Influenza vaccine. This is recommended every year.  Tetanus, diphtheria, and acellular pertussis (Tdap, Td)  vaccine. You may need a Td booster every 10 years.  Zoster vaccine. You may need this after age 32.  Pneumococcal 13-valent conjugate (PCV13) vaccine. One dose is recommended after age 78.  Pneumococcal polysaccharide (PPSV23) vaccine. One dose is recommended after age 1. Talk to your health care provider about which screenings and vaccines you need and how often you need them. This information is not intended to replace advice given to you by your health care provider. Make sure you discuss any questions you have with your health care provider. Document Released: 02/11/2015 Document Revised: 10/05/2015 Document Reviewed: 11/16/2014 Elsevier Interactive Patient Education  2017 [place] Prevention in the Home Falls can cause injuries. They can happen to people of all ages. There are many things you can do to make your home safe and to help prevent falls. What can I do on the outside of my home?  Regularly fix the edges of walkways and driveways and fix any cracks.  Remove anything that might make you trip as you walk through a door, such as a raised step or threshold.  Trim any bushes or trees on the path to your home.  Use bright outdoor lighting.  Clear any walking paths of anything that might make someone trip, such as rocks or tools.  Regularly check to see if handrails are loose or broken. Make sure that both sides of any steps have handrails.  Any raised decks and porches should have guardrails on the edges.  Have any leaves, snow, or ice cleared regularly.  Use sand or salt on walking paths during winter.  Clean up any spills in your garage right away. This includes oil or grease spills. What can I do in the bathroom?  Use night lights.  Install grab bars by the toilet and in the tub and shower. Do not use towel bars as grab bars.  Use non-skid mats or decals in the tub or shower.  If you need to sit down in the shower, use a plastic, non-slip  stool.  Keep the floor dry. Clean up any water that spills on the floor as soon as it happens.  Remove soap buildup in the tub or shower regularly.  Attach bath mats securely with double-sided non-slip rug tape.  Do not have throw rugs and other things on the floor that can make you trip. What can I do in the bedroom?  Use night lights.  Make sure that you have a light by your bed that is easy to reach.  Do not use any sheets or blankets that are too big for your bed. They should not hang down onto the floor.  Have a firm chair that has side arms. You can use this for support while you get dressed.  Do not have throw rugs and other things on the floor that can make you trip. What can I do in the kitchen?  Clean up any spills right away.  Avoid walking on wet floors.  Keep items that you use a lot in easy-to-reach places.  If you need to reach something above you, use a strong step stool that has a grab bar.  Keep electrical cords out of the way.  Do not use floor polish or wax that makes floors slippery. If you  must use wax, use non-skid floor wax.  Do not have throw rugs and other things on the floor that can make you trip. What can I do with my stairs?  Do not leave any items on the stairs.  Make sure that there are handrails on both sides of the stairs and use them. Fix handrails that are broken or loose. Make sure that handrails are as long as the stairways.  Check any carpeting to make sure that it is firmly attached to the stairs. Fix any carpet that is loose or worn.  Avoid having throw rugs at the top or bottom of the stairs. If you do have throw rugs, attach them to the floor with carpet tape.  Make sure that you have a light switch at the top of the stairs and the bottom of the stairs. If you do not have them, ask someone to add them for you. What else can I do to help prevent falls?  Wear shoes that:  Do not have high heels.  Have rubber bottoms.  Are  comfortable and fit you well.  Are closed at the toe. Do not wear sandals.  If you use a stepladder:  Make sure that it is fully opened. Do not climb a closed stepladder.  Make sure that both sides of the stepladder are locked into place.  Ask someone to hold it for you, if possible.  Clearly mark and make sure that you can see:  Any grab bars or handrails.  First and last steps.  Where the edge of each step is.  Use tools that help you move around (mobility aids) if they are needed. These include:  Canes.  Walkers.  Scooters.  Crutches.  Turn on the lights when you go into a dark area. Replace any light bulbs as soon as they burn out.  Set up your furniture so you have a clear path. Avoid moving your furniture around.  If any of your floors are uneven, fix them.  If there are any pets around you, be aware of where they are.  Review your medicines with your doctor. Some medicines can make you feel dizzy. This can increase your chance of falling. Ask your doctor what other things that you can do to help prevent falls. This information is not intended to replace advice given to you by your health care provider. Make sure you discuss any questions you have with your health care provider. Document Released: 11/11/2008 Document Revised: 06/23/2015 Document Reviewed: 02/19/2014 Elsevier Interactive Patient Education  2017 [employer].

## 2017-08-13 NOTE — Progress Notes (Signed)
Subjective:   Angela Bullock is a 82 y.o. female who presents for Medicare Annual (Subsequent) preventive examination.  Review of Systems:   Cardiac Risk Factors include: advanced age (>73men, >35 women);dyslipidemia;hypertension;obesity (BMI >30kg/m2)     Objective:     Vitals: BP 130/72 (BP Location: Right Arm, Patient Position: Sitting)   Pulse 68   Temp 97.7 F (36.5 C) (Oral)   Resp 17   Ht 5\' 3"  (1.6 m)   Wt 192 lb 3.2 oz (87.2 kg)   BMI 34.05 kg/m   Body mass index is 34.05 kg/m.  Advanced Directives 08/13/2017 07/23/2017 05/30/2017 07/31/2016 05/08/2016 10/28/2015 04/28/2015  Does Patient Have a Medical Advance Directive? Yes Yes Yes Yes Yes No Yes  Type of Paramedic of [place];Living will [place];Living will Living will;Healthcare Power of [place];Living will - - [place];Living will  Does patient want to make changes to medical advance directive? - - - - - - No - Patient declined  Copy of [place] in Chart? No - copy requested No - copy requested - No - copy requested - - No - copy requested  Would patient like information on creating a medical advance directive? - - - - - - No - patient declined information    Tobacco Social History   Tobacco Use  Smoking Status Never Smoker  Smokeless Tobacco Never Used     Counseling given: Not Answered   Clinical Intake:  Pre-visit preparation completed: Yes  Pain : No/denies pain Pain Score: 0-No pain     Nutritional Status: BMI > 30  Obese Nutritional Risks: None Diabetes: No  How often do you need to have someone help you when you read instructions, pamphlets, or other written materials from your doctor or pharmacy?: 1 - Never What is the last grade level you completed in school?: high school, some college   Interpreter Needed?: No  Information entered by :: Anelly Samarin,LPN   Past Medical History:   Diagnosis Date  . Allergy   . Breast cancer ([place]) 01/30/2012   left, radiation and lumpectomy  . Hypertension   . Personal history of radiation therapy    Past Surgical History:  Procedure Laterality Date  . ABDOMINAL HYSTERECTOMY    . BREAST BIOPSY Left 01/30/2011   negative  . BREAST BIOPSY Left 01/30/2012   positive  . BREAST CYST ASPIRATION Left   . BREAST LUMPECTOMY Left 04/02/2012   positive  . COLONOSCOPY WITH PROPOFOL N/A 07/23/2017   Procedure: COLONOSCOPY WITH PROPOFOL WITH BIOPSIES;  Surgeon: Jonathon Bellows, MD;  [facility];  Service: Gastroenterology;  Laterality: N/A;   Family History  Problem Relation Age of Onset  . Breast cancer Paternal Aunt 85  . Osteoporosis Mother   . Heart attack Father    Social History   Socioeconomic History  . Marital status: Married    Spouse name: Not on file  . Number of children: Not on file  . Years of education: 19  . Highest education level: High school graduate  Occupational History  . Not on file  Social Needs  . Financial resource strain: Not hard at all  . Food insecurity:    Worry: Never true    Inability: Never true  . Transportation needs:    Medical: No    Non-medical: No  Tobacco Use  . Smoking status: Never Smoker  . Smokeless tobacco: Never Used  Substance and  Sexual Activity  . Alcohol use: No    Alcohol/week: 0.0 oz  . Drug use: No  . Sexual activity: Yes  Lifestyle  . Physical activity:    Days per week: 0 days    Minutes per session: 0 min  . Stress: Not at all  Relationships  . Social connections:    Talks on phone: More than three times a week    Gets together: More than three times a week    Attends religious service: Never    Active member of club or organization: Yes    Attends meetings of clubs or organizations: More than 4 times per year    Relationship status: Married  Other Topics Concern  . Not on file  Social History Narrative  . Not on file    Outpatient Encounter  Medications as of 08/13/2017  Medication Sig  . atorvastatin (LIPITOR) 10 MG tablet Take 1 tablet (10 mg total) by mouth at bedtime.  . Biotin 10 MG CAPS Take 10 mg by mouth daily.  . calcium carbonate (OSCAL) 1500 (600 CA) MG TABS tablet Take by mouth 2 (two) times daily with a meal.  . cholecalciferol (VITAMIN D) 1000 UNITS tablet Take 1,000 Units by mouth daily.  Marland Kitchen co-enzyme Q-10 50 MG capsule Take 50 mg by mouth daily.  Marland Kitchen glucosamine-chondroitin 500-400 MG tablet Take 1 tablet by mouth 3 (three) times daily.  Marland Kitchen lisinopril (PRINIVIL,ZESTRIL) 10 MG tablet Take 1 tablet (10 mg total) by mouth daily.  . meloxicam (MOBIC) 15 MG tablet TAKE 1 TABLET BY MOUTH DAILY AS NEEDED FOR PAIN. PREFER TO TAKE UP TO 1 TO 2 WEEKS AT A TIME THEN STOP  . metoprolol tartrate (LOPRESSOR) 25 MG tablet TAKE 1/2 TABLET(12.5 MG) BY MOUTH TWICE DAILY  . Multiple Vitamins-Minerals (CENTRUM SILVER PO) Take 1 tablet by mouth daily.  . Omega-3 Krill Oil 300 MG CAPS Take 1 capsule by mouth daily.  . traMADol (ULTRAM) 50 MG tablet TAKE 1 TO 2 TABLETS(50 TO 100 MG) BY MOUTH EVERY 8 HOURS AS NEEDED FOR MODERATE PAIN  . aspirin EC 81 MG tablet Take 1 tablet (81 mg total) by mouth daily. (Patient not taking: Reported on 08/13/2017)  . ibandronate (BONIVA) 150 MG tablet TAKE 1 TABLET BY MOUTH MONTHLY (Patient not taking: Reported on 08/13/2017)  . Probiotic Product ([place]) CAPS Take by mouth.   No facility-administered encounter medications on file as of 08/13/2017.     Activities of Daily Living In your present state of health, do you have any difficulty performing the following activities: 08/13/2017  Hearing? N  Vision? N  Difficulty concentrating or making decisions? Y  Walking or climbing stairs? N  Dressing or bathing? N  Doing errands, shopping? N  Preparing Food and eating ? N  Using the Toilet? N  In the past six months, have you accidently leaked urine? N  Do you have problems with loss of bowel  control? N  Managing your Medications? N  Managing your Finances? N  Housekeeping or managing your Housekeeping? N  Some recent data might be hidden    Patient Care Team: Olin Hauser, DO as PCP - General (Family Medicine) Lequita Asal, MD as Referring Physician (Hematology and Oncology)    Assessment:   This is a routine wellness examination for Terrilee.  Exercise Activities and Dietary recommendations Current Exercise Habits: The patient does not participate in regular exercise at present, Exercise limited by: None identified  Goals    .  DIET - INCREASE WATER INTAKE     Recommend drinking at least 6-8 glasses of water a day        Fall Risk Fall Risk  08/13/2017 02/11/2017 07/31/2016 11/08/2015  Falls in the past year? No No Yes No  Number falls in past yr: - - 1 -  Injury with Fall? - - No -   Is the patient's home free of loose throw rugs in walkways, pet beds, electrical cords, etc?   yes      Grab bars in the bathroom? yes      Handrails on the stairs?   yes      Adequate lighting?   yes  Timed Get Up and Go performed: Completed in 8 seconds with no use of assistive devices, steady gait. No intervention needed at this time.   Depression Screen PHQ 2/9 Scores 08/13/2017 05/09/2017 02/11/2017 07/31/2016  PHQ - 2 Score 0 2 0 0  PHQ- 9 Score - 7 - -     Cognitive Function     6CIT Screen 08/13/2017 07/31/2016  What Year? 0 points 0 points  What month? 0 points 0 points  What time? 0 points 0 points  Count back from 20 0 points 0 points  Months in reverse 0 points 0 points  Repeat phrase 0 points 0 points  Total Score 0 0    Immunization History  Administered Date(s) Administered  . Influenza, High Dose Seasonal PF 10/22/2014  . Influenza, Seasonal, Injecte, Preservative Fre 10/24/2012  . Influenza-Unspecified 10/24/2015  . Pneumococcal Conjugate-13 01/14/2013  . Tdap 05/21/2013  . Zoster 01/30/2012    Qualifies for Shingles Vaccine? Yes,  discussed shingrix vaccine  Screening Tests Health Maintenance  Topic Date Due  . INFLUENZA VACCINE  08/29/2017  . TETANUS/TDAP  05/22/2023  . DEXA SCAN  Completed  . PNA vac Low Risk Adult  Completed    Cancer Screenings: Lung: Low Dose CT Chest recommended if Age 78-80 years, 30 pack-year currently smoking OR have quit w/in 15years. Patient does not qualify. Breast:  Up to date on Mammogram? Yes  06/10/2017 Up to date of Bone Density/Dexa? Yes 10/17/2016 Colorectal: completed 07/23/2017  Additional Screenings:  Hepatitis C Screening: not indicated      Plan:    I have personally reviewed and addressed the Medicare Annual Wellness questionnaire and have noted the following in the patient's chart:  A. Medical and social history B. Use of alcohol, tobacco or illicit drugs  C. Current medications and supplements D. Functional ability and status E.  Nutritional status F.  Physical activity G. Advance directives H. List of other physicians I.  Hospitalizations, surgeries, and ER visits in previous 12 months J.  [place] such as hearing and vision if needed, cognitive and depression L. Referrals and appointments   In addition, I have reviewed and discussed with patient certain preventive protocols, quality metrics, and best practice recommendations. A written personalized care plan for preventive services as well as general preventive health recommendations were provided to patient.   Signed,  Tyler Aas, LPN Nurse Health Advisor   Nurse Notes:none

## 2017-08-26 ENCOUNTER — Other Ambulatory Visit: Payer: Self-pay | Admitting: Family Medicine

## 2017-08-26 DIAGNOSIS — M25562 Pain in left knee: Principal | ICD-10-CM

## 2017-08-26 DIAGNOSIS — G8929 Other chronic pain: Secondary | ICD-10-CM

## 2017-08-26 DIAGNOSIS — M159 Polyosteoarthritis, unspecified: Secondary | ICD-10-CM

## 2017-08-26 DIAGNOSIS — M150 Primary generalized (osteo)arthritis: Secondary | ICD-10-CM

## 2017-08-26 DIAGNOSIS — M25561 Pain in right knee: Principal | ICD-10-CM

## 2017-09-11 ENCOUNTER — Other Ambulatory Visit: Payer: Self-pay | Admitting: Family Medicine

## 2017-09-11 ENCOUNTER — Ambulatory Visit (INDEPENDENT_AMBULATORY_CARE_PROVIDER_SITE_OTHER): Payer: Medicare Other | Admitting: Family Medicine

## 2017-09-11 ENCOUNTER — Encounter: Payer: Self-pay | Admitting: Family Medicine

## 2017-09-11 VITALS — BP 120/60 | HR 52 | Temp 98.7°F | Resp 16 | Ht 63.0 in | Wt 187.0 lb

## 2017-09-11 DIAGNOSIS — G8929 Other chronic pain: Secondary | ICD-10-CM | POA: Diagnosis not present

## 2017-09-11 DIAGNOSIS — M545 Low back pain, unspecified: Secondary | ICD-10-CM

## 2017-09-11 DIAGNOSIS — E785 Hyperlipidemia, unspecified: Secondary | ICD-10-CM

## 2017-09-11 DIAGNOSIS — M25561 Pain in right knee: Secondary | ICD-10-CM

## 2017-09-11 DIAGNOSIS — R7309 Other abnormal glucose: Secondary | ICD-10-CM

## 2017-09-11 DIAGNOSIS — M150 Primary generalized (osteo)arthritis: Secondary | ICD-10-CM

## 2017-09-11 DIAGNOSIS — R011 Cardiac murmur, unspecified: Secondary | ICD-10-CM | POA: Diagnosis not present

## 2017-09-11 DIAGNOSIS — M159 Polyosteoarthritis, unspecified: Secondary | ICD-10-CM

## 2017-09-11 DIAGNOSIS — I10 Essential (primary) hypertension: Secondary | ICD-10-CM | POA: Diagnosis not present

## 2017-09-11 DIAGNOSIS — M25562 Pain in left knee: Secondary | ICD-10-CM

## 2017-09-11 DIAGNOSIS — M85851 Other specified disorders of bone density and structure, right thigh: Secondary | ICD-10-CM

## 2017-09-11 DIAGNOSIS — N183 Chronic kidney disease, stage 3 unspecified: Secondary | ICD-10-CM

## 2017-09-11 DIAGNOSIS — E559 Vitamin D deficiency, unspecified: Secondary | ICD-10-CM

## 2017-09-11 MED ORDER — MELOXICAM 15 MG PO TABS: 15.0000 mg | ORAL_TABLET | Freq: Every day | ORAL | 3 refills | Status: DC | PRN

## 2017-09-11 MED ORDER — TRAMADOL HCL 50 MG PO TABS: 50.0000 mg | ORAL_TABLET | Freq: Three times a day (TID) | ORAL | 1 refills | Status: DC | PRN

## 2017-09-11 NOTE — Assessment & Plan Note (Signed)
Improved on NSAID and Tramadol PRN Chronic problem with primary concern of knees and back. Currently without flare but prior flares of bilateral knee pain with OA/DJD - No prior history of knee surgery, arthroscopy. Never established with Ortho - No recent imaging OFF Diclofenac topical  Plan: 1. Continue current plan with Meloxicam 15mg  intermittent 1-2 weeks at a time PRN - caution with CKD-III - Refill Tramadol 50mg  PRN breakthrough pain #30 +1 refill given may take more frequently if need since tolerating well and effective, very rarely using now. Future depending on use can sign [facility] Controlled Contract - May take Tylenol PRN - RICE therapy (rest, ice, compression, elevation) for swelling, activity modification - Defer Knee x-rays for now can check in future - again reviewed this, I do recommend imaging knees/lumbar - therwise ortho would image if she was inclined for referral  Follow-up as planned q 6 months  May refer to Ortho in future when/if ready

## 2017-09-11 NOTE — Progress Notes (Signed)
Subjective:    Patient ID: Angela Bullock, female    DOB: 1936/01/07, 82 y.o.   MRN: [id number]  Angela Bullock is a 82 y.o. female presenting on 09/11/2017 for Hypertension   HPI   FOLLOW-UP Chronic Pain Multiple Joints / Osteoarthritis Multiple joints (knees, back) Last visit 01/2017, see note for background information, she was given new rx Tramadol. - Interval update - she is doing well on tramadol only taking very rarely and sparingly taking 1 pill every 1-3 weeks PRN with good results. - Today doing well currently still has regular knee pain, episodes, some days better, improved on medicine. - Taking Tramadol rarely as above - has few pills left from original rx #30 pills in 01/2017 - Taking Meloxicam 15mg  daily for 1-2 weeks at a time, in few week intervals, doing better while on it - Taking Tylenol PRN - Not established with Orthopedics, she was considering future knee injections. Wants to avoid surgery Denies new injury fall or worsening joint swelling or redness or problem  FOLLOW-UP Diarrhea / Change in Bowel Habits - Since last visit, referred to [place] 06/2017, had colonoscopy unremarkable negative, some diverticulosis, advised to stop probiotic. No new concerns.  CHRONIC HTN / CKD-III Reportshome BP checks stable, remain well. Current Meds -Lisinopril 10mg  daily, Metoprolol 12.5mg  BID(half of 25mg  - and often takes WHOLE pill for convenience) Reports good compliance, took meds today. Tolerating well, w/o complaints. Last lab showed stable elevated Creatinine.  Heart Murmur Chronic problem, has not had any new changes or concerns or new symptoms. She saw heart doctor years ago, no recent ECHO on file. She has been on metoprolol for years now, without known cardiac diagnosis.  Health Maintenance: Updated colonoscopy recently - see above Due for Flu Vaccine - will return. UTD DEXA, dx Osteopenia on [facility] UTD Mammogram surveillance per Oncology  Depression screen [facility]  2/9 09/11/2017 08/13/2017 05/09/2017  Decreased Interest 0 0 1  Down, Depressed, Hopeless 0 0 1  PHQ - 2 Score 0 0 2  Altered sleeping - - 0  Tired, decreased energy - - 3  Change in appetite - - 2  Feeling bad or failure about yourself  - - 0  Trouble concentrating - - 0  Moving slowly or fidgety/restless - - 0  Suicidal thoughts - - 0  PHQ-9 Score - - 7  Difficult doing work/chores - - Not difficult at all    Social History   Tobacco Use  . Smoking status: Never Smoker  . Smokeless tobacco: Never Used  Substance Use Topics  . Alcohol use: No    Alcohol/week: 0.0 standard drinks  . Drug use: No    Review of Systems Per HPI unless specifically indicated above     Objective:    BP 120/60   Pulse (!) 52   Temp 98.7 F (37.1 C) (Oral)   Resp 16   Ht 5\' 3"  (1.6 m)   Wt 187 lb (84.8 kg)   BMI 33.13 kg/m   Wt Readings from Last 3 Encounters:  09/11/17 187 lb (84.8 kg)  08/13/17 192 lb 3.2 oz (87.2 kg)  07/23/17 188 lb (85.3 kg)    Physical Exam  Constitutional: She is oriented to person, place, and time. She appears well-developed and well-nourished. No distress.  Well-appearing, comfortable, cooperative  HENT:  Head: Normocephalic and atraumatic.  Mouth/Throat: Oropharynx is clear and moist.  Eyes: Conjunctivae are normal. Right eye exhibits no discharge. Left eye exhibits no discharge.  Neck:  Normal range of motion. Neck supple. No thyromegaly present.  Cardiovascular: Normal rate, regular rhythm, normal heart sounds and intact distal pulses.  No murmur heard. Pulmonary/Chest: Effort normal and breath sounds normal. No respiratory distress. She has no wheezes. She has no rales.  Musculoskeletal: Normal range of motion. She exhibits no edema.  Lymphadenopathy:    She has no cervical adenopathy.  Neurological: She is alert and oriented to person, place, and time.  Skin: Skin is warm and dry. No rash noted. She is not diaphoretic. No erythema.  Psychiatric: She has  a normal mood and affect. Her behavior is normal.  Well groomed, good eye contact, normal speech and thoughts  Nursing note and vitals reviewed.  Results for orders placed or performed during the hospital encounter of 07/23/17  Surgical pathology  Result Value Ref Range   SURGICAL PATHOLOGY      Surgical Pathology CASE: [id number] PATIENT: Angela Bullock Surgical Pathology Report     SPECIMEN SUBMITTED: A. Colon, random, r/o microscopic colitis; cbx  CLINICAL HISTORY: None provided  PRE-OPERATIVE DIAGNOSIS: Diarrhea, R19.7  POST-OPERATIVE DIAGNOSIS: Diverticulosis     DIAGNOSIS: A.  RANDOM COLON; COLD BIOPSY: - COLONIC MUCOSA NEGATIVE FOR MICROSCOPIC COLITIS, DYSPLASIA AND MALIGNANCY.  GROSS DESCRIPTION: 1 A. Labeled: Random cbx colon rule out microscopic colitis Received: In formalin Tissue fragment(s): Multiple Size: Aggregate, 1.1 x 0.3 x 0.1 cm Description: Pink-yellow fragments Entirely submitted in one cassette.     Final Diagnosis performed by Delorse Lek, MD.   Electronically signed 07/24/2017 4:45:08PM The electronic signature indicates that the named Attending Pathologist has evaluated the specimen  Technical component performed at [facility], [address] [place], [place] [address] Lab: [phone] Dir: Rush Farmer, MD, MMM   Professional component performed at [facility], [facility], [place], [place], [place] [address] Lab: [phone] Dir: Dellia Nims. Rubinas, MD       Assessment & Plan:   Problem List Items Addressed This Visit    Bilateral chronic knee pain    See A&P for osteoarthritis      Relevant Medications   traMADol (ULTRAM) 50 MG tablet   meloxicam (MOBIC) 15 MG tablet   Chronic low back pain without sciatica    See A&P for osteoarthritis      Relevant Medications   traMADol (ULTRAM) 50 MG tablet   meloxicam (MOBIC) 15 MG tablet   Heart murmur    Stable chronic systolic heart  murmur Unchanged Asymptomatic Offered ECHOcardiogram given heart murmur and prior history determine if need BB for other reason, ultimately we agree to defer for now, she is asymptomatic.      Hypertension - Primary    Well-controlled HTN, mild lower DBP. Also concern lower pulse today compared to prior - Taking higher dose Metoprolol for whole pill 25mg  BID due to convenience - Home BP readings normal  Complication with CKD-III No known CAD or cardiac etiology that would warrant beta blocker, seems only taking for anti HTN effect   Plan:  1. STOP Metoprolol 12.5 to 25mg  BID for now - do a trial off to see if actually needs - May continue Lisinopril 10mg  daily for now - if elevated BP on home monitor off BB then can double dose Lisinopril to 20mg  daily notify office if need 2. Encourage improved lifestyle - low sodium diet, regular exercise 3. Continue monitor BP outside office, bring readings to next visit, if persistently >140/90 or new symptoms notify office sooner 4. Follow-up 6 months - for yearly  check, may adjust BP meds more at that time.  Offered ECHOcardiogram given heart murmur and prior history determine if need BB for other reason, ultimately we agree to defer for now, she is asymptomatic.       Osteoarthritis, multiple sites    Improved on NSAID and Tramadol PRN Chronic problem with primary concern of knees and back. Currently without flare but prior flares of bilateral knee pain with OA/DJD - No prior history of knee surgery, arthroscopy. Never established with Ortho - No recent imaging OFF Diclofenac topical  Plan: 1. Continue current plan with Meloxicam 15mg  intermittent 1-2 weeks at a time PRN - caution with CKD-III - Refill Tramadol 50mg  PRN breakthrough pain #30 +1 refill given may take more frequently if need since tolerating well and effective, very rarely using now. Future depending on use can sign [facility] Controlled Contract - May take Tylenol PRN - Bullock  therapy (rest, ice, compression, elevation) for swelling, activity modification - Defer Knee x-rays for now can check in future - again reviewed this, I do recommend imaging knees/lumbar - therwise ortho would image if she was inclined for referral  Follow-up as planned q 6 months  May refer to Ortho in future when/if ready      Relevant Medications   traMADol (ULTRAM) 50 MG tablet   meloxicam (MOBIC) 15 MG tablet      Meds ordered this encounter  Medications  . traMADol (ULTRAM) 50 MG tablet    Sig: Take 1 tablet (50 mg total) by mouth every 8 (eight) hours as needed for moderate pain or severe pain.    Dispense:  30 tablet    Refill:  1  . meloxicam (MOBIC) 15 MG tablet    Sig: Take 1 tablet (15 mg total) by mouth daily as needed for pain. May take in 1-2 week intervals as recommended    Dispense:  30 tablet    Refill:  3    Follow up plan: Return in about 6 months (around 03/14/2018) for Yearly Medicare Checkup.  Future labs ordered for 03/10/18  Nobie Putnam, [facility] 09/11/2017, 12:58 PM

## 2017-09-11 NOTE — Assessment & Plan Note (Signed)
See A&P for osteoarthritis.

## 2017-09-11 NOTE — Assessment & Plan Note (Signed)
Well-controlled HTN, mild lower DBP. Also concern lower pulse today compared to prior - Taking higher dose Metoprolol for whole pill 25mg  BID due to convenience - Home BP readings normal  Complication with CKD-III No known CAD or cardiac etiology that would warrant beta blocker, seems only taking for anti HTN effect   Plan:  1. STOP Metoprolol 12.5 to 25mg  BID for now - do a trial off to see if actually needs - May continue Lisinopril 10mg  daily for now - if elevated BP on home monitor off BB then can double dose Lisinopril to 20mg  daily notify office if need 2. Encourage improved lifestyle - low sodium diet, regular exercise 3. Continue monitor BP outside office, bring readings to next visit, if persistently >140/90 or new symptoms notify office sooner 4. Follow-up 6 months - for yearly check, may adjust BP meds more at that time.  Offered ECHOcardiogram given heart murmur and prior history determine if need BB for other reason, ultimately we agree to defer for now, she is asymptomatic.

## 2017-09-11 NOTE — Assessment & Plan Note (Addendum)
Stable chronic systolic heart murmur Unchanged Asymptomatic Offered ECHOcardiogram given heart murmur and prior history determine if need BB for other reason, ultimately we agree to defer for now, she is asymptomatic.

## 2017-09-11 NOTE — Patient Instructions (Addendum)
Thank you for coming to the office today.  BP is very good today, pulse is lower than recommended. May not need Metoprolol anymore. As discussed, no other known heart condition.  - STOP Metoprolol 25mg  twice daily for now - Monitor BP - if normal then continue Lisinopril 10mg  daily, if elevated >140/90 consistently then can double Lisinopril to 20mg  daily (x 2 of the 10s) notify us about this  - in future if need we can always restart metoprolol or check an ECHOcardiogram for heart function if needed - you do have heart murmur still, but no concerning symptoms - if chest pain short of breath, passing out, lightheaded dizzy or new concerns.  Refilled Tramadol - may use as needed, can use slightly more often if helping. - Continue on Meloxicam as you are.  DUE for FASTING BLOOD WORK (no food or drink after midnight before the lab appointment, only water or coffee without cream/sugar on the morning of)  SCHEDULE "Lab Only" visit in the morning at the clinic for lab draw in 6 MONTHS   - Make sure Lab Only appointment is at about 1 week before your next appointment, so that results will be available  For Lab Results, once available within 2-3 days of blood draw, you can can log in to MyChart online to view your results and a brief explanation. Also, we can discuss results at next follow-up visit.  Please schedule a Follow-up Appointment to: Return in about 6 months (around 03/14/2018) for Yearly Medicare Checkup.  If you have any other questions or concerns, please feel free to call the office or send a message through [place]. You may also schedule an earlier appointment if necessary.  Additionally, you may be receiving a survey about your experience at our office within a few days to 1 week by e-mail or mail. We value your feedback.  Nobie Putnam, DO [place]

## 2017-09-22 ENCOUNTER — Other Ambulatory Visit: Payer: Self-pay | Admitting: Family Medicine

## 2017-09-22 DIAGNOSIS — E785 Hyperlipidemia, unspecified: Secondary | ICD-10-CM

## 2017-10-02 ENCOUNTER — Other Ambulatory Visit: Payer: Self-pay | Admitting: Family Medicine

## 2017-10-02 DIAGNOSIS — M159 Polyosteoarthritis, unspecified: Secondary | ICD-10-CM

## 2017-10-02 DIAGNOSIS — G8929 Other chronic pain: Secondary | ICD-10-CM

## 2017-10-02 DIAGNOSIS — M25561 Pain in right knee: Principal | ICD-10-CM

## 2017-10-02 DIAGNOSIS — M150 Primary generalized (osteo)arthritis: Secondary | ICD-10-CM

## 2017-10-02 DIAGNOSIS — M25562 Pain in left knee: Principal | ICD-10-CM

## 2017-11-07 ENCOUNTER — Ambulatory Visit (INDEPENDENT_AMBULATORY_CARE_PROVIDER_SITE_OTHER): Payer: Medicare Other

## 2017-11-07 DIAGNOSIS — Z23 Encounter for immunization: Secondary | ICD-10-CM

## 2017-12-02 ENCOUNTER — Other Ambulatory Visit: Payer: Self-pay

## 2017-12-02 ENCOUNTER — Inpatient Hospital Stay: Payer: Medicare Other | Attending: Hematology and Oncology

## 2017-12-02 ENCOUNTER — Inpatient Hospital Stay (HOSPITAL_BASED_OUTPATIENT_CLINIC_OR_DEPARTMENT_OTHER): Payer: Medicare Other | Admitting: Hematology and Oncology

## 2017-12-02 ENCOUNTER — Encounter: Payer: Self-pay | Admitting: Hematology and Oncology

## 2017-12-02 VITALS — BP 174/90 | HR 90 | Temp 97.9°F | Resp 18 | Ht 63.0 in | Wt 187.8 lb

## 2017-12-02 DIAGNOSIS — Z170 Estrogen receptor positive status [ER+]: Principal | ICD-10-CM

## 2017-12-02 DIAGNOSIS — Z79899 Other long term (current) drug therapy: Secondary | ICD-10-CM | POA: Diagnosis not present

## 2017-12-02 DIAGNOSIS — Z853 Personal history of malignant neoplasm of breast: Secondary | ICD-10-CM | POA: Diagnosis not present

## 2017-12-02 DIAGNOSIS — Z7982 Long term (current) use of aspirin: Secondary | ICD-10-CM

## 2017-12-02 DIAGNOSIS — C50912 Malignant neoplasm of unspecified site of left female breast: Secondary | ICD-10-CM

## 2017-12-02 DIAGNOSIS — Z923 Personal history of irradiation: Secondary | ICD-10-CM

## 2017-12-02 DIAGNOSIS — M85851 Other specified disorders of bone density and structure, right thigh: Secondary | ICD-10-CM | POA: Diagnosis not present

## 2017-12-02 LAB — COMPREHENSIVE METABOLIC PANEL
ALT: 13 U/L (ref 0–44)
AST: 17 U/L (ref 15–41)
Albumin: 4 g/dL (ref 3.5–5.0)
Alkaline Phosphatase: 65 U/L (ref 38–126)
Anion gap: 10 (ref 5–15)
BUN: 25 mg/dL — ABNORMAL HIGH (ref 8–23)
CO2: 24 mmol/L (ref 22–32)
Calcium: 10 mg/dL (ref 8.9–10.3)
Chloride: 103 mmol/L (ref 98–111)
Creatinine, Ser: 1.04 mg/dL — ABNORMAL HIGH (ref 0.44–1.00)
GFR calc Af Amer: 58 mL/min — ABNORMAL LOW (ref >60)
GFR calc non Af Amer: 50 mL/min — ABNORMAL LOW (ref >60)
Glucose, Bld: 116 mg/dL — ABNORMAL HIGH (ref 70–99)
Potassium: 4.2 mmol/L (ref 3.5–5.1)
Sodium: 137 mmol/L (ref 135–145)
Total Bilirubin: 0.8 mg/dL (ref 0.3–1.2)
Total Protein: 6.8 g/dL (ref 6.5–8.1)

## 2017-12-02 LAB — CBC WITH DIFFERENTIAL/PLATELET
Abs Immature Granulocytes: 0.02 10*3/uL (ref 0.00–0.07)
Basophils Absolute: 0 10*3/uL (ref 0.0–0.1)
Basophils Relative: 0 %
Eosinophils Absolute: 0.1 10*3/uL (ref 0.0–0.5)
Eosinophils Relative: 1 %
HCT: 38.5 % (ref 36.0–46.0)
Hemoglobin: 12.5 g/dL (ref 12.0–15.0)
Immature Granulocytes: 0 %
Lymphocytes Relative: 20 %
Lymphs Abs: 1 10*3/uL (ref 0.7–4.0)
MCH: 29.8 pg (ref 26.0–34.0)
MCHC: 32.5 g/dL (ref 30.0–36.0)
MCV: 91.7 fL (ref 80.0–100.0)
Monocytes Absolute: 0.4 10*3/uL (ref 0.1–1.0)
Monocytes Relative: 7 %
Neutro Abs: 3.5 10*3/uL (ref 1.7–7.7)
Neutrophils Relative %: 72 %
Platelets: 165 10*3/uL (ref 150–400)
RBC: 4.2 MIL/uL (ref 3.87–5.11)
RDW: 11.7 % (ref 11.5–15.5)
WBC: 4.9 10*3/uL (ref 4.0–10.5)
nRBC: 0 % (ref 0.0–0.2)

## 2017-12-02 NOTE — Progress Notes (Signed)
No new changes noted today 

## 2017-12-02 NOTE — Progress Notes (Signed)
[facility] day:  12/02/2017   Chief Complaint: Angela Bullock is a 82 y.o. female with stage I left breast cancer who is seen for 6 month assessment.  HPI: The patient was last seen in the medical oncology clinic on 05/30/2017.  At that time, she was doing well. She denied acute complaints. Exam revealed a new area of firmness in her LEFT breast at the 3 o'clock position.   Diagnostic left mammogram and ultrasound revealed no evidence of recurrent or new breast malignancy.  There was benign postsurgical changes on the left.  There was a benign 1.9 x 1.1 x 1.5 cm cyst in the retroareolar left breast.   During the interim, patient is doing well. She denies any acute concerns. Patient is no longer taking her oral bisphosphonate (Boniva). She has been on a drug holiday for the last 3 months. Patient denies that she has experienced any B symptoms. She denies any interval infections.  Patient does not verbalize any concerns with regards to her breasts today. Patient does not routinely perform monthly self breast examinations as recommended.   Patient complains of pain in her knees. She notes that her legs are weak. She is on daily statin therapy.   Patient advises that she maintains an adequate appetite. She is eating well. Weight today is 187 lb 12.8 oz (85.2 kg), which compared to her last visit to the clinic, represents a 3 pound weight loss.     Patient denies pain in the clinic today.   Past Medical History:  Diagnosis Date  . Allergy   . Breast cancer ([place]) 01/30/2012   left, radiation and lumpectomy  . Hypertension   . Personal history of radiation therapy     Past Surgical History:  Procedure Laterality Date  . ABDOMINAL HYSTERECTOMY    . BREAST BIOPSY Left 01/30/2011   negative  . BREAST BIOPSY Left 01/30/2012   positive  . BREAST CYST ASPIRATION Left   . BREAST LUMPECTOMY Left 04/02/2012   positive  . COLONOSCOPY WITH PROPOFOL N/A  07/23/2017   Procedure: COLONOSCOPY WITH PROPOFOL WITH BIOPSIES;  Surgeon: Jonathon Bellows, MD;  Location: [facility] ENDOSCOPY;  Service: Gastroenterology;  Laterality: N/A;    Family History  Problem Relation Age of Onset  . Breast cancer Paternal Aunt 85  . Osteoporosis Mother   . Heart attack Father     Social History:  reports that she has never smoked. She has never used smokeless tobacco. She reports that she does not drink alcohol or use drugs.  The patient lives in [place].  The patient is alone today.  Allergies:  Allergies  Allergen Reactions  . Penicillins Rash    Current Medications: Current Outpatient Medications  Medication Sig Dispense Refill  . aspirin EC 81 MG tablet Take 1 tablet (81 mg total) by mouth daily.    Marland Kitchen atorvastatin (LIPITOR) 10 MG tablet TAKE 1 TABLET(10 MG) BY MOUTH AT BEDTIME 90 tablet 1  . Biotin 10 MG CAPS Take 10 mg by mouth daily.    . calcium carbonate (OSCAL) 1500 (600 CA) MG TABS tablet Take by mouth 2 (two) times daily with a meal.    . cholecalciferol (VITAMIN D) 1000 UNITS tablet Take 1,000 Units by mouth daily.    Marland Kitchen co-enzyme Q-10 50 MG capsule Take 50 mg by mouth daily.    Marland Kitchen glucosamine-chondroitin 500-400 MG tablet Take 1 tablet by mouth 3 (three) times daily.    Marland Kitchen lisinopril (  PRINIVIL,ZESTRIL) 10 MG tablet Take 1 tablet (10 mg total) by mouth daily. 90 tablet 3  . Multiple Vitamins-Minerals (CENTRUM SILVER PO) Take 1 tablet by mouth daily.    . Omega-3 Krill Oil 300 MG CAPS Take 1 capsule by mouth daily.    Marland Kitchen ibandronate (BONIVA) 150 MG tablet TAKE 1 TABLET BY MOUTH MONTHLY (Patient not taking: Reported on 12/02/2017) 3 tablet 5  . meloxicam (MOBIC) 15 MG tablet TAKE 1 TABLET BY MOUTH DAILY AS NEEDED FOR PAIN. PREFER TO TAKE UP TO 1 TO 2 WEEKS AT A TIME THEN STOP (Patient not taking: Reported on 12/02/2017) 30 tablet 3  . Probiotic Product ([place]) CAPS Take by mouth.    . traMADol (ULTRAM) 50 MG tablet Take 1 tablet (50 mg total)  by mouth every 8 (eight) hours as needed for moderate pain or severe pain. (Patient not taking: Reported on 12/02/2017) 30 tablet 1   No current facility-administered medications for this visit.     Review of Systems  Constitutional: Positive for weight loss (down 3 pounds). Negative for diaphoresis, fever and malaise/fatigue.  HENT: Negative.   Eyes: Negative.   Respiratory: Negative for cough, hemoptysis, sputum production and shortness of breath.   Cardiovascular: Negative for chest pain, palpitations, orthopnea, leg swelling and PND.  Gastrointestinal: Negative for abdominal pain, blood in stool, constipation, diarrhea, melena, nausea and vomiting.  Genitourinary: Negative for dysuria, frequency, hematuria and urgency.  Musculoskeletal: Positive for joint pain (knees). Negative for back pain, falls and myalgias.  Skin: Negative for itching and rash.  Neurological: Positive for weakness (BLE). Negative for dizziness, tremors and headaches.  Endo/Heme/Allergies: Does not bruise/bleed easily.  Psychiatric/Behavioral: Negative for depression, memory loss and suicidal ideas. The patient is not nervous/anxious and does not have insomnia.   All other systems reviewed and are negative.  Performance status (ECOG): 1 - Symptomatic but completely ambulatory  Vital Signs BP (!) 174/90 (BP Location: Right Arm, Patient Position: Sitting)   Pulse 90   Temp 97.9 F (36.6 C) (Tympanic)   Resp 18   Ht '5\' 3"'$  (1.6 m)   Wt 187 lb 12.8 oz (85.2 kg)   SpO2 98%   BMI 33.27 kg/m   Physical Exam  Constitutional: She is oriented to person, place, and time. She appears distressed.  HENT:  Head: Normocephalic and atraumatic.  Mouth/Throat: Oropharynx is clear and moist and mucous membranes are normal. No oropharyngeal exudate.  Short thin brown hair.  Eyes: Pupils are equal, round, and reactive to light. Conjunctivae and EOM are normal. No scleral icterus.  Blue eyes.  Neck: Normal range of motion.  Neck supple. No JVD present.  Cardiovascular: Normal rate, regular rhythm, normal heart sounds and intact distal pulses. Exam reveals no gallop and no friction rub.  No murmur heard. Pulmonary/Chest: Effort normal and breath sounds normal. No respiratory distress. She has no wheezes. She has no rales. Right breast exhibits no inverted nipple, no mass, no nipple discharge, no skin change and no tenderness. Left breast exhibits skin change (5 x 3.5 cm firmness around lateral incision at the 3 o'clock position.). Left breast exhibits no inverted nipple, no mass, no nipple discharge and no tenderness.  Abdominal: Soft. Bowel sounds are normal. She exhibits no distension and no mass. There is no abdominal tenderness. There is no rebound and no guarding.  Musculoskeletal: Normal range of motion. She exhibits no edema or tenderness.  Lymphadenopathy:    She has no cervical adenopathy.    She  has no axillary adenopathy.       Right: No inguinal and no supraclavicular adenopathy present.       Left: No inguinal and no supraclavicular adenopathy present.  Neurological: She is alert and oriented to person, place, and time.  Skin: Skin is warm and dry. No rash noted. She is not diaphoretic. No erythema.  Psychiatric: Mood, affect and judgment normal.  Nursing note and vitals reviewed.   Orders Only on 12/02/2017  Component Date Value Ref Range Status  . Sodium 12/02/2017 137  135 - 145 mmol/L Final  . Potassium 12/02/2017 4.2  3.5 - 5.1 mmol/L Final  . Chloride 12/02/2017 103  98 - 111 mmol/L Final  . CO2 12/02/2017 24  22 - 32 mmol/L Final  . Glucose, Bld 12/02/2017 116* 70 - 99 mg/dL Final  . BUN 12/02/2017 25* 8 - 23 mg/dL Final  . Creatinine, Ser 12/02/2017 1.04* 0.44 - 1.00 mg/dL Final  . Calcium 12/02/2017 10.0  8.9 - 10.3 mg/dL Final  . Total Protein 12/02/2017 6.8  6.5 - 8.1 g/dL Final  . Albumin 12/02/2017 4.0  3.5 - 5.0 g/dL Final  . AST 12/02/2017 17  15 - 41 U/L Final  . ALT 12/02/2017  13  0 - 44 U/L Final  . Alkaline Phosphatase 12/02/2017 65  38 - 126 U/L Final  . Total Bilirubin 12/02/2017 0.8  0.3 - 1.2 mg/dL Final  . GFR calc non Af Amer 12/02/2017 50* >60 mL/min Final  . GFR calc Af Amer 12/02/2017 58* >60 mL/min Final   Comment: (NOTE) The eGFR has been calculated using the CKD EPI equation. This calculation has not been validated in all clinical situations. eGFR's persistently <60 mL/min signify possible Chronic Kidney Disease.   Georgiann Hahn gap 12/02/2017 10  5 - 15 Final   Performed at [facility], [address] [place], [place] [address]  . WBC 12/02/2017 4.9  4.0 - 10.5 K/uL Final  . RBC 12/02/2017 4.20  3.87 - 5.11 MIL/uL Final  . Hemoglobin 12/02/2017 12.5  12.0 - 15.0 g/dL Final  . HCT 12/02/2017 38.5  36.0 - 46.0 % Final  . MCV 12/02/2017 91.7  80.0 - 100.0 fL Final  . MCH 12/02/2017 29.8  26.0 - 34.0 pg Final  . MCHC 12/02/2017 32.5  30.0 - 36.0 g/dL Final  . RDW 12/02/2017 11.7  11.5 - 15.5 % Final  . Platelets 12/02/2017 165  150 - 400 K/uL Final  . nRBC 12/02/2017 0.0  0.0 - 0.2 % Final  . Neutrophils Relative % 12/02/2017 72  % Final  . Neutro Abs 12/02/2017 3.5  1.7 - 7.7 K/uL Final  . Lymphocytes Relative 12/02/2017 20  % Final  . Lymphs Abs 12/02/2017 1.0  0.7 - 4.0 K/uL Final  . Monocytes Relative 12/02/2017 7  % Final  . Monocytes Absolute 12/02/2017 0.4  0.1 - 1.0 K/uL Final  . Eosinophils Relative 12/02/2017 1  % Final  . Eosinophils Absolute 12/02/2017 0.1  0.0 - 0.5 K/uL Final  . Basophils Relative 12/02/2017 0  % Final  . Basophils Absolute 12/02/2017 0.0  0.0 - 0.1 K/uL Final  . Immature Granulocytes 12/02/2017 0  % Final  . Abs Immature Granulocytes 12/02/2017 0.02  0.00 - 0.07 K/uL Final   Performed at [facility], [address] [place], [place] [address]    Assessment:  Angela Bullock is a 82 y.o. female with a stage I left breast cancer status post excisional  biopsy on 04/02/2012. Pathology revealed a 1.3 cm  moderately differentiated basal cell carcinoma. There was no DCIS or vascular invasion. Tumor extended to the lateral margin.  Pathology was ER +100%, PR +73% and HER-2/neu 1+.  She then underwent left partial mastectomy with sentinel lymph node biopsy on 05/02/2012.  Pathology revealed no evidence of residual malignancy. Margins were negative. Zero of 6 sentinel lymph nodes were positive. Pathologic stage was [id number].  She underwent Mammosite radiation. She began Arimidex on 05/2012. She took Arimidex for approximately 1 year. She discontinued Arimidex in 05/2013 as she did not like the side effect profile.  She decided against any other hormonal therapy.   Mammogram and right ultrasound on 10/17/2015 revealed a 1.8 cm benign cyst within the left breast at the 12 o'clock retroareolar location. Bilateral diagnostic mammogram on 10/17/2016 revealed no evidence of malignancy in either breast.  Diagnostic left mammogram and ultrasound revealed no evidence of recurrent or new breast malignancy.  There was benign postsurgical changes on the left.  There was a benign 1.9 x 1.1 x 1.5 cm cyst in the retroareolar left breast.   CA27.29 has been followed: 33.7 on 10/21/2014, 26.1 on 04/28/2015, 23.2 on 10/28/2015, 26.9 on 05/08/2016, 28 on 11/15/2016, 24.5 on 05/30/2017, and 25.2 on 12/02/2017.  Bone density study on 10/08/2012 revealed osteopenia (T score -1.8 in left femoral neck).  Bone density study on 10/14/2014 revealed a T-score of -1.7 in the right femur neck.  Bone density study done on 10/17/2016 revealed a T score of -1.6 in the right femoral neck. She is on calcium and vitamin D.  She has been on Boniva x 5 years; stopped around 08/2017.  Symptomatically, patient is doing well overall.  She denies any acute complaints.  No B symptoms or breast concerns.  Exam demonstrates area of firmness in the left breast at the 3 o'clock position.  WBC 4900 ([place] 3500).  BUN 25 and creatinine 1.04 mg/dL (CrCl 45.4  mL/min).  Plan: 1. Labs today:  CBC with diff, CMP, CA27.29. 2. Stage I LEFT breast cancer  Doing well overall.  No breast concerns  Review interval mammogram.  Patient not on endocrine therapy- declined.  Discussed need for MRI imaging of her left breast to further assess an area of firmness at the 3 o'clock position.  Discuss right breast mammogram (no imaging since 10/17/2016).  Follow-up bilateral diagnostic mammogram on 06/11/2018. 3. Osteopenia  Patient has been off of Boniva for the last 3 months (stopped around 08/2017).  Encouraged to restart.    Continue calcium and vitamin D.  Next bone density on 10/18/2018. 4. RTC after breast MRI for MD assessment and review of imaging.   Honor Loh, NP  12/02/2017 3:31 PM   I saw and evaluated the patient, participating in the key portions of the service and reviewing pertinent diagnostic studies and records.  I reviewed the nurse practitioner's note and agree with the findings and the plan.  The assessment and plan were discussed with the patient.  Several questions were asked by the patient and answered.   Nolon Stalls, MD 12/02/2017,3:31 PM

## 2017-12-03 ENCOUNTER — Other Ambulatory Visit: Payer: Self-pay | Admitting: Urgent Care

## 2017-12-03 DIAGNOSIS — Z853 Personal history of malignant neoplasm of breast: Secondary | ICD-10-CM

## 2017-12-03 LAB — CANCER ANTIGEN 27.29: CA 27.29: 25.2 U/mL (ref 0.0–38.6)

## 2017-12-13 ENCOUNTER — Telehealth: Payer: Self-pay | Admitting: Family Medicine

## 2017-12-13 DIAGNOSIS — I10 Essential (primary) hypertension: Secondary | ICD-10-CM

## 2017-12-13 MED ORDER — LISINOPRIL 20 MG PO TABS: 20.0000 mg | ORAL_TABLET | Freq: Every day | ORAL | 3 refills | Status: DC

## 2017-12-13 NOTE — Telephone Encounter (Signed)
Dose increased from Lisinopril 10mg  daily up to 2 pills for 20mg  daily. Controlling her BP well now.  Called patient to confirm, sent in new rx Lisinopril 20mg  - now she takes only one tablet daily, higher dose.  rx sent  Nobie Putnam, [facility] 12/13/2017, 12:47 PM

## 2017-12-13 NOTE — Telephone Encounter (Signed)
Pt. Called requesting bp 2 pills  20mg   Call into  [facility]

## 2017-12-17 ENCOUNTER — Ambulatory Visit
Admission: RE | Admit: 2017-12-17 | Discharge: 2017-12-17 | Disposition: A | Discharge status: Home / Self Care | Payer: Medicare Other | Source: Ambulatory Visit | Attending: Urgent Care | Admitting: Urgent Care

## 2017-12-17 DIAGNOSIS — Z853 Personal history of malignant neoplasm of breast: Secondary | ICD-10-CM | POA: Insufficient documentation

## 2017-12-17 DIAGNOSIS — R922 Inconclusive mammogram: Secondary | ICD-10-CM | POA: Diagnosis not present

## 2018-01-27 ENCOUNTER — Telehealth: Payer: Self-pay | Admitting: *Deleted

## 2018-01-27 NOTE — Telephone Encounter (Signed)
Called patient and LVM regarding her breast MRI.  Dr. Mike Gip has order, however, it does not appear that it was ever done.  Asked patient to return my call to discuss.

## 2018-01-31 ENCOUNTER — Telehealth: Payer: Self-pay | Admitting: *Deleted

## 2018-01-31 NOTE — Telephone Encounter (Signed)
Patient called to say that she had her mammogram and breast US on 12-17-17.  She states she was given the results and everything is okay.  Will not need breast MRI.  Patient will not need to return at this time as indicated in wrap [place] which states she was to rtc after MRI and results.  Patient does not have a follow up appointment.  Would like to know when she needs to rtc at a later date.

## 2018-01-31 NOTE — Telephone Encounter (Signed)
  Patient will need an appointment around 06/02/2018.  M

## 2018-02-14 ENCOUNTER — Other Ambulatory Visit: Payer: Self-pay | Admitting: Family Medicine

## 2018-02-14 DIAGNOSIS — M25561 Pain in right knee: Principal | ICD-10-CM

## 2018-02-14 DIAGNOSIS — G8929 Other chronic pain: Secondary | ICD-10-CM

## 2018-02-14 DIAGNOSIS — M150 Primary generalized (osteo)arthritis: Secondary | ICD-10-CM

## 2018-02-14 DIAGNOSIS — M159 Polyosteoarthritis, unspecified: Secondary | ICD-10-CM

## 2018-02-14 DIAGNOSIS — M25562 Pain in left knee: Principal | ICD-10-CM

## 2018-02-18 ENCOUNTER — Other Ambulatory Visit: Payer: Self-pay | Admitting: Family Medicine

## 2018-02-18 DIAGNOSIS — E785 Hyperlipidemia, unspecified: Secondary | ICD-10-CM

## 2018-03-10 ENCOUNTER — Other Ambulatory Visit: Payer: Medicare Other

## 2018-03-10 DIAGNOSIS — M85851 Other specified disorders of bone density and structure, right thigh: Secondary | ICD-10-CM | POA: Diagnosis not present

## 2018-03-10 DIAGNOSIS — E559 Vitamin D deficiency, unspecified: Secondary | ICD-10-CM

## 2018-03-10 DIAGNOSIS — E785 Hyperlipidemia, unspecified: Secondary | ICD-10-CM | POA: Diagnosis not present

## 2018-03-10 DIAGNOSIS — R7309 Other abnormal glucose: Secondary | ICD-10-CM

## 2018-03-10 DIAGNOSIS — N183 Chronic kidney disease, stage 3 unspecified: Secondary | ICD-10-CM

## 2018-03-10 DIAGNOSIS — I10 Essential (primary) hypertension: Secondary | ICD-10-CM | POA: Diagnosis not present

## 2018-03-11 LAB — COMPLETE METABOLIC PANEL WITH GFR
AG Ratio: 1.7 (calc) (ref 1.0–2.5)
ALT: 13 U/L (ref 6–29)
AST: 18 U/L (ref 10–35)
Albumin: 4.1 g/dL (ref 3.6–5.1)
Alkaline phosphatase (APISO): 72 U/L (ref 37–153)
BILIRUBIN TOTAL: 0.8 mg/dL (ref 0.2–1.2)
BUN/Creatinine Ratio: 17 (calc) (ref 6–22)
BUN: 20 mg/dL (ref 7–25)
CALCIUM: 10.5 mg/dL — AB (ref 8.6–10.4)
CHLORIDE: 105 mmol/L (ref 98–110)
CO2: 28 mmol/L (ref 20–32)
Creat: 1.17 mg/dL — ABNORMAL HIGH (ref 0.60–0.88)
GFR, EST NON AFRICAN AMERICAN: 44 mL/min/{1.73_m2} — AB (ref >=60)
GFR, Est African American: 51 mL/min/{1.73_m2} — ABNORMAL LOW (ref >=60)
GLOBULIN: 2.4 g/dL (ref 1.9–3.7)
Glucose, Bld: 94 mg/dL (ref 65–99)
Potassium: 4.6 mmol/L (ref 3.5–5.3)
SODIUM: 140 mmol/L (ref 135–146)
Total Protein: 6.5 g/dL (ref 6.1–8.1)

## 2018-03-11 LAB — CBC WITH DIFFERENTIAL/PLATELET
ABSOLUTE MONOCYTES: 288 {cells}/uL (ref 200–950)
BASOS ABS: 8 {cells}/uL (ref 0–200)
Basophils Relative: 0.2 %
EOS ABS: 60 {cells}/uL (ref 15–500)
Eosinophils Relative: 1.5 %
HEMATOCRIT: 39.8 % (ref 35.0–45.0)
Hemoglobin: 13.6 g/dL (ref 11.7–15.5)
Lymphs Abs: 884 cells/uL (ref 850–3900)
MCH: 31 pg (ref 27.0–33.0)
MCHC: 34.2 g/dL (ref 32.0–36.0)
MCV: 90.7 fL (ref 80.0–100.0)
MPV: 11.9 fL (ref 7.5–12.5)
Monocytes Relative: 7.2 %
NEUTROS PCT: 69 %
Neutro Abs: 2760 cells/uL (ref 1500–7800)
PLATELETS: 170 10*3/uL (ref 140–400)
RBC: 4.39 10*6/uL (ref 3.80–5.10)
RDW: 11.6 % (ref 11.0–15.0)
TOTAL LYMPHOCYTE: 22.1 %
WBC: 4 10*3/uL (ref 3.8–10.8)

## 2018-03-11 LAB — LIPID PANEL
CHOL/HDL RATIO: 2.4 (calc) (ref <5.0)
CHOLESTEROL: 164 mg/dL (ref <200)
HDL: 67 mg/dL (ref >=50)
LDL Cholesterol (Calc): 77 mg/dL (calc)
NON-HDL CHOLESTEROL (CALC): 97 mg/dL (ref <130)
Triglycerides: 114 mg/dL (ref <150)

## 2018-03-11 LAB — HEMOGLOBIN A1C
Hgb A1c MFr Bld: 5.4 % of total Hgb (ref <5.7)
MEAN PLASMA GLUCOSE: 108 (calc)
eAG (mmol/L): 6 (calc)

## 2018-03-11 LAB — VITAMIN D 25 HYDROXY (VIT D DEFICIENCY, FRACTURES): Vit D, 25-Hydroxy: 50 ng/mL (ref 30–100)

## 2018-03-17 ENCOUNTER — Other Ambulatory Visit: Payer: Self-pay

## 2018-03-17 ENCOUNTER — Encounter: Payer: Self-pay | Admitting: Family Medicine

## 2018-03-17 ENCOUNTER — Ambulatory Visit (INDEPENDENT_AMBULATORY_CARE_PROVIDER_SITE_OTHER): Payer: Medicare Other | Admitting: Family Medicine

## 2018-03-17 VITALS — BP 144/80 | HR 94 | Temp 98.3°F | Resp 16 | Ht 63.0 in | Wt 190.0 lb

## 2018-03-17 DIAGNOSIS — M85851 Other specified disorders of bone density and structure, right thigh: Secondary | ICD-10-CM | POA: Diagnosis not present

## 2018-03-17 DIAGNOSIS — M25561 Pain in right knee: Secondary | ICD-10-CM

## 2018-03-17 DIAGNOSIS — N183 Chronic kidney disease, stage 3 unspecified: Secondary | ICD-10-CM

## 2018-03-17 DIAGNOSIS — E785 Hyperlipidemia, unspecified: Secondary | ICD-10-CM

## 2018-03-17 DIAGNOSIS — M25562 Pain in left knee: Secondary | ICD-10-CM

## 2018-03-17 DIAGNOSIS — Z853 Personal history of malignant neoplasm of breast: Secondary | ICD-10-CM | POA: Diagnosis not present

## 2018-03-17 DIAGNOSIS — M159 Polyosteoarthritis, unspecified: Secondary | ICD-10-CM

## 2018-03-17 DIAGNOSIS — Z170 Estrogen receptor positive status [ER+]: Secondary | ICD-10-CM

## 2018-03-17 DIAGNOSIS — I10 Essential (primary) hypertension: Secondary | ICD-10-CM | POA: Diagnosis not present

## 2018-03-17 DIAGNOSIS — E559 Vitamin D deficiency, unspecified: Secondary | ICD-10-CM | POA: Diagnosis not present

## 2018-03-17 DIAGNOSIS — M150 Primary generalized (osteo)arthritis: Secondary | ICD-10-CM | POA: Diagnosis not present

## 2018-03-17 DIAGNOSIS — C50912 Malignant neoplasm of unspecified site of left female breast: Secondary | ICD-10-CM | POA: Diagnosis not present

## 2018-03-17 DIAGNOSIS — G8929 Other chronic pain: Secondary | ICD-10-CM | POA: Diagnosis not present

## 2018-03-17 NOTE — Progress Notes (Addendum)
Subjective:    Patient ID: Angela Bullock, female    DOB: 1935/12/09, 83 y.o.   MRN: [id number]  Angela Bullock is a 83 y.o. female presenting on 03/17/2018 for Osteoarthritis and Hypertension   HPI   FOLLOW-UP Chronic Pain Multiple Joints / Osteoarthritis Multiple joints (knees, back) - Interval update - she is doing well on tramadol only taking very rarely and sparingly taking 1 pill every 1-3 weeks PRN with good results. - Today doing well currently still has regular knee pain, episodes, some days better, improved on medicine. - Taking Tramadol rarely as above - Taking Meloxicam 15mg  daily for 1-2 weeks at a time, in few week intervals, doing better while on it - Taking Tylenol PRN - Not established with Orthopedics, she was considering future knee injections. Wants to avoid surgery Denies new injury fall or worsening joint swelling or redness or problem  FOLLOW-UP Diarrhea / Change in Bowel Habits Improved off probiotic Recent history AGI, colonoscopy unremarkable negative, some diverticulosis. No new concerns.  CHRONIC HTN / CKD-III Last visit 08/2017, discontinued Metoprlol 12.5mg  BID, doing well off of it. Increased lisinopril 10 to 20 in 11/2017. - Home BP 130s-140 range Current Meds -Lisinopril 20mg  daily Reports good compliance, took meds today. Tolerating well, w/o complaints. Last lab showed stable elevated Creatinine still with CKD  Heart Murmur Chronic problem, has not had any new changes or concerns or new symptoms. She saw heart doctor years ago, no recent ECHO on file. She has been on metoprolol for years now, without known cardiac diagnosis.  Histor of Breast Cancer Followed by [facility].  Osteopenia Off Boniva since 08/2017, drug holiday after 5 years, followed by Heme/Onc  HYPERLIPIDEMIA: - Reports no concerns. Last lipid panel 03/2018, controlled  - Currently taking Atorvastatin 10mg , tolerating well without side effects or myalgias   Health  Maintenance: Updated colonoscopy recently - see above Due for Flu Vaccine - will return. UTD DEXA, dx Osteopenia on Boniva UTD Mammogram surveillance per Oncology    Depression screen [facility] 2/9 03/17/2018 09/11/2017 08/13/2017  Decreased Interest 0 0 0  Down, Depressed, Hopeless 0 0 0  PHQ - 2 Score 0 0 0  Altered sleeping - - -  Tired, decreased energy - - -  Change in appetite - - -  Feeling bad or failure about yourself  - - -  Trouble concentrating - - -  Moving slowly or fidgety/restless - - -  Suicidal thoughts - - -  PHQ-9 Score - - -  Difficult doing work/chores - - -    Past Medical History:  Diagnosis Date  . Allergy   . Breast cancer ([place]) 01/30/2012   left, radiation and lumpectomy  . Hypertension   . Personal history of radiation therapy    Past Surgical History:  Procedure Laterality Date  . ABDOMINAL HYSTERECTOMY    . BREAST BIOPSY Left 01/30/2011   negative  . BREAST BIOPSY Left 01/30/2012   positive  . BREAST CYST ASPIRATION Left   . BREAST LUMPECTOMY Left 04/02/2012   positive  . COLONOSCOPY WITH PROPOFOL N/A 07/23/2017   Procedure: COLONOSCOPY WITH PROPOFOL WITH BIOPSIES;  Surgeon: Jonathon Bellows, MD;  Location: [facility] ENDOSCOPY;  Service: Gastroenterology;  Laterality: N/A;   Social History   Socioeconomic History  . Marital status: Married    Spouse name: Not on file  . Number of children: Not on file  . Years of education: 76  . Highest education level: High school graduate  Occupational  History  . Not on file  Social Needs  . Financial resource strain: Not hard at all  . Food insecurity:    Worry: Never true    Inability: Never true  . Transportation needs:    Medical: No    Non-medical: No  Tobacco Use  . Smoking status: Never Smoker  . Smokeless tobacco: Never Used  Substance and Sexual Activity  . Alcohol use: No    Alcohol/week: 0.0 standard drinks  . Drug use: No  . Sexual activity: Yes  Lifestyle  . Physical activity:    Days per week:  0 days    Minutes per session: 0 min  . Stress: Not at all  Relationships  . Social connections:    Talks on phone: More than three times a week    Gets together: More than three times a week    Attends religious service: Never    Active member of club or organization: Yes    Attends meetings of clubs or organizations: More than 4 times per year    Relationship status: Married  . Intimate partner violence:    Fear of current or ex partner: No    Emotionally abused: No    Physically abused: No    Forced sexual activity: No  Other Topics Concern  . Not on file  Social History Narrative  . Not on file   Family History  Problem Relation Age of Onset  . Breast cancer Paternal Aunt 85  . Osteoporosis Mother   . Heart attack Father    Current Outpatient Medications on File Prior to Visit  Medication Sig  . aspirin EC 81 MG tablet Take 1 tablet (81 mg total) by mouth daily.  Marland Kitchen atorvastatin (LIPITOR) 10 MG tablet TAKE 1 TABLET(10 MG) BY MOUTH AT BEDTIME  . Biotin 10 MG CAPS Take 10 mg by mouth daily.  . calcium carbonate (OSCAL) 1500 (600 CA) MG TABS tablet Take 1 tablet by mouth every other day.  . cholecalciferol (VITAMIN D) 1000 UNITS tablet Take 1,000 Units by mouth daily.  Marland Kitchen co-enzyme Q-10 50 MG capsule Take 50 mg by mouth daily.  Marland Kitchen glucosamine-chondroitin 500-400 MG tablet Take 1 tablet by mouth 3 (three) times daily.  Marland Kitchen lisinopril (PRINIVIL,ZESTRIL) 20 MG tablet Take 1 tablet (20 mg total) by mouth daily.  . meloxicam (MOBIC) 15 MG tablet TAKE 1 TABLET BY MOUTH DAILY AS NEEDED FOR PAIN. PREFER TO TAKE UP TO 1 TO 2 WEEKS AT A TIME THEN STOP  . Multiple Vitamins-Minerals (CENTRUM SILVER PO) Take 1 tablet by mouth daily.  . Omega-3 Krill Oil 300 MG CAPS Take 1 capsule by mouth daily.  . traMADol (ULTRAM) 50 MG tablet Take 1 tablet (50 mg total) by mouth every 8 (eight) hours as needed for moderate pain or severe pain.   No current facility-administered medications on file prior  to visit.     Review of Systems Per HPI unless specifically indicated above     Objective:    BP (!) 144/80 (BP Location: Left Arm, Cuff Size: Normal)   Pulse 94   Temp 98.3 F (36.8 C) (Oral)   Resp 16   Ht 5\' 3"  (1.6 m)   Wt 190 lb (86.2 kg)   BMI 33.66 kg/m   Wt Readings from Last 3 Encounters:  03/17/18 190 lb (86.2 kg)  12/02/17 187 lb 12.8 oz (85.2 kg)  09/11/17 187 lb (84.8 kg)    Physical Exam Vitals signs and nursing note  reviewed.  Constitutional:      General: She is not in acute distress.    Appearance: She is well-developed. She is not diaphoretic.     Comments: Well-appearing, comfortable, cooperative  HENT:     Head: Normocephalic and atraumatic.  Eyes:     General:        Right eye: No discharge.        Left eye: No discharge.     Conjunctiva/sclera: Conjunctivae normal.  Neck:     Musculoskeletal: Normal range of motion and neck supple.     Thyroid: No thyromegaly.  Cardiovascular:     Rate and Rhythm: Normal rate and regular rhythm.     Heart sounds: Normal heart sounds. No murmur.  Pulmonary:     Effort: Pulmonary effort is normal. No respiratory distress.     Breath sounds: Normal breath sounds. No wheezing or rales.  Musculoskeletal: Normal range of motion.  Lymphadenopathy:     Cervical: No cervical adenopathy.  Skin:    General: Skin is warm and dry.     Findings: No erythema or rash.  Neurological:     Mental Status: She is alert and oriented to person, place, and time.  Psychiatric:        Behavior: Behavior normal.     Comments: Well groomed, good eye contact, normal speech and thoughts    Results for orders placed or performed in visit on 03/10/18  VITAMIN D 25 Hydroxy (Vit-D Deficiency, Fractures)  Result Value Ref Range   Vit D, 25-Hydroxy 50 30 - 100 ng/mL  Lipid panel  Result Value Ref Range   Cholesterol 164 <200 mg/dL   HDL 67 > OR = 50 mg/dL   Triglycerides 114 <150 mg/dL   LDL Cholesterol (Calc) 77 mg/dL (calc)    Total CHOL/HDL Ratio 2.4 <5.0 (calc)   Non-HDL Cholesterol (Calc) 97 <130 mg/dL (calc)  COMPLETE METABOLIC PANEL WITH GFR  Result Value Ref Range   Glucose, Bld 94 65 - 99 mg/dL   BUN 20 7 - 25 mg/dL   Creat 1.17 (H) 0.60 - 0.88 mg/dL   GFR, Est Non African American 44 (L) > OR = 60 mL/min/1.76m2   GFR, Est African American 51 (L) > OR = 60 mL/min/1.60m2   BUN/Creatinine Ratio 17 6 - 22 (calc)   Sodium 140 135 - 146 mmol/L   Potassium 4.6 3.5 - 5.3 mmol/L   Chloride 105 98 - 110 mmol/L   CO2 28 20 - 32 mmol/L   Calcium 10.5 (H) 8.6 - 10.4 mg/dL   Total Protein 6.5 6.1 - 8.1 g/dL   Albumin 4.1 3.6 - 5.1 g/dL   Globulin 2.4 1.9 - 3.7 g/dL (calc)   AG Ratio 1.7 1.0 - 2.5 (calc)   Total Bilirubin 0.8 0.2 - 1.2 mg/dL   Alkaline phosphatase (APISO) 72 37 - 153 U/L   AST 18 10 - 35 U/L   ALT 13 6 - 29 U/L  CBC with Differential/Platelet  Result Value Ref Range   WBC 4.0 3.8 - 10.8 Thousand/uL   RBC 4.39 3.80 - 5.10 Million/uL   Hemoglobin 13.6 11.7 - 15.5 g/dL   HCT 39.8 35.0 - 45.0 %   MCV 90.7 80.0 - 100.0 fL   MCH 31.0 27.0 - 33.0 pg   MCHC 34.2 32.0 - 36.0 g/dL   RDW 11.6 11.0 - 15.0 %   Platelets 170 140 - 400 Thousand/uL   MPV 11.9 7.5 - 12.5 fL   Neutro  Abs 2,760 1,500 - 7,800 cells/uL   Lymphs Abs 884 850 - 3,900 cells/uL   Absolute Monocytes 288 200 - 950 cells/uL   Eosinophils Absolute 60 15 - 500 cells/uL   Basophils Absolute 8 0 - 200 cells/uL   Neutrophils Relative % 69 %   Total Lymphocyte 22.1 %   Monocytes Relative 7.2 %   Eosinophils Relative 1.5 %   Basophils Relative 0.2 %  Hemoglobin A1c  Result Value Ref Range   Hgb A1c MFr Bld 5.4 <5.7 % of total Hgb   Mean Plasma Glucose 108 (calc)   eAG (mmol/L) 6.0 (calc)      Assessment & Plan:   Problem List Items Addressed This Visit    Bilateral chronic knee pain   CKD (chronic kidney disease) stage 3, GFR 30-59 ml/min (HCC)    Stable, chronic CKD-III  - Secondary to age, HTN, NSAIDs likely  Plan: 1.  Continue intermittent Meloxicam 1-2 weeks on / off, avoid daily use 2. Improve hydration 3. Control BP 4. Check chemistry Cr trend with Oncology labs      History of breast cancer    Stable, without recurrence See HPI / overview for background on breast cancer Left sided, s/p partial mastectomy after biopsy excision, radiation, 1 yr hormonal therapy Arimidex Continues surveillance per Oncology      Hyperlipidemia    Controlled cholesterol on statin and lifestyle Last lipid panel 2/20 Calculated ASCVD 10 yr risk score >20% primarily d/t age  Plan: 1. Continue current meds - Atorvastatin 10mg  daily 2. Continue ASA 81mg  for primary ASCVD risk reduction 3. Encourage improved lifestyle - low carb/cholesterol, reduce portion size, continue improving regular exercise 4. Follow-up yearly lipids      Hypertension - Primary    Mostly controlled HTN, seems slightly elevated SBP Off BB metoprolol - Home BP readings normal  Complication with CKD-III   Plan:  1. Continue Lisinopril 20mg  daily for now - if elevated BP on home monitor off BB then can double dose Lisinopril to 40mg  daily notify office if need 2. Encourage improved lifestyle - low sodium diet, regular exercise 3. Continue monitor BP outside office, bring readings to next visit, if persistently >140/90 or new symptoms notify office sooner 4. Follow-up 8 mo  Offered ECHOcardiogram given heart murmur and ultimately we agree to defer for now, she is asymptomatic.      RESOLVED: Malignant neoplasm of left breast in female, estrogen receptor positive (HCC)   Osteoarthritis, multiple sites    Stable chronic OA/DJD multiple joints Without recent flare No recent imaging, would benefit from x-ray for characterizing arthritis  Continue NSAID meloxicam PRN, limit regular use Continue Tramadol - re order when ready - rarely taking PRN Refer to Ortho when ready      Osteopenia    Stable chronic problem Prior DEXA Now off  boniva, on drug holiday after >5 years on      Vitamin D deficiency      No orders of the defined types were placed in this encounter.    Follow up plan: Return in about 8 months (around 11/15/2018) for HTN, Arthritis.  Nobie Putnam, [facility] 03/17/2018, 2:35 PM

## 2018-03-17 NOTE — Assessment & Plan Note (Addendum)
Mostly controlled HTN, seems slightly elevated SBP Off BB metoprolol - Home BP readings normal  Complication with CKD-III   Plan:  1. Continue Lisinopril 20mg  daily for now - if elevated BP on home monitor off BB then can double dose Lisinopril to 40mg  daily notify office if need 2. Encourage improved lifestyle - low sodium diet, regular exercise 3. Continue monitor BP outside office, bring readings to next visit, if persistently >140/90 or new symptoms notify office sooner 4. Follow-up 8 mo  Offered ECHOcardiogram given heart murmur and ultimately we agree to defer for now, she is asymptomatic.

## 2018-03-17 NOTE — Patient Instructions (Addendum)
Thank you for coming to the office today.  BP is slightly elevated. Keep track in future we may need to increase Lisinopril if consistently >140.  Heart murmur sounds same. In future we can consider refer to Cardiologist in about 1-2 year for ECHO if you are ready or interested, or if get symptoms short of breath, chest pain, nearly pass out or dizzy notify us sooner  Continue Vitamin D3 1000 unit daily   May reduce calcium to every other day.  Lab results are excellent as discussed.  Notify us if ready for Tramadol refill when ready.  Please schedule a Follow-up Appointment to: Return in about 8 months (around 11/15/2018) for HTN, Arthritis.  If you have any other questions or concerns, please feel free to call the office or send a message through [place]. You may also schedule an earlier appointment if necessary.  Additionally, you may be receiving a survey about your experience at our office within a few days to 1 week by e-mail or mail. We value your feedback.  Nobie Putnam, DO [place]

## 2018-03-18 ENCOUNTER — Other Ambulatory Visit: Payer: Self-pay | Admitting: Family Medicine

## 2018-03-18 DIAGNOSIS — I10 Essential (primary) hypertension: Secondary | ICD-10-CM

## 2018-03-18 NOTE — Assessment & Plan Note (Signed)
Stable, without recurrence See HPI / overview for background on breast cancer Left sided, s/p partial mastectomy after biopsy excision, radiation, 1 yr hormonal therapy Arimidex Continues surveillance per Oncology

## 2018-03-18 NOTE — Assessment & Plan Note (Signed)
Stable, chronic CKD-III  - Secondary to age, HTN, NSAIDs likely  Plan: 1. Continue intermittent Meloxicam 1-2 weeks on / off, avoid daily use 2. Improve hydration 3. Control BP 4. Check chemistry Cr trend with Oncology labs

## 2018-03-18 NOTE — Assessment & Plan Note (Signed)
Stable chronic problem Prior DEXA Now off boniva, on drug holiday after >5 years on

## 2018-03-18 NOTE — Assessment & Plan Note (Signed)
Stable chronic OA/DJD multiple joints Without recent flare No recent imaging, would benefit from x-ray for characterizing arthritis  Continue NSAID meloxicam PRN, limit regular use Continue Tramadol - re order when ready - rarely taking PRN Refer to Ortho when ready

## 2018-03-18 NOTE — Assessment & Plan Note (Signed)
Controlled cholesterol on statin and lifestyle Last lipid panel 2/20 Calculated ASCVD 10 yr risk score >20% primarily d/t age  Plan: 1. Continue current meds - Atorvastatin 10mg  daily 2. Continue ASA 81mg  for primary ASCVD risk reduction 3. Encourage improved lifestyle - low carb/cholesterol, reduce portion size, continue improving regular exercise 4. Follow-up yearly lipids

## 2018-03-24 ENCOUNTER — Telehealth: Payer: Self-pay | Admitting: Family Medicine

## 2018-03-24 DIAGNOSIS — M159 Polyosteoarthritis, unspecified: Secondary | ICD-10-CM

## 2018-03-24 DIAGNOSIS — M542 Cervicalgia: Secondary | ICD-10-CM

## 2018-03-24 DIAGNOSIS — M150 Primary generalized (osteo)arthritis: Principal | ICD-10-CM

## 2018-03-24 MED ORDER — BACLOFEN 10 MG PO TABS: 5.0000 mg | ORAL_TABLET | Freq: Two times a day (BID) | ORAL | 1 refills | Status: DC | PRN

## 2018-03-24 NOTE — Telephone Encounter (Signed)
Patient was last seen 1 week ago 2/17 for routine check up, discussed arthritis, now in past week some gradual worsening neck pain with stiffness and sore, taking meloxicam regularly and tramadol PRN, requesting another medication such as muscle relaxant  Sent baclofen PRN, see rx  Follow-up if not improved  Nobie Putnam, DO [facility] 03/24/2018, 10:34 AM

## 2018-06-02 ENCOUNTER — Ambulatory Visit: Payer: Medicare Other | Admitting: Hematology and Oncology

## 2018-06-02 ENCOUNTER — Other Ambulatory Visit: Payer: Self-pay | Admitting: Family Medicine

## 2018-06-02 DIAGNOSIS — M159 Polyosteoarthritis, unspecified: Secondary | ICD-10-CM

## 2018-06-02 DIAGNOSIS — M542 Cervicalgia: Secondary | ICD-10-CM

## 2018-06-02 DIAGNOSIS — M150 Primary generalized (osteo)arthritis: Principal | ICD-10-CM

## 2018-06-23 ENCOUNTER — Other Ambulatory Visit: Payer: Self-pay | Admitting: Family Medicine

## 2018-06-23 DIAGNOSIS — G8929 Other chronic pain: Secondary | ICD-10-CM

## 2018-06-23 DIAGNOSIS — M159 Polyosteoarthritis, unspecified: Secondary | ICD-10-CM

## 2018-07-09 ENCOUNTER — Telehealth: Payer: Self-pay | Admitting: Family Medicine

## 2018-07-09 NOTE — Chronic Care Management (AMB) (Signed)
Chronic Care Management   Note  07/09/2018 Name: [name] MRN: [id number] DOB: 21-Mar-1935  Angela Bullock is a 83 y.o. year old female who is a primary care patient of Olin Hauser, DO. I reached out to Julianne Rice by phone today in response to a referral sent by Ms. Arnold E Mctigue's health plan.    Ms. Moritz was given information about Chronic Care Management services today including:  1. CCM service includes personalized support from designated clinical staff supervised by her physician, including individualized plan of care and coordination with other care providers 2. 24/7 contact phone numbers for assistance for urgent and routine care needs. 3. Service will only be billed when office clinical staff spend 20 minutes or more in a month to coordinate care. 4. Only one practitioner may furnish and bill the service in a calendar month. 5. The patient may stop CCM services at any time (effective at the end of the month) by phone call to the office staff. 6. The patient will be responsible for cost sharing (co-pay) of up to 20% of the service fee (after annual deductible is met).  Patient agreed to services and verbal consent obtained.   Follow up plan: Telephone appointment with CCM team member scheduled for: 08/18/2018  [place]  ??[email or web address]   ??[phone]

## 2018-07-24 ENCOUNTER — Ambulatory Visit: Payer: Medicare Other | Admitting: Hematology and Oncology

## 2018-08-15 IMAGING — MG DIGITAL DIAGNOSTIC UNILATERAL LEFT MAMMOGRAM WITH TOMO AND CAD
7 series · 8 of 19 positions shown · non-contrast
Comparison: Previous exam(s).

CLINICAL DATA: Patient presents with a reported palpable lump for
ridge along the lumpectomy site of her upper outer left breast.
Patient underwent a left lumpectomy in March 2012.

EXAM:
DIGITAL DIAGNOSTIC LEFT MAMMOGRAM WITH CAD AND TOMO
ULTRASOUND LEFT BREAST

[L MLO synth-2D]
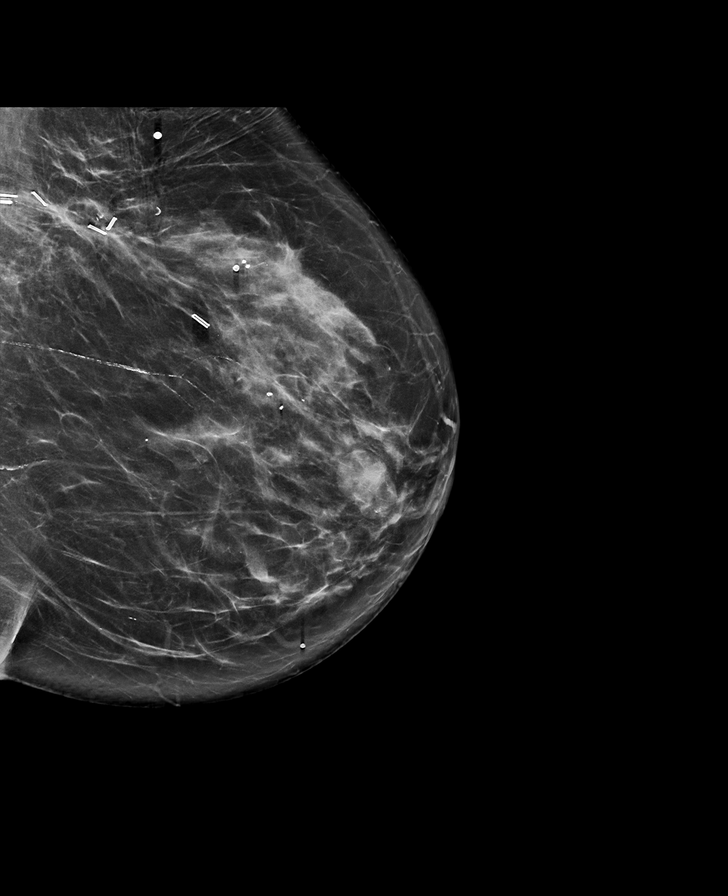

[L CC synth-2D]
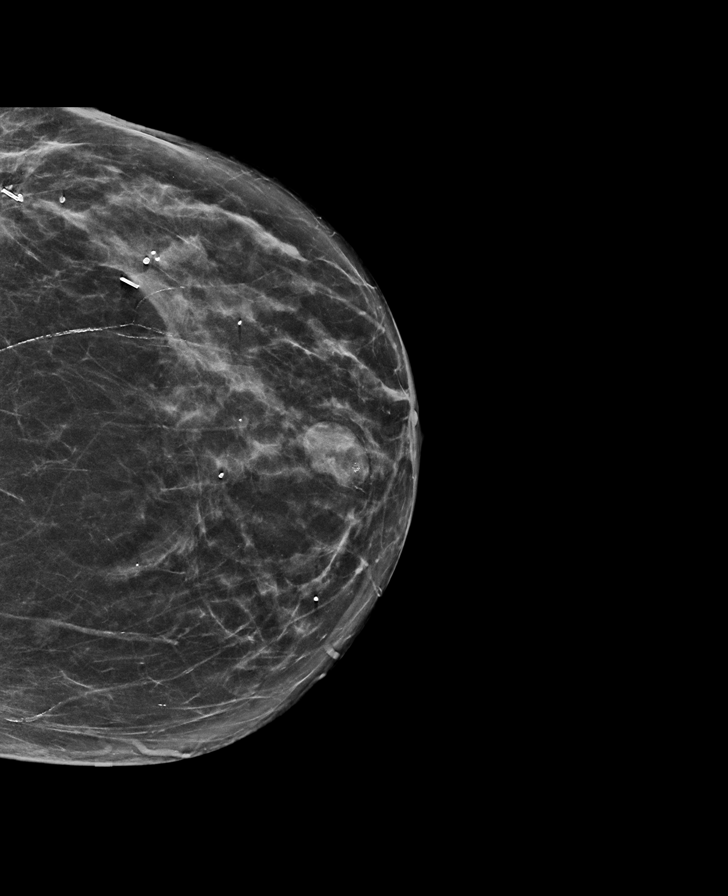

[L XCCL synth-2D]
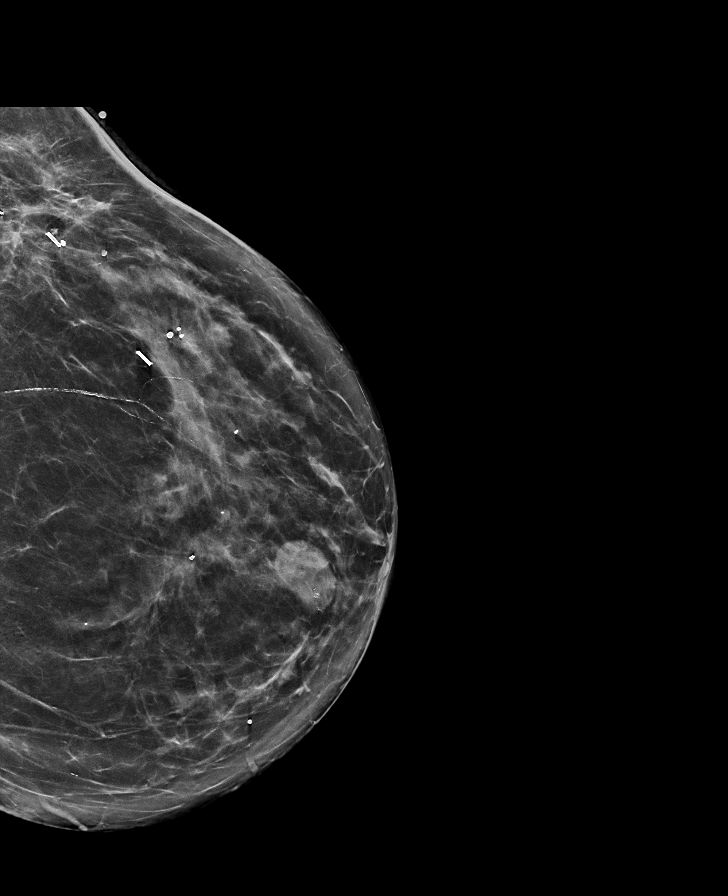

[L CC tomo · 2 of 69 frames shown]
[frame 23/69]
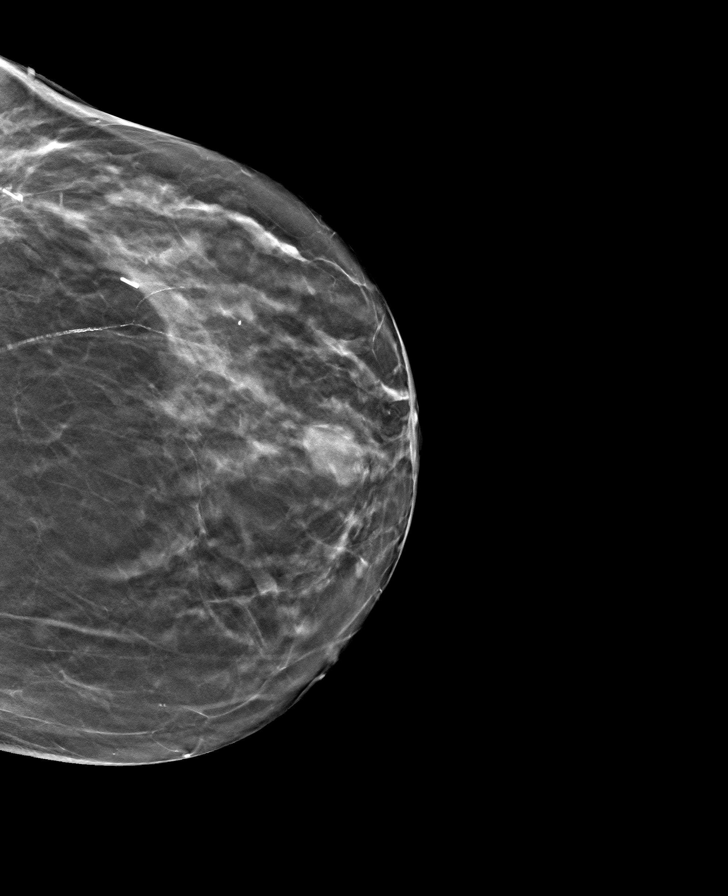
[frame 35/69]
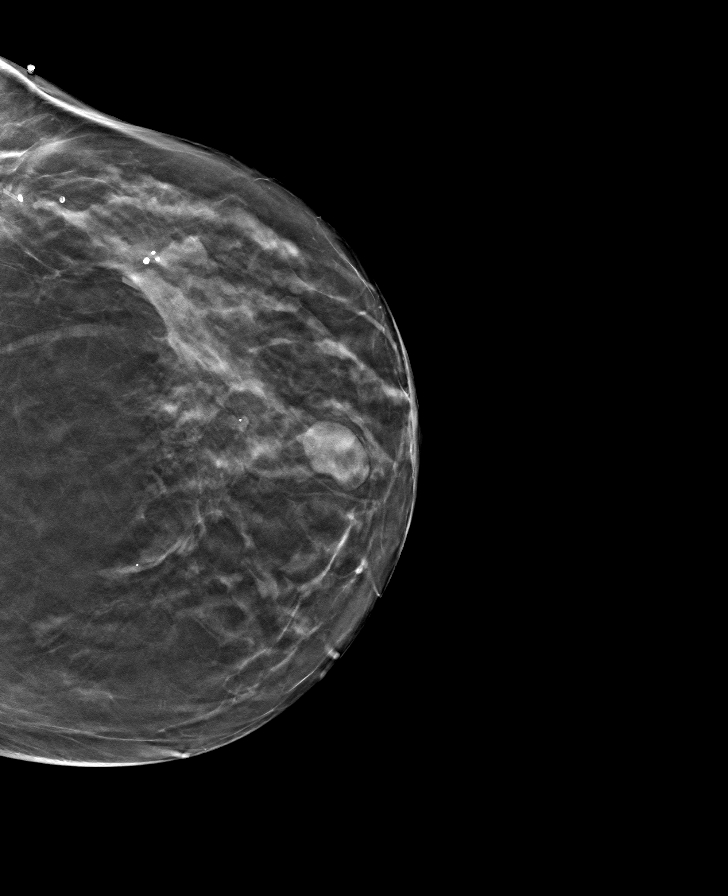

[L MLO tomo · tomo slice 41/80.0]
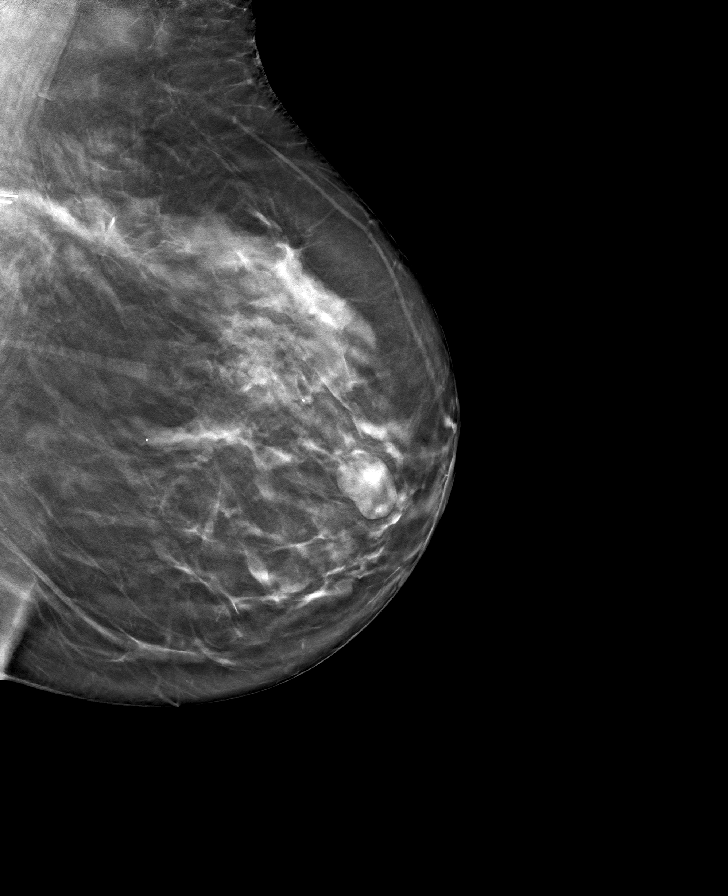

[L XCCL tomo · tomo slice 39/77.0]
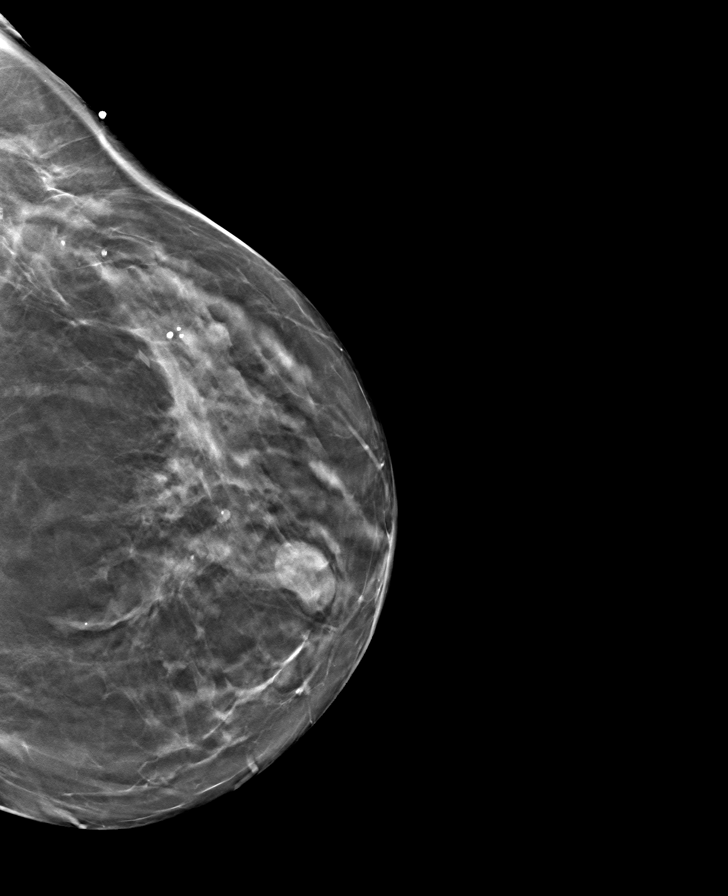

[L CC]
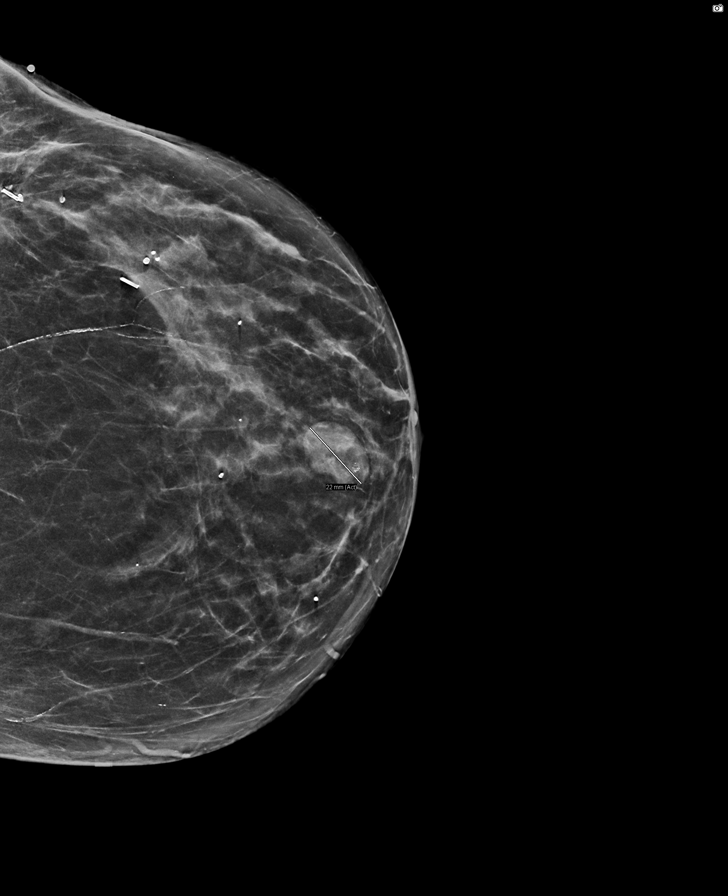

[8 of 19 positions shown; findings below may reference images not displayed]

ACR Breast Density Category c: The breast tissue is heterogeneously
dense, which may obscure small masses.
FINDINGS: In the retroareolar left breast, there is an oval circumscribed mass
with adjacent or associated punctate calcifications, which has
increased in size over the last several years.

Density and distortion in the posterior, upper outer left breast,
reflecting the postsurgical scarring, is stable from the more recent
prior mammograms. There is no mass the lumpectomy site and there are
no new or suspicious calcifications.

Mammographic images were processed with CAD.

On physical exam, there is a prominent firm lump along the upper
outer left breast corresponding to the lumpectomy scar. There are 2
sebaceous cysts in the skin of the left axilla. The retroareolar
mass seen mammographically is not palpable.

Targeted ultrasound is performed, showing heterogeneous
fibroglandular tissue in the upper outer left breast. In the area of
the palpable lump, there is irregular hypoechogenicity with
shadowing consistent with postsurgical scarring. There is an
echogenic linear area within this consistent with avascular surgical
clip. Two sebaceous cyst lie above this in the skin of the left
axilla. No enlarged or abnormal left axillary lymph nodes.

Sonographic evaluation of the left breast at 12 o'clock, 1 cm from
the nipple, retroareolar region, demonstrates a simple cyst
measuring 19 x 11 x 15 mm, consistent in size, shape and location to
the mammographic finding.
IMPRESSION: 1. No evidence of recurrent or new breast malignancy.
2. Benign postsurgical changes on the left.
3. Benign cyst in the retroareolar left breast.

RECOMMENDATION:
1. Diagnostic mammography in 1 year per standard post lumpectomy
protocol.
2. Continue self-examination. If the prominent area corresponding to
the lumpectomy scar appears to be enlarging, repeat imaging,
possibly to include breast MRI, would be indicated.

I have discussed the findings and recommendations with the patient.
Results were also provided in writing at the conclusion of the
visit. If applicable, a reminder letter will be sent to the patient
regarding the next appointment.

BI-RADS CATEGORY  2: Benign.

## 2018-08-17 ENCOUNTER — Other Ambulatory Visit: Payer: Self-pay | Admitting: Family Medicine

## 2018-08-17 DIAGNOSIS — E785 Hyperlipidemia, unspecified: Secondary | ICD-10-CM

## 2018-08-18 ENCOUNTER — Telehealth: Payer: Medicare Other

## 2018-09-15 ENCOUNTER — Ambulatory Visit: Payer: Medicare Other | Admitting: Hematology and Oncology

## 2018-09-22 ENCOUNTER — Telehealth: Payer: Medicare Other

## 2018-09-28 NOTE — Progress Notes (Signed)
[facility]  [address] [place], [place] [address] Phone: [phone]  Fax: [phone]   Telephone Visit:  09/29/2018  Referring physician: Nobie Putnam *  I connected with Angela Bullock on 09/29/2018 at 4:35 PM EDT by telephone and verified that I was speaking with the correct person using 2 identifiers.  The patient was at home.  I discussed the limitations, risk, security and privacy concerns of performing an evaluation and management service by telephone and the availability of in person appointments.  I also discussed with the patient that there may be a patient responsible charge related to this service.  The patient expressed understanding and agreed to proceed.   Chief Complaint: Angela Bullock is a 83 y.o. female with stage I left breast cancer who is seen for 9 month assessment.  HPI: The patient was last seen in the medical oncology clinic on 12/02/2017.  At that time, patient was doing well overall.  She denied any acute complaints.  No B symptoms or breast concerns.  Exam demonstrated area of firmness in the left breast at the 3 o'clock position.  CA27.29 was normal (25.2).   Bilateral diagnostic mammogram and ultrasound on 12/17/2017 revealed no evidence of malignancy.   During the interim, she notes no changes in her health.  She continues to have back knee and neck pain secondary to arthritis.  She is not exercising as well.  Weight is stable.  She states that she did not have her breast MRI as radiology felt it was not necessary.   Past Medical History:  Diagnosis Date  . Allergy   . Breast cancer ([place]) 01/30/2012   left, radiation and lumpectomy  . Hypertension   . Personal history of radiation therapy     Past Surgical History:  Procedure Laterality Date  . ABDOMINAL HYSTERECTOMY    . BREAST BIOPSY Left 01/30/2011   negative  . BREAST BIOPSY Left 01/30/2012   positive  . BREAST CYST ASPIRATION Left   . BREAST  LUMPECTOMY Left 04/02/2012   positive  . COLONOSCOPY WITH PROPOFOL N/A 07/23/2017   Procedure: COLONOSCOPY WITH PROPOFOL WITH BIOPSIES;  Surgeon: Jonathon Bellows, MD;  Location: [facility] ENDOSCOPY;  Service: Gastroenterology;  Laterality: N/A;    Family History  Problem Relation Age of Onset  . Breast cancer Paternal Aunt 85  . Osteoporosis Mother   . Heart attack Father     Social History:  reports that she has never smoked. She has never used smokeless tobacco. She reports that she does not drink alcohol or use drugs. The patient lives in [place].  The patient is alone today.  Participants in the patient's visit and their role in the encounter included the patient and Vito Berger, CMA, today.  The intake visit was provided by Vito Berger, CMA.    Allergies:  Allergies  Allergen Reactions  . Penicillins Rash    Current Medications: Current Outpatient Medications  Medication Sig Dispense Refill  . Biotin 10 MG CAPS Take 10 mg by mouth daily.    . calcium carbonate (OSCAL) 1500 (600 CA) MG TABS tablet Take 1 tablet by mouth every other day.    . cholecalciferol (VITAMIN D) 1000 UNITS tablet Take 1,000 Units by mouth daily.    Marland Kitchen co-enzyme Q-10 50 MG capsule Take 50 mg by mouth daily.    Marland Kitchen glucosamine-chondroitin 500-400 MG tablet Take 1 tablet by mouth 3 (three) times daily.    Marland Kitchen lisinopril (PRINIVIL,ZESTRIL) 20 MG tablet  Take 1 tablet (20 mg total) by mouth daily. 90 tablet 3  . Multiple Vitamins-Minerals (CENTRUM SILVER PO) Take 1 tablet by mouth daily.    . Omega-3 Krill Oil 300 MG CAPS Take 1 capsule by mouth daily.    Marland Kitchen aspirin EC 81 MG tablet Take 1 tablet (81 mg total) by mouth daily. (Patient not taking: Reported on 09/29/2018)    . atorvastatin (LIPITOR) 10 MG tablet TAKE 1 TABLET(10 MG) BY MOUTH AT BEDTIME (Patient not taking: Reported on 09/29/2018) 90 tablet 1  . baclofen (LIORESAL) 10 MG tablet TAKE 1/2 TO 1 TABLET(5 TO 10 MG) BY MOUTH TWICE DAILY AS NEEDED FOR MUSCLE  SPASMS (Patient not taking: Reported on 09/29/2018) 30 tablet 2  . meloxicam (MOBIC) 15 MG tablet TAKE 1 TABLET BY MOUTH EVERY DAY AS NEEDED FOR PAIN, PREFER TO TAKE UP TO 1- 2 WEEKS AT A TIME THEN STOP (Patient not taking: Reported on 09/29/2018) 30 tablet 3  . traMADol (ULTRAM) 50 MG tablet Take 1 tablet (50 mg total) by mouth every 8 (eight) hours as needed for moderate pain or severe pain. (Patient not taking: Reported on 09/29/2018) 30 tablet 1   No current facility-administered medications for this visit.     Review of Systems  Constitutional: Negative.  Negative for chills, diaphoresis, fever, malaise/fatigue and weight loss (stable).       "No change in health".  HENT: Negative.  Negative for congestion, hearing loss, nosebleeds, sinus pain and sore throat.   Eyes: Negative.  Negative for blurred vision and double vision.  Respiratory: Negative.  Negative for cough, hemoptysis, sputum production and shortness of breath.   Cardiovascular: Negative.  Negative for chest pain, palpitations, orthopnea, leg swelling and PND.  Gastrointestinal: Negative.  Negative for abdominal pain, blood in stool, constipation, diarrhea, melena, nausea and vomiting.  Genitourinary: Negative.  Negative for dysuria, frequency, hematuria and urgency.  Musculoskeletal: Positive for back pain, joint pain (knees) and neck pain. Negative for falls and myalgias.  Skin: Negative.  Negative for itching and rash.  Neurological: Negative for dizziness, tremors, sensory change, speech change, focal weakness, weakness and headaches.  Endo/Heme/Allergies: Negative.  Does not bruise/bleed easily.  Psychiatric/Behavioral: Negative.  Negative for depression, memory loss and substance abuse. The patient is not nervous/anxious and does not have insomnia.   All other systems reviewed and are negative.  Performance status (ECOG): 1   No visits with results within 3 Day(s) from this visit.  Latest known visit with results is:   Appointment on 03/10/2018  Component Date Value Ref Range Status  . Vit D, 25-Hydroxy 03/10/2018 50  30 - 100 ng/mL Final   Comment: Vitamin D Status         25-OH Vitamin D: . Deficiency:                    <20 ng/mL Insufficiency:             20 - 29 ng/mL Optimal:                 > or = 30 ng/mL . For 25-OH Vitamin D testing on patients on  D2-supplementation and patients for whom quantitation  of D2 and D3 fractions is required, the QuestAssureD(TM) 25-OH VIT D, (D2,D3), LC/MS/MS is recommended: order  code [id number] (patients >4yr). . For more information on this test, go to: http://education.questdiagnostics.com/faq/FAQ163 (This link is being provided for  informational/educational purposes only.)   . Cholesterol 03/10/2018 164  <200  mg/dL Final  . HDL 03/10/2018 67  > OR = 50 mg/dL Final  . Triglycerides 03/10/2018 114  <150 mg/dL Final  . LDL Cholesterol (Calc) 03/10/2018 77  mg/dL (calc) Final   Comment: Reference range: <100 . Desirable range <100 mg/dL for primary prevention;   <70 mg/dL for patients with CHD or diabetic patients  with > or = 2 CHD risk factors. Marland Kitchen LDL-C is now calculated using the Martin-Hopkins  calculation, which is a validated novel method providing  better accuracy than the Friedewald equation in the  estimation of LDL-C.  Cresenciano Genre et al. Annamaria Helling. 1157;262(03): 2061-2068  (http://education.[facility].com/faq/FAQ164)   . Total CHOL/HDL Ratio 03/10/2018 2.4  <5.0 (calc) Final  . Non-HDL Cholesterol (Calc) 03/10/2018 97  <130 mg/dL (calc) Final   Comment: For patients with diabetes plus 1 major ASCVD risk  factor, treating to a non-HDL-C goal of <100 mg/dL  (LDL-C of <70 mg/dL) is considered a therapeutic  option.   . Glucose, Bld 03/10/2018 94  65 - 99 mg/dL Final   Comment: .            Fasting reference interval .   . BUN 03/10/2018 20  7 - 25 mg/dL Final  . Creat 03/10/2018 1.17* 0.60 - 0.88 mg/dL Final   Comment: For patients >68  years of age, the reference limit for Creatinine is approximately 13% higher for people identified as African-American. .   . GFR, Est Non African American 03/10/2018 44* > OR = 60 mL/min/1.26m Final  . GFR, Est African American 03/10/2018 51* > OR = 60 mL/min/1.748mFinal  . BUN/Creatinine Ratio 03/10/2018 17  6 - 22 (calc) Final  . Sodium 03/10/2018 140  135 - 146 mmol/L Final  . Potassium 03/10/2018 4.6  3.5 - 5.3 mmol/L Final  . Chloride 03/10/2018 105  98 - 110 mmol/L Final  . CO2 03/10/2018 28  20 - 32 mmol/L Final  . Calcium 03/10/2018 10.5* 8.6 - 10.4 mg/dL Final  . Total Protein 03/10/2018 6.5  6.1 - 8.1 g/dL Final  . Albumin 03/10/2018 4.1  3.6 - 5.1 g/dL Final  . Globulin 03/10/2018 2.4  1.9 - 3.7 g/dL (calc) Final  . AG Ratio 03/10/2018 1.7  1.0 - 2.5 (calc) Final  . Total Bilirubin 03/10/2018 0.8  0.2 - 1.2 mg/dL Final  . Alkaline phosphatase (APISO) 03/10/2018 72  37 - 153 U/L Final  . AST 03/10/2018 18  10 - 35 U/L Final  . ALT 03/10/2018 13  6 - 29 U/L Final  . WBC 03/10/2018 4.0  3.8 - 10.8 Thousand/uL Final  . RBC 03/10/2018 4.39  3.80 - 5.10 Million/uL Final  . Hemoglobin 03/10/2018 13.6  11.7 - 15.5 g/dL Final  . HCT 03/10/2018 39.8  35.0 - 45.0 % Final  . MCV 03/10/2018 90.7  80.0 - 100.0 fL Final  . MCH 03/10/2018 31.0  27.0 - 33.0 pg Final  . MCHC 03/10/2018 34.2  32.0 - 36.0 g/dL Final  . RDW 03/10/2018 11.6  11.0 - 15.0 % Final  . Platelets 03/10/2018 170  140 - 400 Thousand/uL Final  . MPV 03/10/2018 11.9  7.5 - 12.5 fL Final  . Neutro Abs 03/10/2018 2,760  1,500 - 7,800 cells/uL Final  . Lymphs Abs 03/10/2018 884  850 - 3,900 cells/uL Final  . Absolute Monocytes 03/10/2018 288  200 - 950 cells/uL Final  . Eosinophils Absolute 03/10/2018 60  15 - 500 cells/uL Final  . Basophils Absolute 03/10/2018 8  0 -  200 cells/uL Final  . Neutrophils Relative % 03/10/2018 69  % Final  . Total Lymphocyte 03/10/2018 22.1  % Final  . Monocytes Relative 03/10/2018 7.2   % Final  . Eosinophils Relative 03/10/2018 1.5  % Final  . Basophils Relative 03/10/2018 0.2  % Final  . Hgb A1c MFr Bld 03/10/2018 5.4  <5.7 % of total Hgb Final   Comment: For the purpose of screening for the presence of diabetes: . <5.7%       Consistent with the absence of diabetes 5.7-6.4%    Consistent with increased risk for diabetes             (prediabetes) > or =6.5%  Consistent with diabetes . This assay result is consistent with a decreased risk of diabetes. . Currently, no consensus exists regarding use of hemoglobin A1c for diagnosis of diabetes in children. . According to American Diabetes Association (ADA) guidelines, hemoglobin A1c <7.0% represents optimal control in non-pregnant diabetic patients. Different metrics may apply to specific patient populations.  Standards of Medical Care in Diabetes(ADA). .   . Mean Plasma Glucose 03/10/2018 108  (calc) Final  . eAG (mmol/L) 03/10/2018 6.0  (calc) Final    Assessment:  Angela Bullock is a 83 y.o. female with a stage I left breast cancer status post excisional biopsy on 04/02/2012. Pathology revealed a 1.3 cm moderately differentiated basal cell carcinoma. There was no DCIS or vascular invasion. Tumor extended to the lateral margin.  Pathology was ER +100%, PR +73% and HER-2/neu 1+.  She then underwent left partial mastectomy with sentinel lymph node biopsy on 05/02/2012.  Pathology revealed no evidence of residual malignancy. Margins were negative. Zero of 6 sentinel lymph nodes were positive. Pathologic stage was T1cN0M0.  She underwent Mammosite radiation. She began Arimidex on 05/2012. She took Arimidex for approximately 1 year. She discontinued Arimidex in 05/2013 as she did not like the side effect profile.  She decided against any other hormonal therapy.   Mammogram and right ultrasound on 10/17/2015 revealed a 1.8 cm benign cyst within the left breast at the 12 o'clock retroareolar location. Bilateral diagnostic  mammogram on 10/17/2016 revealed no evidence of malignancy in either breast.  Diagnostic left mammogram and ultrasound on 06/10/2017 revealed no evidence of recurrent or new breast malignancy.  There was benign postsurgical changes on the left.  There was a benign 1.9 x 1.1 x 1.5 cm cyst in the retroareolar left breast.  Bilateral diagnostic mammogram and ultrasound on 12/17/2017 revealed no evidence of malignancy.   CA27.29 has been followed: 33.7 on 10/21/2014, 26.1 on 04/28/2015, 23.2 on 10/28/2015, 26.9 on 05/08/2016, 28 on 11/15/2016, 24.5 on 05/30/2017, and 25.2 on 12/02/2017.  Bone density study on 10/08/2012 revealed osteopenia (T score -1.8 in left femoral neck).  Bone density study on 10/14/2014 revealed a T-score of -1.7 in the right femur neck.  Bone density study done on 10/17/2016 revealed a T score of -1.6 in the right femoral neck. She is on calcium and vitamin D.  She has been on Boniva x 5 years; stopped around 08/2017.  Symptomatically, she is doing well.  She denies any breast concerns.  Plan: 1.   Review labs from 03/10/2018. 2.   Stage I LEFT breast cancer Clinically, she appears to be doing well. She denies any breast concerns. Mammogram and ultrasound on 12/17/2017 revealed no evidence of malignancy. Schedule bilateral mammogram on 12/19/2018/ 3.   Osteopenia Patient discontinued Boniva around 08/2017. Schedule bone density on 12/19/2018. 4.  RN to contact Dr Parks Ranger office regarding upcoming lab draw and addition of our labs (CBC with diff, CMP, CA27.29). 5.   RTC in 3 months for MD assessment (Doximity or phone), review of labs, and imaging studies.  I discussed the assessment and treatment plan with the patient.  The patient was provided an opportunity to ask questions and all were answered.  The patient agreed with the plan and demonstrated an understanding of the instructions.  The patient was advised to call back or seek an in person evaluation if the  symptoms worsen or if the condition fails to improve as anticipated.  I provided 19 minutes (4:35 PM - 4:54 PM) of non face-to-face telephone visit time during this this encounter and > 50% was spent counseling as documented under my assessment and plan.  I provided these services from the [facility] office.   Lequita Asal, MD, PhD    09/29/2018, 4:35 PM

## 2018-09-29 ENCOUNTER — Inpatient Hospital Stay: Payer: Medicare Other | Attending: Hematology and Oncology | Admitting: Hematology and Oncology

## 2018-09-29 ENCOUNTER — Encounter: Payer: Self-pay | Admitting: Hematology and Oncology

## 2018-09-29 DIAGNOSIS — M85851 Other specified disorders of bone density and structure, right thigh: Secondary | ICD-10-CM

## 2018-09-29 DIAGNOSIS — Z170 Estrogen receptor positive status [ER+]: Secondary | ICD-10-CM

## 2018-09-29 DIAGNOSIS — C50912 Malignant neoplasm of unspecified site of left female breast: Secondary | ICD-10-CM | POA: Diagnosis not present

## 2018-09-29 NOTE — Progress Notes (Signed)
No new changes noted today. The patient Name and DOB has been verified by phone today. 

## 2018-09-30 ENCOUNTER — Other Ambulatory Visit: Payer: Self-pay | Admitting: Hematology and Oncology

## 2018-09-30 DIAGNOSIS — Z1231 Encounter for screening mammogram for malignant neoplasm of breast: Secondary | ICD-10-CM

## 2018-10-02 ENCOUNTER — Other Ambulatory Visit: Payer: Self-pay

## 2018-10-02 ENCOUNTER — Ambulatory Visit (INDEPENDENT_AMBULATORY_CARE_PROVIDER_SITE_OTHER): Payer: Medicare Other

## 2018-10-02 ENCOUNTER — Telehealth: Payer: Self-pay | Admitting: Family Medicine

## 2018-10-02 DIAGNOSIS — Z23 Encounter for immunization: Secondary | ICD-10-CM | POA: Diagnosis not present

## 2018-10-02 DIAGNOSIS — Z853 Personal history of malignant neoplasm of breast: Secondary | ICD-10-CM

## 2018-10-02 NOTE — Telephone Encounter (Signed)
Received a fax from [facility] from Dr Kem Parkinson office.  They requested that patient would like to have her blood tests drawn at our office and forward results to them.  It looks like she is scheduled in November 2020 to see Dr Mike Gip.  She is scheduled to see me earlier in October 2020.  She will need the following labs:  1. CBC with diff 2. CMET 3. CA27.29 (cancer antigen)  Please notify patient to confirm this plan with her that she would like [place] to check these labs. She may schedule a lab only visit 1 week before her apt with me in October. Or she can come her to apt in October and get these labs drawn that day or after that visit.  Nobie Putnam, [facility] 10/02/2018, 11:37 AM

## 2018-10-02 NOTE — Telephone Encounter (Signed)
Patient advised and appt scheduled for lab work on 11/10/18.

## 2018-10-02 NOTE — Progress Notes (Signed)
The patient is here for Flu shot.

## 2018-10-02 NOTE — Telephone Encounter (Signed)
Left message for patient to call back  

## 2018-10-29 ENCOUNTER — Other Ambulatory Visit: Payer: Self-pay | Admitting: Family Medicine

## 2018-10-29 DIAGNOSIS — M159 Polyosteoarthritis, unspecified: Secondary | ICD-10-CM

## 2018-10-29 DIAGNOSIS — G8929 Other chronic pain: Secondary | ICD-10-CM

## 2018-10-29 DIAGNOSIS — M25561 Pain in right knee: Secondary | ICD-10-CM

## 2018-10-29 DIAGNOSIS — M545 Low back pain, unspecified: Secondary | ICD-10-CM

## 2018-11-10 ENCOUNTER — Other Ambulatory Visit: Payer: Medicare Other

## 2018-11-11 ENCOUNTER — Other Ambulatory Visit: Payer: Self-pay

## 2018-11-11 ENCOUNTER — Other Ambulatory Visit: Payer: Medicare Other

## 2018-11-11 DIAGNOSIS — Z853 Personal history of malignant neoplasm of breast: Secondary | ICD-10-CM | POA: Diagnosis not present

## 2018-11-12 LAB — CBC WITH DIFFERENTIAL/PLATELET
Absolute Monocytes: 280 cells/uL (ref 200–950)
Basophils Absolute: 12 cells/uL (ref 0–200)
Basophils Relative: 0.3 %
Eosinophils Absolute: 60 cells/uL (ref 15–500)
Eosinophils Relative: 1.5 %
HCT: 41.3 % (ref 35.0–45.0)
Hemoglobin: 13.6 g/dL (ref 11.7–15.5)
Lymphs Abs: 916 cells/uL (ref 850–3900)
MCH: 30.6 pg (ref 27.0–33.0)
MCHC: 32.9 g/dL (ref 32.0–36.0)
MCV: 93 fL (ref 80.0–100.0)
MPV: 11.3 fL (ref 7.5–12.5)
Monocytes Relative: 7 %
Neutro Abs: 2732 cells/uL (ref 1500–7800)
Neutrophils Relative %: 68.3 %
Platelets: 167 10*3/uL (ref 140–400)
RBC: 4.44 10*6/uL (ref 3.80–5.10)
RDW: 11.8 % (ref 11.0–15.0)
Total Lymphocyte: 22.9 %
WBC: 4 10*3/uL (ref 3.8–10.8)

## 2018-11-12 LAB — COMPLETE METABOLIC PANEL WITH GFR
AG Ratio: 1.7 (calc) (ref 1.0–2.5)
ALT: 11 U/L (ref 6–29)
AST: 14 U/L (ref 10–35)
Albumin: 3.9 g/dL (ref 3.6–5.1)
Alkaline phosphatase (APISO): 72 U/L (ref 37–153)
BUN/Creatinine Ratio: 16 (calc) (ref 6–22)
BUN: 18 mg/dL (ref 7–25)
CO2: 26 mmol/L (ref 20–32)
Calcium: 9.6 mg/dL (ref 8.6–10.4)
Chloride: 107 mmol/L (ref 98–110)
Creat: 1.13 mg/dL — ABNORMAL HIGH (ref 0.60–0.88)
GFR, Est African American: 53 mL/min/{1.73_m2} — ABNORMAL LOW (ref >=60)
GFR, Est Non African American: 46 mL/min/{1.73_m2} — ABNORMAL LOW (ref >=60)
Globulin: 2.3 g/dL (calc) (ref 1.9–3.7)
Glucose, Bld: 94 mg/dL (ref 65–99)
Potassium: 4.3 mmol/L (ref 3.5–5.3)
Sodium: 140 mmol/L (ref 135–146)
Total Bilirubin: 0.8 mg/dL (ref 0.2–1.2)
Total Protein: 6.2 g/dL (ref 6.1–8.1)

## 2018-11-12 LAB — CANCER ANTIGEN 27.29: CA 27.29: 29 U/mL (ref <38)

## 2018-11-18 ENCOUNTER — Ambulatory Visit (INDEPENDENT_AMBULATORY_CARE_PROVIDER_SITE_OTHER): Payer: Medicare Other | Admitting: Family Medicine

## 2018-11-18 ENCOUNTER — Ambulatory Visit (INDEPENDENT_AMBULATORY_CARE_PROVIDER_SITE_OTHER): Payer: Medicare Other

## 2018-11-18 ENCOUNTER — Encounter: Payer: Self-pay | Admitting: Family Medicine

## 2018-11-18 ENCOUNTER — Other Ambulatory Visit: Payer: Self-pay | Admitting: Family Medicine

## 2018-11-18 ENCOUNTER — Other Ambulatory Visit: Payer: Self-pay

## 2018-11-18 VITALS — BP 128/80 | HR 95 | Temp 98.4°F | Resp 16 | Ht 63.0 in | Wt 190.8 lb

## 2018-11-18 VITALS — BP 144/83 | HR 95 | Temp 98.4°F | Ht 63.0 in | Wt 190.8 lb

## 2018-11-18 DIAGNOSIS — I129 Hypertensive chronic kidney disease with stage 1 through stage 4 chronic kidney disease, or unspecified chronic kidney disease: Secondary | ICD-10-CM

## 2018-11-18 DIAGNOSIS — E559 Vitamin D deficiency, unspecified: Secondary | ICD-10-CM

## 2018-11-18 DIAGNOSIS — N1831 Chronic kidney disease, stage 3a: Secondary | ICD-10-CM | POA: Diagnosis not present

## 2018-11-18 DIAGNOSIS — E785 Hyperlipidemia, unspecified: Secondary | ICD-10-CM

## 2018-11-18 DIAGNOSIS — M159 Polyosteoarthritis, unspecified: Secondary | ICD-10-CM

## 2018-11-18 DIAGNOSIS — M25561 Pain in right knee: Secondary | ICD-10-CM | POA: Diagnosis not present

## 2018-11-18 DIAGNOSIS — Z0000 Encounter for general adult medical examination without abnormal findings: Secondary | ICD-10-CM

## 2018-11-18 DIAGNOSIS — R7309 Other abnormal glucose: Secondary | ICD-10-CM

## 2018-11-18 DIAGNOSIS — G8929 Other chronic pain: Secondary | ICD-10-CM

## 2018-11-18 DIAGNOSIS — M8949 Other hypertrophic osteoarthropathy, multiple sites: Secondary | ICD-10-CM

## 2018-11-18 DIAGNOSIS — N183 Chronic kidney disease, stage 3 unspecified: Secondary | ICD-10-CM

## 2018-11-18 DIAGNOSIS — M25562 Pain in left knee: Secondary | ICD-10-CM

## 2018-11-18 DIAGNOSIS — M545 Low back pain: Secondary | ICD-10-CM

## 2018-11-18 MED ORDER — TRAMADOL HCL 50 MG PO TABS: 50.0000 mg | ORAL_TABLET | Freq: Three times a day (TID) | ORAL | 1 refills | Status: DC | PRN

## 2018-11-18 NOTE — Assessment & Plan Note (Signed)
See A&P for osteoarthritis.

## 2018-11-18 NOTE — Assessment & Plan Note (Signed)
Mostly controlled HTN, mild elevated initial, manual repeat improved - Home BP readings normal  Complication with CKD-III OFF BB Metoprolol   Plan:  1. Continue Lisinopril 20mg  daily for now - if elevated BP on home monitor off BB then can increase up to 1.5 pills or previous 10 for a total daily dose of Lisinopril 30mg  daily if needed - notify us if need new rx 2. Encourage improved lifestyle - low sodium diet, regular exercise 3. Continue monitor BP outside office, bring readings to next visit, if persistently >140/90 or new symptoms notify office sooner 4. F/u 6 months  Offered ECHOcardiogram given heart murmur and ultimately we agree to defer for now, she is asymptomatic.

## 2018-11-18 NOTE — Progress Notes (Signed)
Subjective:   Angela Bullock is a 83 y.o. female who presents for Medicare Annual (Subsequent) preventive examination.  Review of Systems:   Cardiac Risk Factors include: advanced age (>43men, >36 women);hypertension;dyslipidemia     Objective:     Vitals: BP (!) 144/83 (BP Location: Right Arm, Patient Position: Sitting, Cuff Size: Normal)   Pulse 95   Temp 98.4 F (36.9 C) (Oral)   Ht 5\' 3"  (1.6 m)   Wt 190 lb 12.8 oz (86.5 kg)   SpO2 100%   BMI 33.80 kg/m   Body mass index is 33.8 kg/m.  Advanced Directives 11/18/2018 09/29/2018 12/02/2017 08/13/2017 07/23/2017 05/30/2017 07/31/2016  Does Patient Have a Medical Advance Directive? Yes No No Yes Yes Yes Yes  Type of Advance Directive Living will;Healthcare Power of [place];Living will [place];Living will Living will;Healthcare Power of [place];Living will  Does patient want to make changes to medical advance directive? - - - - - - -  Copy of [place] in Chart? No - copy requested - - No - copy requested No - copy requested - No - copy requested  Would patient like information on creating a medical advance directive? - No - Patient declined No - Patient declined - - - -    Tobacco Social History   Tobacco Use  Smoking Status Never Smoker  Smokeless Tobacco Never Used     Counseling given: Not Answered   Clinical Intake:  Pre-visit preparation completed: Yes  Pain : No/denies pain     Nutritional Status: BMI > 30  Obese Nutritional Risks: None Diabetes: No     Interpreter Needed?: No  Information entered by :: Saori Umholtz,LPN  Past Medical History:  Diagnosis Date  . Allergy   . Breast cancer ([place]) 01/30/2012   left, radiation and lumpectomy  . Hypertension   . Personal history of radiation therapy    Past Surgical History:  Procedure Laterality Date  . ABDOMINAL HYSTERECTOMY    . BREAST BIOPSY Left  01/30/2011   negative  . BREAST BIOPSY Left 01/30/2012   positive  . BREAST CYST ASPIRATION Left   . BREAST LUMPECTOMY Left 04/02/2012   positive  . COLONOSCOPY WITH PROPOFOL N/A 07/23/2017   Procedure: COLONOSCOPY WITH PROPOFOL WITH BIOPSIES;  Surgeon: Jonathon Bellows, MD;  Location: [facility] ENDOSCOPY;  Service: Gastroenterology;  Laterality: N/A;   Family History  Problem Relation Age of Onset  . Breast cancer Paternal Aunt 85  . Osteoporosis Mother   . Heart attack Father    Social History   Socioeconomic History  . Marital status: Married    Spouse name: Not on file  . Number of children: Not on file  . Years of education: 91  . Highest education level: High school graduate  Occupational History  . Not on file  Social Needs  . Financial resource strain: Not hard at all  . Food insecurity    Worry: Never true    Inability: Never true  . Transportation needs    Medical: No    Non-medical: No  Tobacco Use  . Smoking status: Never Smoker  . Smokeless tobacco: Never Used  Substance and Sexual Activity  . Alcohol use: No    Alcohol/week: 0.0 standard drinks  . Drug use: No  . Sexual activity: Yes  Lifestyle  . Physical activity    Days per week: 0 days    Minutes per session:  0 min  . Stress: Not at all  Relationships  . Social connections    Talks on phone: More than three times a week    Gets together: More than three times a week    Attends religious service: Never    Active member of club or organization: Yes    Attends meetings of clubs or organizations: More than 4 times per year    Relationship status: Married  Other Topics Concern  . Not on file  Social History Narrative  . Not on file    Outpatient Encounter Medications as of 11/18/2018  Medication Sig  . aspirin EC 325 MG tablet Take 325 mg by mouth daily. As needed  . atorvastatin (LIPITOR) 10 MG tablet TAKE 1 TABLET(10 MG) BY MOUTH AT BEDTIME  . baclofen (LIORESAL) 10 MG tablet TAKE 1/2 TO 1 TABLET(5 TO 10  MG) BY MOUTH TWICE DAILY AS NEEDED FOR MUSCLE SPASMS  . Biotin 10 MG CAPS Take 10 mg by mouth daily.  . calcium carbonate (OSCAL) 1500 (600 CA) MG TABS tablet Take 1 tablet by mouth every other day.  . cholecalciferol (VITAMIN D) 1000 UNITS tablet Take 1,000 Units by mouth daily.  Marland Kitchen co-enzyme Q-10 50 MG capsule Take 50 mg by mouth daily.  Marland Kitchen glucosamine-chondroitin 500-400 MG tablet Take 1 tablet by mouth 3 (three) times daily.  Marland Kitchen lisinopril (PRINIVIL,ZESTRIL) 20 MG tablet Take 1 tablet (20 mg total) by mouth daily.  . meloxicam (MOBIC) 15 MG tablet TAKE 1 TABLET BY MOUTH EVERY DAY AS NEEDED FOR PAIN, PREFER TO TAKE UP TO 1- 2 WEEKS AT A TIME THEN STOP  . Multiple Vitamins-Minerals (CENTRUM SILVER PO) Take 1 tablet by mouth daily.  . Omega-3 Krill Oil 300 MG CAPS Take 1 capsule by mouth daily.  . traMADol (ULTRAM) 50 MG tablet Take 1 tablet (50 mg total) by mouth every 8 (eight) hours as needed for moderate pain or severe pain.  . [DISCONTINUED] aspirin EC 81 MG tablet Take 1 tablet (81 mg total) by mouth daily. (Patient not taking: Reported on 09/29/2018)   No facility-administered encounter medications on file as of 11/18/2018.     Activities of Daily Living In your present state of health, do you have any difficulty performing the following activities: 11/18/2018  Hearing? N  Comment no hearing aids  Vision? N  Comment reading glasses, [facility]  Difficulty concentrating or making decisions? Y  Comment comes back  Walking or climbing stairs? N  Dressing or bathing? N  Doing errands, shopping? N  Preparing Food and eating ? N  Using the Toilet? N  In the past six months, have you accidently leaked urine? N  Do you have problems with loss of bowel control? N  Managing your Medications? N  Managing your Finances? N  Housekeeping or managing your Housekeeping? N  Some recent data might be hidden    Patient Care Team: Olin Hauser, DO as PCP - General (Family  Medicine) Lequita Asal, MD as Referring Physician (Hematology and Oncology) Minor, Dalbert Garnet, RN as [place] Management    Assessment:   This is a routine wellness examination for Angela Bullock.  Exercise Activities and Dietary recommendations Current Exercise Habits: The patient does not participate in regular exercise at present, Exercise limited by: None identified  Goals    . DIET - INCREASE WATER INTAKE     Recommend drinking at least 6-8 glasses of water a day     .  Increase water intake     Recommend drinking at least 4-5 glasses of water a day       Fall Risk: Fall Risk  11/18/2018 03/17/2018 09/11/2017 08/13/2017 02/11/2017  Falls in the past year? 1 0 No No No  Number falls in past yr: 0 - - - -  Injury with Fall? 0 - - - -  Follow up - Falls evaluation completed - - -    FALL RISK PREVENTION PERTAINING TO THE HOME:  Any stairs in or around the home? Yes  If so, are there any without handrails? No   Home free of loose throw rugs in walkways, pet beds, electrical cords, etc? Yes  Adequate lighting in your home to reduce risk of falls? Yes   ASSISTIVE DEVICES UTILIZED TO PREVENT FALLS:  Life alert? No  Use of a cane, walker or w/c? No  Grab bars in the bathroom? No  Shower chair or bench in shower? No  Elevated toilet seat or a handicapped toilet? Yes   DME ORDERS:  DME order needed?  No   TIMED UP AND GO:  Was the test performed? Yes .  Length of time to ambulate 10 feet: 11 sec.   GAIT:  Appearance of gait: Gait steady and fast without the use of an assistive device.  Intervention(s) required? No   DME/home health order needed?  No    Depression Screen PHQ 2/9 Scores 11/18/2018 03/17/2018 09/11/2017 08/13/2017  PHQ - 2 Score 0 0 0 0  PHQ- 9 Score - - - -     Cognitive Function     6CIT Screen 11/18/2018 08/13/2017 07/31/2016  What Year? 0 points 0 points 0 points  What month? 0 points 0 points 0 points  What time? 0 points 0  points 0 points  Count back from 20 0 points 0 points 0 points  Months in reverse 0 points 0 points 0 points  Repeat phrase 0 points 0 points 0 points  Total Score 0 0 0    Immunization History  Administered Date(s) Administered  . Fluad Quad(high Dose 65+) 10/02/2018  . Influenza, High Dose Seasonal PF 10/22/2014, 11/07/2017  . Influenza, Seasonal, Injecte, Preservative Fre 10/24/2012  . Influenza-Unspecified 10/24/2015  . Pneumococcal Conjugate-13 01/14/2013  . Tdap 05/21/2013  . Zoster 01/30/2012    Qualifies for Shingles Vaccine? Yes  Zostavax completed 2014. Due for Shingrix. Education has been provided regarding the importance of this vaccine. Pt has been advised to call insurance company to determine out of pocket expense. Advised may also receive vaccine at local pharmacy or Health Dept. Verbalized acceptance and understanding.  Tdap: up to date.  Flu Vaccine: up to date   Pneumococcal Vaccine: up to date.   Screening Tests Health Maintenance  Topic Date Due  . TETANUS/TDAP  05/22/2023  . INFLUENZA VACCINE  Completed  . DEXA SCAN  Completed  . PNA vac Low Risk Adult  Completed    Cancer Screenings:  Colorectal Screening: no longer required   Mammogram: scheduled 12/22/2018  Bone Density: scheduled 12/22/2018  Lung Cancer Screening: (Low Dose CT Chest recommended if Age 84-80 years, 30 pack-year currently smoking OR have quit w/in 15years.) does not qualify.     Additional Screening:  Hepatitis C Screening: does not qualify  Vision Screening: Recommended annual ophthalmology exams for early detection of glaucoma and other disorders of the eye. Is the patient up to date with their annual eye exam?  Yes  Who is the provider or  what is the name of the office in which the pt attends annual eye exams? [facility]   Dental Screening: Recommended annual dental exams for proper oral hygiene  Community Resource Referral:  CRR required this visit?  No        Plan:  I have personally reviewed and addressed the Medicare Annual Wellness questionnaire and have noted the following in the patient's chart:  A. Medical and social history B. Use of alcohol, tobacco or illicit drugs  C. Current medications and supplements D. Functional ability and status E.  Nutritional status F.  Physical activity G. Advance directives H. List of other physicians I.  Hospitalizations, surgeries, and ER visits in previous 12 months J.  [place] such as hearing and vision if needed, cognitive and depression L. Referrals and appointments   In addition, I have reviewed and discussed with patient certain preventive protocols, quality metrics, and best practice recommendations. A written personalized care plan for preventive services as well as general preventive health recommendations were provided to patient.  Signed,    Bevelyn Ngo, LPN  [id number] Nurse Health Advisor   Nurse Notes:none

## 2018-11-18 NOTE — Assessment & Plan Note (Signed)
Stable, chronic CKD-III  - Secondary to age, HTN, NSAIDs likely  Plan: 1. Continue intermittent Meloxicam 1-2 weeks on / off, avoid daily use 2. Improve hydration 3. Control BP F/u Cr trend

## 2018-11-18 NOTE — Patient Instructions (Addendum)
Thank you for coming to the office today.  Keep track of BP - as discussed, if Lisinopril 20mg  is working well, keep on this, if you notice higher reading >150 consistently then we can try adding additional 10mg  for a dose of total 30mg  daily lisinopril, if you need a new rx let me know  Ask pharmacist about Shingrix (new shingles vaccine, 2 dose series - can do anytime from pharmacy.  Please schedule a Follow-up Appointment to: Return in about 6 months (around 05/19/2019) for 6 month for Yearly Medicare Checkup.  If you have any other questions or concerns, please feel free to call the office or send a message through [place]. You may also schedule an earlier appointment if necessary.  Additionally, you may be receiving a survey about your experience at our office within a few days to 1 week by e-mail or mail. We value your feedback.  Nobie Putnam, DO [place]

## 2018-11-18 NOTE — Assessment & Plan Note (Addendum)
Stable chronic OA/DJD multiple joints Without recent flare No recent imaging, would benefit from x-ray for characterizing arthritis  Continue NSAID meloxicam PRN, limit regular use Continue Tramadol - re order today - rarely taking PRN, using appropriately, checked [place] CSRS PMP AWARE 2 yr Future refer to Ortho when ready

## 2018-11-18 NOTE — Patient Instructions (Signed)
Angela Bullock , Thank you for taking time to come for your Medicare Wellness Visit. I appreciate your ongoing commitment to your health goals. Please review the following plan we discussed and let me know if I can assist you in the future.   Screening recommendations/referrals: Colonoscopy: no longer required Mammogram: scheduled 12/22/2018 Bone Density: scheduled 12/22/2018 Recommended yearly ophthalmology/optometry visit for glaucoma screening and checkup Recommended yearly dental visit for hygiene and checkup  Vaccinations: Influenza vaccine: up to date Pneumococcal vaccine: up to date Tdap vaccine: up to date Shingles vaccine: shingrix eligible   Advanced directives: Please bring a copy of your health care power of attorney and living will to the office at your convenience.  Conditions/risks identified: none   Next appointment: Follow up in one year for your annual wellness visit    Preventive Care 65 Years and Older, Female Preventive care refers to lifestyle choices and visits with your health care provider that can promote health and wellness. What does preventive care include?  A yearly physical exam. This is also called an annual well check.  Dental exams once or twice a year.  Routine eye exams. Ask your health care provider how often you should have your eyes checked.  Personal lifestyle choices, including:  Daily care of your teeth and gums.  Regular physical activity.  Eating a healthy diet.  Avoiding tobacco and drug use.  Limiting alcohol use.  Practicing safe sex.  Taking low-dose aspirin every day.  Taking vitamin and mineral supplements as recommended by your health care provider. What happens during an annual well check? The services and screenings done by your health care provider during your annual well check will depend on your age, overall health, lifestyle risk factors, and family history of disease. Counseling  Your health care provider may  ask you questions about your:  Alcohol use.  Tobacco use.  Drug use.  Emotional well-being.  Home and relationship well-being.  Sexual activity.  Eating habits.  History of falls.  Memory and ability to understand (cognition).  Work and work Statistician.  Reproductive health. Screening  You may have the following tests or measurements:  Height, weight, and BMI.  Blood pressure.  Lipid and cholesterol levels. These may be checked every 5 years, or more frequently if you are over 32 years old.  Skin check.  Lung cancer screening. You may have this screening every year starting at age 1 if you have a 30-pack-year history of smoking and currently smoke or have quit within the past 15 years.  Fecal occult blood test (FOBT) of the stool. You may have this test every year starting at age 32.  Flexible sigmoidoscopy or colonoscopy. You may have a sigmoidoscopy every 5 years or a colonoscopy every 10 years starting at age 1.  Hepatitis C blood test.  Hepatitis B blood test.  Sexually transmitted disease (STD) testing.  Diabetes screening. This is done by checking your blood sugar (glucose) after you have not eaten for a while (fasting). You may have this done every 1-3 years.  Bone density scan. This is done to screen for osteoporosis. You may have this done starting at age 92.  Mammogram. This may be done every 1-2 years. Talk to your health care provider about how often you should have regular mammograms. Talk with your health care provider about your test results, treatment options, and if necessary, the need for more tests. Vaccines  Your health care provider may recommend certain vaccines, such as:  Influenza vaccine.  This is recommended every year.  Tetanus, diphtheria, and acellular pertussis (Tdap, Td) vaccine. You may need a Td booster every 10 years.  Zoster vaccine. You may need this after age 62.  Pneumococcal 13-valent conjugate (PCV13) vaccine. One  dose is recommended after age 22.  Pneumococcal polysaccharide (PPSV23) vaccine. One dose is recommended after age 36. Talk to your health care provider about which screenings and vaccines you need and how often you need them. This information is not intended to replace advice given to you by your health care provider. Make sure you discuss any questions you have with your health care provider. Document Released: 02/11/2015 Document Revised: 10/05/2015 Document Reviewed: 11/16/2014 Elsevier Interactive Patient Education  2017 [employer] Prevention in the Home Falls can cause injuries. They can happen to people of all ages. There are many things you can do to make your home safe and to help prevent falls. What can I do on the outside of my home?  Regularly fix the edges of walkways and driveways and fix any cracks.  Remove anything that might make you trip as you walk through a door, such as a raised step or threshold.  Trim any bushes or trees on the path to your home.  Use bright outdoor lighting.  Clear any walking paths of anything that might make someone trip, such as rocks or tools.  Regularly check to see if handrails are loose or broken. Make sure that both sides of any steps have handrails.  Any raised decks and porches should have guardrails on the edges.  Have any leaves, snow, or ice cleared regularly.  Use sand or salt on walking paths during winter.  Clean up any spills in your garage right away. This includes oil or grease spills. What can I do in the bathroom?  Use night lights.  Install grab bars by the toilet and in the tub and shower. Do not use towel bars as grab bars.  Use non-skid mats or decals in the tub or shower.  If you need to sit down in the shower, use a plastic, non-slip stool.  Keep the floor dry. Clean up any water that spills on the floor as soon as it happens.  Remove soap buildup in the tub or shower regularly.  Attach bath  mats securely with double-sided non-slip rug tape.  Do not have throw rugs and other things on the floor that can make you trip. What can I do in the bedroom?  Use night lights.  Make sure that you have a light by your bed that is easy to reach.  Do not use any sheets or blankets that are too big for your bed. They should not hang down onto the floor.  Have a firm chair that has side arms. You can use this for support while you get dressed.  Do not have throw rugs and other things on the floor that can make you trip. What can I do in the kitchen?  Clean up any spills right away.  Avoid walking on wet floors.  Keep items that you use a lot in easy-to-reach places.  If you need to reach something above you, use a strong step stool that has a grab bar.  Keep electrical cords out of the way.  Do not use floor polish or wax that makes floors slippery. If you must use wax, use non-skid floor wax.  Do not have throw rugs and other things on the floor that can make  you trip. What can I do with my stairs?  Do not leave any items on the stairs.  Make sure that there are handrails on both sides of the stairs and use them. Fix handrails that are broken or loose. Make sure that handrails are as long as the stairways.  Check any carpeting to make sure that it is firmly attached to the stairs. Fix any carpet that is loose or worn.  Avoid having throw rugs at the top or bottom of the stairs. If you do have throw rugs, attach them to the floor with carpet tape.  Make sure that you have a light switch at the top of the stairs and the bottom of the stairs. If you do not have them, ask someone to add them for you. What else can I do to help prevent falls?  Wear shoes that:  Do not have high heels.  Have rubber bottoms.  Are comfortable and fit you well.  Are closed at the toe. Do not wear sandals.  If you use a stepladder:  Make sure that it is fully opened. Do not climb a closed  stepladder.  Make sure that both sides of the stepladder are locked into place.  Ask someone to hold it for you, if possible.  Clearly mark and make sure that you can see:  Any grab bars or handrails.  First and last steps.  Where the edge of each step is.  Use tools that help you move around (mobility aids) if they are needed. These include:  Canes.  Walkers.  Scooters.  Crutches.  Turn on the lights when you go into a dark area. Replace any light bulbs as soon as they burn out.  Set up your furniture so you have a clear path. Avoid moving your furniture around.  If any of your floors are uneven, fix them.  If there are any pets around you, be aware of where they are.  Review your medicines with your doctor. Some medicines can make you feel dizzy. This can increase your chance of falling. Ask your doctor what other things that you can do to help prevent falls. This information is not intended to replace advice given to you by your health care provider. Make sure you discuss any questions you have with your health care provider. Document Released: 11/11/2008 Document Revised: 06/23/2015 Document Reviewed: 02/19/2014 Elsevier Interactive Patient Education  2017 [employer].

## 2018-11-18 NOTE — Progress Notes (Signed)
Subjective:    Patient ID: Angela Bullock, female    DOB: [birth year], 83 y.o.   MRN: [id number]  Angela Bullock is a 83 y.o. female presenting on 11/18/2018 for Hypertension and Arthritis   HPI   Patient has already seen [facility] LPN for AMW.  FOLLOW-UP Chronic Pain Multiple Joints / Osteoarthritis Multiple joints (knees, back) - Interval update - she is doing well on tramadol only taking very rarely and sparingly taking 1 pill every 1-3 weeks PRN with good results. - Today doing well, no new concerns. Seems episodic flare. Currently without flare. Some days worse than others, knee pain episodic. - Taking Tramadol rarely as above -TakingMeloxicam 15mg  daily for 1-2 weeks at a time, in few week intervals, doing better while on it - Taking Tylenol PRN -Not established with Orthopedics, she was considering future knee injections. Wants to avoid surgery Denies new injury fall or worsening joint swelling or redness or problem  Hypercalcemia Calcium level previously elevated 10.5, now normalized at 9.6, after she has reduced to every other day for past 6-8 months.  CHRONIC HTN/CKD-III Updates, she has had mostly normal BP 130-140, occasional elevated BP >145-150 in past. She did try to take 30mg  lisinopril with prior 10mg  dose, and did well but then reduced it back to 20mg  and did well. Current Meds -Lisinopril 20mg  daily currently Reports good compliance, took meds today. Tolerating well, w/o complaints. Last lab showed stable elevated Creatinine still with CKD, Cr 1.13, GFR 46 Denies CP, dyspnea, HA, edema, dizziness / lightheadedness   Histor of Breast Cancer Followed by [facility].   Health Maintenance: UTD Flu vaccine already 2020 UTD DEXA,dx Osteopenia on [place] UTD Mammogram surveillance per Oncology    Depression screen [facility] 2/9 11/18/2018 03/17/2018 09/11/2017  Decreased Interest 0 0 0  Down, Depressed, Hopeless 0 0 0  PHQ - 2 Score 0 0 0  Altered  sleeping - - -  Tired, decreased energy - - -  Change in appetite - - -  Feeling bad or failure about yourself  - - -  Trouble concentrating - - -  Moving slowly or fidgety/restless - - -  Suicidal thoughts - - -  PHQ-9 Score - - -  Difficult doing work/chores - - -    Past Medical History:  Diagnosis Date  . Allergy   . Breast cancer ([place]) 01/30/2012   left, radiation and lumpectomy  . Hypertension   . Personal history of radiation therapy    Past Surgical History:  Procedure Laterality Date  . ABDOMINAL HYSTERECTOMY    . BREAST BIOPSY Left 01/30/2011   negative  . BREAST BIOPSY Left 01/30/2012   positive  . BREAST CYST ASPIRATION Left   . BREAST LUMPECTOMY Left 04/02/2012   positive  . COLONOSCOPY WITH PROPOFOL N/A 07/23/2017   Procedure: COLONOSCOPY WITH PROPOFOL WITH BIOPSIES;  Surgeon: Jonathon Bellows, MD;  Location: [facility] ENDOSCOPY;  Service: Gastroenterology;  Laterality: N/A;   Social History   Socioeconomic History  . Marital status: Married    Spouse name: Not on file  . Number of children: Not on file  . Years of education: 40  . Highest education level: High school graduate  Occupational History  . Not on file  Social Needs  . Financial resource strain: Not hard at all  . Food insecurity    Worry: Never true    Inability: Never true  . Transportation needs    Medical: No    Non-medical: No  Tobacco Use  . Smoking status: Never Smoker  . Smokeless tobacco: Never Used  Substance and Sexual Activity  . Alcohol use: No    Alcohol/week: 0.0 standard drinks  . Drug use: No  . Sexual activity: Yes  Lifestyle  . Physical activity    Days per week: 0 days    Minutes per session: 0 min  . Stress: Not at all  Relationships  . Social connections    Talks on phone: More than three times a week    Gets together: More than three times a week    Attends religious service: Never    Active member of club or organization: Yes    Attends meetings of clubs or  organizations: More than 4 times per year    Relationship status: Married  . Intimate partner violence    Fear of current or ex partner: No    Emotionally abused: No    Physically abused: No    Forced sexual activity: No  Other Topics Concern  . Not on file  Social History Narrative  . Not on file   Family History  Problem Relation Age of Onset  . Breast cancer Paternal Aunt 85  . Osteoporosis Mother   . Heart attack Father    Current Outpatient Medications on File Prior to Visit  Medication Sig  . aspirin EC 325 MG tablet Take 325 mg by mouth daily. As needed  . atorvastatin (LIPITOR) 10 MG tablet TAKE 1 TABLET(10 MG) BY MOUTH AT BEDTIME  . baclofen (LIORESAL) 10 MG tablet TAKE 1/2 TO 1 TABLET(5 TO 10 MG) BY MOUTH TWICE DAILY AS NEEDED FOR MUSCLE SPASMS  . Biotin 10 MG CAPS Take 10 mg by mouth daily.  . calcium carbonate (OSCAL) 1500 (600 CA) MG TABS tablet Take 1 tablet by mouth every other day.  . cholecalciferol (VITAMIN D) 1000 UNITS tablet Take 1,000 Units by mouth daily.  Marland Kitchen co-enzyme Q-10 50 MG capsule Take 50 mg by mouth daily.  Marland Kitchen glucosamine-chondroitin 500-400 MG tablet Take 1 tablet by mouth 3 (three) times daily.  Marland Kitchen lisinopril (PRINIVIL,ZESTRIL) 20 MG tablet Take 1 tablet (20 mg total) by mouth daily.  . meloxicam (MOBIC) 15 MG tablet TAKE 1 TABLET BY MOUTH EVERY DAY AS NEEDED FOR PAIN, PREFER TO TAKE UP TO 1- 2 WEEKS AT A TIME THEN STOP  . Multiple Vitamins-Minerals (CENTRUM SILVER PO) Take 1 tablet by mouth daily.  . Omega-3 Krill Oil 300 MG CAPS Take 1 capsule by mouth daily.   No current facility-administered medications on file prior to visit.     Review of Systems Per HPI unless specifically indicated above     Objective:    BP 128/80 (BP Location: Right Arm, Cuff Size: Normal)   Pulse 95   Temp 98.4 F (36.9 C) (Oral)   Resp 16   Ht 5\' 3"  (1.6 m)   Wt 190 lb 12.8 oz (86.5 kg)   SpO2 100%   BMI 33.80 kg/m   Wt Readings from Last 3 Encounters:   11/18/18 190 lb 12.8 oz (86.5 kg)  11/18/18 190 lb 12.8 oz (86.5 kg)  03/17/18 190 lb (86.2 kg)    Physical Exam Vitals signs and nursing note reviewed.  Constitutional:      General: She is not in acute distress.    Appearance: She is well-developed. She is not diaphoretic.     Comments: Well-appearing, comfortable, cooperative  HENT:     Head: Normocephalic and atraumatic.  Eyes:  General:        Right eye: No discharge.        Left eye: No discharge.     Conjunctiva/sclera: Conjunctivae normal.  Cardiovascular:     Rate and Rhythm: Normal rate.  Pulmonary:     Effort: Pulmonary effort is normal.  Skin:    General: Skin is warm and dry.     Findings: No erythema or rash.  Neurological:     Mental Status: She is alert and oriented to person, place, and time.  Psychiatric:        Behavior: Behavior normal.     Comments: Well groomed, good eye contact, normal speech and thoughts    Results for orders placed or performed in visit on 11/11/18  Cancer antigen 27.29  Result Value Ref Range   CA 27.29 29 <38 U/mL  COMPLETE METABOLIC PANEL WITH GFR  Result Value Ref Range   Glucose, Bld 94 65 - 99 mg/dL   BUN 18 7 - 25 mg/dL   Creat 1.13 (H) 0.60 - 0.88 mg/dL   GFR, Est Non African American 46 (L) > OR = 60 mL/min/1.16m2   GFR, Est African American 53 (L) > OR = 60 mL/min/1.61m2   BUN/Creatinine Ratio 16 6 - 22 (calc)   Sodium 140 135 - 146 mmol/L   Potassium 4.3 3.5 - 5.3 mmol/L   Chloride 107 98 - 110 mmol/L   CO2 26 20 - 32 mmol/L   Calcium 9.6 8.6 - 10.4 mg/dL   Total Protein 6.2 6.1 - 8.1 g/dL   Albumin 3.9 3.6 - 5.1 g/dL   Globulin 2.3 1.9 - 3.7 g/dL (calc)   AG Ratio 1.7 1.0 - 2.5 (calc)   Total Bilirubin 0.8 0.2 - 1.2 mg/dL   Alkaline phosphatase (APISO) 72 37 - 153 U/L   AST 14 10 - 35 U/L   ALT 11 6 - 29 U/L  CBC with Differential/Platelet  Result Value Ref Range   WBC 4.0 3.8 - 10.8 Thousand/uL   RBC 4.44 3.80 - 5.10 Million/uL   Hemoglobin 13.6  11.7 - 15.5 g/dL   HCT 41.3 35.0 - 45.0 %   MCV 93.0 80.0 - 100.0 fL   MCH 30.6 27.0 - 33.0 pg   MCHC 32.9 32.0 - 36.0 g/dL   RDW 11.8 11.0 - 15.0 %   Platelets 167 140 - 400 Thousand/uL   MPV 11.3 7.5 - 12.5 fL   Neutro Abs 2,732 1,500 - 7,800 cells/uL   Lymphs Abs 916 850 - 3,900 cells/uL   Absolute Monocytes 280 200 - 950 cells/uL   Eosinophils Absolute 60 15 - 500 cells/uL   Basophils Absolute 12 0 - 200 cells/uL   Neutrophils Relative % 68.3 %   Total Lymphocyte 22.9 %   Monocytes Relative 7.0 %   Eosinophils Relative 1.5 %   Basophils Relative 0.3 %      Assessment & Plan:   Problem List Items Addressed This Visit    Osteoarthritis, multiple sites    Stable chronic OA/DJD multiple joints Without recent flare No recent imaging, would benefit from x-ray for characterizing arthritis  Continue NSAID meloxicam PRN, limit regular use Continue Tramadol - re order today - rarely taking PRN, using appropriately, checked [place] CSRS PMP AWARE 2 yr Future refer to Ortho when ready      Relevant Medications   traMADol (ULTRAM) 50 MG tablet   CKD (chronic kidney disease) stage 3, GFR 30-59 ml/min    Stable, chronic  CKD-III  - Secondary to age, HTN, NSAIDs likely  Plan: 1. Continue intermittent Meloxicam 1-2 weeks on / off, avoid daily use 2. Improve hydration 3. Control BP F/u Cr trend      Chronic low back pain without sciatica    See A&P for osteoarthritis      Relevant Medications   traMADol (ULTRAM) 50 MG tablet   Bilateral chronic knee pain    See A&P for osteoarthritis      Relevant Medications   traMADol (ULTRAM) 50 MG tablet   Benign hypertension with CKD (chronic kidney disease) stage III - Primary    Mostly controlled HTN, mild elevated initial, manual repeat improved - Home BP readings normal  Complication with CKD-III OFF BB Metoprolol   Plan:  1. Continue Lisinopril 20mg  daily for now - if elevated BP on home monitor off BB then can increase up to  1.5 pills or previous 10 for a total daily dose of Lisinopril 30mg  daily if needed - notify us if need new rx 2. Encourage improved lifestyle - low sodium diet, regular exercise 3. Continue monitor BP outside office, bring readings to next visit, if persistently >140/90 or new symptoms notify office sooner 4. F/u 6 months  Offered ECHOcardiogram given heart murmur and ultimately we agree to defer for now, she is asymptomatic.       Other Visit Diagnoses    Hypercalcemia       RESOLVED, on reduced calcium supplement now to every other day.      Meds ordered this encounter  Medications  . traMADol (ULTRAM) 50 MG tablet    Sig: Take 1 tablet (50 mg total) by mouth every 8 (eight) hours as needed for moderate pain or severe pain.    Dispense:  30 tablet    Refill:  1     Follow up plan: Return in about 6 months (around 05/19/2019) for 6 month for Yearly Medicare Checkup.  Future labs 05/12/2019  Nobie Putnam, [facility] 11/18/2018, 11:28 AM

## 2018-12-03 ENCOUNTER — Other Ambulatory Visit: Payer: Self-pay | Admitting: Family Medicine

## 2018-12-03 DIAGNOSIS — I10 Essential (primary) hypertension: Secondary | ICD-10-CM

## 2018-12-22 ENCOUNTER — Other Ambulatory Visit: Payer: Medicare Other

## 2018-12-26 ENCOUNTER — Other Ambulatory Visit: Payer: Medicare Other

## 2018-12-29 ENCOUNTER — Telehealth: Payer: Medicare Other | Admitting: Hematology and Oncology

## 2018-12-29 ENCOUNTER — Other Ambulatory Visit: Payer: Medicare Other

## 2019-04-07 ENCOUNTER — Other Ambulatory Visit: Payer: Self-pay

## 2019-04-07 ENCOUNTER — Ambulatory Visit (INDEPENDENT_AMBULATORY_CARE_PROVIDER_SITE_OTHER): Payer: Medicare Other | Admitting: Podiatry

## 2019-04-07 DIAGNOSIS — M79676 Pain in unspecified toe(s): Secondary | ICD-10-CM

## 2019-04-07 DIAGNOSIS — B351 Tinea unguium: Secondary | ICD-10-CM

## 2019-04-10 NOTE — Progress Notes (Signed)
   SUBJECTIVE Patient presents to office today complaining of elongated, thickened nails that cause pain while ambulating in shoes. She is unable to trim her own nails. Patient is here for further evaluation and treatment.  Past Medical History:  Diagnosis Date  . Allergy   . Breast cancer ([place]) 01/30/2012   left, radiation and lumpectomy  . Hypertension   . Personal history of radiation therapy     OBJECTIVE General Patient is awake, alert, and oriented x 3 and in no acute distress. Derm Skin is dry and supple bilateral. Negative open lesions or macerations. Remaining integument unremarkable. Nails are tender, long, thickened and dystrophic with subungual debris, consistent with onychomycosis, 1-5 bilateral. No signs of infection noted. Vasc  DP and PT pedal pulses palpable bilaterally. Temperature gradient within normal limits.  Neuro Epicritic and protective threshold sensation grossly intact bilaterally.  Musculoskeletal Exam No symptomatic pedal deformities noted bilateral. Muscular strength within normal limits.  ASSESSMENT 1. Onychodystrophic nails 1-5 bilateral with hyperkeratosis of nails.  2. Onychomycosis of nail due to dermatophyte bilateral 3. Pain in foot bilateral  PLAN OF CARE 1. Patient evaluated today.  2. Instructed to maintain good pedal hygiene and foot care.  3. Mechanical debridement of nails 1-5 bilaterally performed using a nail nipper. Filed with dremel without incident.  4. Return to clinic in 3 mos.    Edrick Kins, DPM [facility]  Dr. Edrick Kins, [address]                                        [place], [place] [address]                Office [phone]  Fax [phone]

## 2019-05-12 ENCOUNTER — Other Ambulatory Visit: Payer: Self-pay

## 2019-05-12 ENCOUNTER — Other Ambulatory Visit: Payer: Medicare Other

## 2019-05-12 DIAGNOSIS — I129 Hypertensive chronic kidney disease with stage 1 through stage 4 chronic kidney disease, or unspecified chronic kidney disease: Secondary | ICD-10-CM

## 2019-05-12 DIAGNOSIS — R7309 Other abnormal glucose: Secondary | ICD-10-CM | POA: Diagnosis not present

## 2019-05-12 DIAGNOSIS — N1831 Chronic kidney disease, stage 3a: Secondary | ICD-10-CM

## 2019-05-12 DIAGNOSIS — N183 Chronic kidney disease, stage 3 unspecified: Secondary | ICD-10-CM | POA: Diagnosis not present

## 2019-05-12 DIAGNOSIS — E785 Hyperlipidemia, unspecified: Secondary | ICD-10-CM | POA: Diagnosis not present

## 2019-05-12 DIAGNOSIS — E559 Vitamin D deficiency, unspecified: Secondary | ICD-10-CM | POA: Diagnosis not present

## 2019-05-13 LAB — CBC WITH DIFFERENTIAL/PLATELET
Absolute Monocytes: 284 cells/uL (ref 200–950)
Basophils Absolute: 9 cells/uL (ref 0–200)
Basophils Relative: 0.2 %
Eosinophils Absolute: 90 cells/uL (ref 15–500)
Eosinophils Relative: 2 %
HCT: 40 % (ref 35.0–45.0)
Hemoglobin: 13.3 g/dL (ref 11.7–15.5)
Lymphs Abs: 1044 cells/uL (ref 850–3900)
MCH: 30.9 pg (ref 27.0–33.0)
MCHC: 33.3 g/dL (ref 32.0–36.0)
MCV: 92.8 fL (ref 80.0–100.0)
MPV: 11.4 fL (ref 7.5–12.5)
Monocytes Relative: 6.3 %
Neutro Abs: 3074 cells/uL (ref 1500–7800)
Neutrophils Relative %: 68.3 %
Platelets: 177 10*3/uL (ref 140–400)
RBC: 4.31 10*6/uL (ref 3.80–5.10)
RDW: 12.5 % (ref 11.0–15.0)
Total Lymphocyte: 23.2 %
WBC: 4.5 10*3/uL (ref 3.8–10.8)

## 2019-05-13 LAB — COMPLETE METABOLIC PANEL WITH GFR
AG Ratio: 1.7 (calc) (ref 1.0–2.5)
ALT: 13 U/L (ref 6–29)
AST: 16 U/L (ref 10–35)
Albumin: 4 g/dL (ref 3.6–5.1)
Alkaline phosphatase (APISO): 73 U/L (ref 37–153)
BUN/Creatinine Ratio: 13 (calc) (ref 6–22)
BUN: 15 mg/dL (ref 7–25)
CO2: 25 mmol/L (ref 20–32)
Calcium: 10.1 mg/dL (ref 8.6–10.4)
Chloride: 106 mmol/L (ref 98–110)
Creat: 1.2 mg/dL — ABNORMAL HIGH (ref 0.60–0.88)
GFR, Est African American: 49 mL/min/{1.73_m2} — ABNORMAL LOW (ref >=60)
GFR, Est Non African American: 42 mL/min/{1.73_m2} — ABNORMAL LOW (ref >=60)
Globulin: 2.3 g/dL (calc) (ref 1.9–3.7)
Glucose, Bld: 92 mg/dL (ref 65–99)
Potassium: 4.2 mmol/L (ref 3.5–5.3)
Sodium: 140 mmol/L (ref 135–146)
Total Bilirubin: 0.9 mg/dL (ref 0.2–1.2)
Total Protein: 6.3 g/dL (ref 6.1–8.1)

## 2019-05-13 LAB — LIPID PANEL
Cholesterol: 150 mg/dL (ref <200)
HDL: 61 mg/dL (ref >=50)
LDL Cholesterol (Calc): 70 mg/dL (calc)
Non-HDL Cholesterol (Calc): 89 mg/dL (calc) (ref <130)
Total CHOL/HDL Ratio: 2.5 (calc) (ref <5.0)
Triglycerides: 109 mg/dL (ref <150)

## 2019-05-13 LAB — HEMOGLOBIN A1C
Hgb A1c MFr Bld: 5.5 % of total Hgb (ref <5.7)
Mean Plasma Glucose: 111 (calc)
eAG (mmol/L): 6.2 (calc)

## 2019-05-13 LAB — VITAMIN D 25 HYDROXY (VIT D DEFICIENCY, FRACTURES): Vit D, 25-Hydroxy: 58 ng/mL (ref 30–100)

## 2019-05-13 LAB — TSH: TSH: 2.38 mIU/L (ref 0.40–4.50)

## 2019-05-15 ENCOUNTER — Other Ambulatory Visit: Payer: Self-pay | Admitting: Family Medicine

## 2019-05-15 DIAGNOSIS — E785 Hyperlipidemia, unspecified: Secondary | ICD-10-CM

## 2019-05-21 ENCOUNTER — Ambulatory Visit (INDEPENDENT_AMBULATORY_CARE_PROVIDER_SITE_OTHER): Payer: Medicare Other | Admitting: Family Medicine

## 2019-05-21 ENCOUNTER — Other Ambulatory Visit: Payer: Self-pay

## 2019-05-21 ENCOUNTER — Encounter: Payer: Self-pay | Admitting: Family Medicine

## 2019-05-21 ENCOUNTER — Other Ambulatory Visit: Payer: Self-pay | Admitting: Family Medicine

## 2019-05-21 VITALS — BP 125/55 | HR 71 | Temp 97.1°F | Resp 16 | Ht 63.0 in | Wt 186.0 lb

## 2019-05-21 DIAGNOSIS — Z853 Personal history of malignant neoplasm of breast: Secondary | ICD-10-CM

## 2019-05-21 DIAGNOSIS — E559 Vitamin D deficiency, unspecified: Secondary | ICD-10-CM | POA: Diagnosis not present

## 2019-05-21 DIAGNOSIS — N183 Chronic kidney disease, stage 3 unspecified: Secondary | ICD-10-CM | POA: Diagnosis not present

## 2019-05-21 DIAGNOSIS — M25562 Pain in left knee: Secondary | ICD-10-CM

## 2019-05-21 DIAGNOSIS — N1831 Chronic kidney disease, stage 3a: Secondary | ICD-10-CM | POA: Diagnosis not present

## 2019-05-21 DIAGNOSIS — G8929 Other chronic pain: Secondary | ICD-10-CM | POA: Diagnosis not present

## 2019-05-21 DIAGNOSIS — Z170 Estrogen receptor positive status [ER+]: Secondary | ICD-10-CM

## 2019-05-21 DIAGNOSIS — M25561 Pain in right knee: Secondary | ICD-10-CM

## 2019-05-21 DIAGNOSIS — M159 Polyosteoarthritis, unspecified: Secondary | ICD-10-CM

## 2019-05-21 DIAGNOSIS — C50912 Malignant neoplasm of unspecified site of left female breast: Secondary | ICD-10-CM

## 2019-05-21 DIAGNOSIS — I129 Hypertensive chronic kidney disease with stage 1 through stage 4 chronic kidney disease, or unspecified chronic kidney disease: Secondary | ICD-10-CM

## 2019-05-21 DIAGNOSIS — M150 Primary generalized (osteo)arthritis: Secondary | ICD-10-CM

## 2019-05-21 DIAGNOSIS — M8949 Other hypertrophic osteoarthropathy, multiple sites: Secondary | ICD-10-CM

## 2019-05-21 DIAGNOSIS — E782 Mixed hyperlipidemia: Secondary | ICD-10-CM | POA: Diagnosis not present

## 2019-05-21 NOTE — Assessment & Plan Note (Signed)
Stable, chronic CKD-III  - Secondary to age, HTN, NSAIDs likely  Last lab slightly elevated but within range of baseline Cr  Plan: 1. Continue intermittent Meloxicam 1-2 weeks on / off, avoid daily use 2. Improve hydration 3. Control BP F/u Cr trend

## 2019-05-21 NOTE — Progress Notes (Signed)
Subjective:    Patient ID: Angela Bullock, female    DOB: 04-08-1935, 84 y.o.   MRN: [id number]  AASTHA Bullock is a 84 y.o. female presenting on 05/21/2019 for Hypertension   HPI    FOLLOW-UP Chronic Pain Multiple Joints / Osteoarthritis Multiple joints (knees, back) - Interval update Continues to do well on current regimen, has continued glucosamine-chondroitin and Resveratrol OTC supplement with joint pain relief. - Also still taking tramadol only taking very rarely and sparingly taking 1 pill every 1-3 weeks PRN with good results. - Today doing well, no new concerns. Seems episodic flare. Currently without flare. Some days worse than others, knee pain episodic. Overall improved - Not due for refill on Tramadol yet. -TakingMeloxicam 15mg  daily for 1-2 weeks at a time, in few week intervals, doing better while on it - Taking Tylenol PRN -Not established with Orthopedics. Wants to avoid surgery Denies new injury fall or worsening joint swelling or redness or problem  Hypercalcemia Improved calcium, normal range still  CHRONIC HTN/CKD-III Cr 1.2 - stable from 1.13 to 1.17 previously Improved BP control currently. Home readings Current Meds -Lisinopril20mg  daily currently Reports good compliance, took meds today. Tolerating well, w/o complaints. Last lab showed stable elevated Creatininestill with CKD, Cr 1.13, GFR 46 Denies CP, dyspnea, HA, edema, dizziness / lightheadedness  History ofBreast Cancer = Stage I Left Breast Background history, s/p excisional biopsy 04/02/12. Basal Cell Carcinoma. No DCIS. S/p L partial mastectomy. She had Mammosite radiation. Arimidex 05/2012. Completed 1 year of Arimidex. Has been managed on surveillance on mammograms. CA27.29 surveillance.  - Last visit 08/2018 with [facility] ONcology Dr Mike Gip, now she has moved to [facility]. Patient is preferring to follow with PCP for lab draw only at this time instead of mammogram and other surveillance with  Oncology  History of Heart Murmur No recent ECHO No new changes She has declined testing.  Health Maintenance: UTD COVID19 vaccine. UTD DEXA,dx Osteopenia on [place] UTD Mammogram surveillance per Oncology - she is opting to skip upcoming mammogram   Depression screen [facility] 2/9 05/21/2019 11/18/2018 03/17/2018  Decreased Interest 0 0 0  Down, Depressed, Hopeless 0 0 0  PHQ - 2 Score 0 0 0  Altered sleeping - - -  Tired, decreased energy - - -  Change in appetite - - -  Feeling bad or failure about yourself  - - -  Trouble concentrating - - -  Moving slowly or fidgety/restless - - -  Suicidal thoughts - - -  PHQ-9 Score - - -  Difficult doing work/chores - - -    Past Medical History:  Diagnosis Date  . Allergy   . Breast cancer ([place]) 01/30/2012   left, radiation and lumpectomy  . Hypertension   . Personal history of radiation therapy    Past Surgical History:  Procedure Laterality Date  . ABDOMINAL HYSTERECTOMY    . BREAST BIOPSY Left 01/30/2011   negative  . BREAST BIOPSY Left 01/30/2012   positive  . BREAST CYST ASPIRATION Left   . BREAST LUMPECTOMY Left 04/02/2012   positive  . COLONOSCOPY WITH PROPOFOL N/A 07/23/2017   Procedure: COLONOSCOPY WITH PROPOFOL WITH BIOPSIES;  Surgeon: Jonathon Bellows, MD;  Location: [facility] ENDOSCOPY;  Service: Gastroenterology;  Laterality: N/A;   Social History   Socioeconomic History  . Marital status: Married    Spouse name: Not on file  . Number of children: Not on file  . Years of education: 80  . Highest education level:  High school graduate  Occupational History  . Not on file  Tobacco Use  . Smoking status: Never Smoker  . Smokeless tobacco: Never Used  Substance and Sexual Activity  . Alcohol use: No    Alcohol/week: 0.0 standard drinks  . Drug use: No  . Sexual activity: Yes  Other Topics Concern  . Not on file  Social History Narrative  . Not on file   Social Determinants of Health   Financial Resource Strain:   .  Difficulty of Paying Living Expenses:   Food Insecurity:   . Worried About Charity fundraiser in the Last Year:   . Arboriculturist in the Last Year:   Transportation Needs:   . Film/video editor (Medical):   Marland Kitchen Lack of Transportation (Non-Medical):   Physical Activity:   . Days of Exercise per Week:   . Minutes of Exercise per Session:   Stress:   . Feeling of Stress :   Social Connections:   . Frequency of Communication with Friends and Family:   . Frequency of Social Gatherings with Friends and Family:   . Attends Religious Services:   . Active Member of Clubs or Organizations:   . Attends Archivist Meetings:   Marland Kitchen Marital Status:   Intimate Partner Violence:   . Fear of Current or Ex-Partner:   . Emotionally Abused:   Marland Kitchen Physically Abused:   . Sexually Abused:    Family History  Problem Relation Age of Onset  . Breast cancer Paternal Aunt 85  . Osteoporosis Mother   . Heart attack Father    Current Outpatient Medications on File Prior to Visit  Medication Sig  . aspirin EC 325 MG tablet Take 325 mg by mouth daily. As needed  . atorvastatin (LIPITOR) 10 MG tablet TAKE 1 TABLET(10 MG) BY MOUTH AT BEDTIME  . baclofen (LIORESAL) 10 MG tablet TAKE 1/2 TO 1 TABLET(5 TO 10 MG) BY MOUTH TWICE DAILY AS NEEDED FOR MUSCLE SPASMS  . Biotin 10 MG CAPS Take 10 mg by mouth daily.  . calcium carbonate (OSCAL) 1500 (600 CA) MG TABS tablet Take 1 tablet by mouth every other day.  . Calcium Carbonate-Vitamin D 600-200 MG-UNIT TABS Take by mouth.  . cholecalciferol (VITAMIN D) 1000 UNITS tablet Take 1,000 Units by mouth daily.  Marland Kitchen co-enzyme Q-10 50 MG capsule Take 50 mg by mouth daily.  Marland Kitchen glucosamine-chondroitin 500-400 MG tablet Take 1 tablet by mouth 3 (three) times daily.  Marland Kitchen lisinopril (ZESTRIL) 20 MG tablet Take 1 tablet (20 mg total) by mouth daily.  . meloxicam (MOBIC) 15 MG tablet TAKE 1 TABLET BY MOUTH EVERY DAY AS NEEDED FOR PAIN, PREFER TO TAKE UP TO 1- 2 WEEKS AT A  TIME THEN STOP  . Multiple Vitamins-Minerals (CENTRUM SILVER PO) Take 1 tablet by mouth daily.  . Omega-3 Krill Oil 300 MG CAPS Take 1 capsule by mouth daily.  Marland Kitchen Resveratrol 250 MG CAPS   . traMADol (ULTRAM) 50 MG tablet Take 1 tablet (50 mg total) by mouth every 8 (eight) hours as needed for moderate pain or severe pain.   No current facility-administered medications on file prior to visit.    Review of Systems  Constitutional: Negative for activity change, appetite change, chills, diaphoresis, fatigue and fever.  HENT: Negative for congestion and hearing loss.   Eyes: Negative for visual disturbance.  Respiratory: Negative for cough, chest tightness, shortness of breath and wheezing.   Cardiovascular: Negative for chest  pain, palpitations and leg swelling.  Gastrointestinal: Negative for abdominal pain, constipation, diarrhea, nausea and vomiting.  Genitourinary: Negative for dysuria, frequency and hematuria.  Musculoskeletal: Positive for arthralgias (improved knee pain). Negative for neck pain.  Skin: Negative for rash.  Neurological: Negative for dizziness, weakness, light-headedness, numbness and headaches.  Hematological: Negative for adenopathy.  Psychiatric/Behavioral: Negative for behavioral problems, dysphoric mood and sleep disturbance.   Per HPI unless specifically indicated above      Objective:    BP (!) 125/55   Pulse 71   Temp (!) 97.1 F (36.2 C) (Temporal)   Resp 16   Ht 5\' 3"  (1.6 m)   Wt 186 lb (84.4 kg)   BMI 32.95 kg/m   Wt Readings from Last 3 Encounters:  05/21/19 186 lb (84.4 kg)  11/18/18 190 lb 12.8 oz (86.5 kg)  11/18/18 190 lb 12.8 oz (86.5 kg)    Physical Exam Vitals and nursing note reviewed.  Constitutional:      General: She is not in acute distress.    Appearance: She is well-developed. She is not diaphoretic.     Comments: Well-appearing, comfortable, cooperative  HENT:     Head: Normocephalic and atraumatic.  Eyes:     General:         Right eye: No discharge.        Left eye: No discharge.     Conjunctiva/sclera: Conjunctivae normal.  Neck:     Thyroid: No thyromegaly.     Vascular: Carotid bruit (L carotid, radiation from heart) present.     Comments: No thyromegaly Cardiovascular:     Rate and Rhythm: Normal rate and regular rhythm.     Heart sounds: Murmur (2/6 systolic L-sternal border) present.  Pulmonary:     Effort: Pulmonary effort is normal. No respiratory distress.     Breath sounds: Normal breath sounds. No wheezing or rales.  Musculoskeletal:        General: Normal range of motion.     Cervical back: Normal range of motion and neck supple.  Lymphadenopathy:     Cervical: No cervical adenopathy.  Skin:    General: Skin is warm and dry.     Findings: No erythema or rash.  Neurological:     Mental Status: She is alert and oriented to person, place, and time.  Psychiatric:        Behavior: Behavior normal.     Comments: Well groomed, good eye contact, normal speech and thoughts        Results for orders placed or performed in visit on 05/12/19  VITAMIN D 25 Hydroxy (Vit-D Deficiency, Fractures)  Result Value Ref Range   Vit D, 25-Hydroxy 58 30 - 100 ng/mL  TSH  Result Value Ref Range   TSH 2.38 0.40 - 4.50 mIU/L  Lipid panel  Result Value Ref Range   Cholesterol 150 <200 mg/dL   HDL 61 > OR = 50 mg/dL   Triglycerides 109 <150 mg/dL   LDL Cholesterol (Calc) 70 mg/dL (calc)   Total CHOL/HDL Ratio 2.5 <5.0 (calc)   Non-HDL Cholesterol (Calc) 89 <130 mg/dL (calc)  COMPLETE METABOLIC PANEL WITH GFR  Result Value Ref Range   Glucose, Bld 92 65 - 99 mg/dL   BUN 15 7 - 25 mg/dL   Creat 1.20 (H) 0.60 - 0.88 mg/dL   GFR, Est Non African American 42 (L) > OR = 60 mL/min/1.46m2   GFR, Est African American 49 (L) > OR = 60 mL/min/1.53m2  BUN/Creatinine Ratio 13 6 - 22 (calc)   Sodium 140 135 - 146 mmol/L   Potassium 4.2 3.5 - 5.3 mmol/L   Chloride 106 98 - 110 mmol/L   CO2 25 20 - 32  mmol/L   Calcium 10.1 8.6 - 10.4 mg/dL   Total Protein 6.3 6.1 - 8.1 g/dL   Albumin 4.0 3.6 - 5.1 g/dL   Globulin 2.3 1.9 - 3.7 g/dL (calc)   AG Ratio 1.7 1.0 - 2.5 (calc)   Total Bilirubin 0.9 0.2 - 1.2 mg/dL   Alkaline phosphatase (APISO) 73 37 - 153 U/L   AST 16 10 - 35 U/L   ALT 13 6 - 29 U/L  CBC with Differential/Platelet  Result Value Ref Range   WBC 4.5 3.8 - 10.8 Thousand/uL   RBC 4.31 3.80 - 5.10 Million/uL   Hemoglobin 13.3 11.7 - 15.5 g/dL   HCT 40.0 35.0 - 45.0 %   MCV 92.8 80.0 - 100.0 fL   MCH 30.9 27.0 - 33.0 pg   MCHC 33.3 32.0 - 36.0 g/dL   RDW 12.5 11.0 - 15.0 %   Platelets 177 140 - 400 Thousand/uL   MPV 11.4 7.5 - 12.5 fL   Neutro Abs 3,074 1,500 - 7,800 cells/uL   Lymphs Abs 1,044 850 - 3,900 cells/uL   Absolute Monocytes 284 200 - 950 cells/uL   Eosinophils Absolute 90 15 - 500 cells/uL   Basophils Absolute 9 0 - 200 cells/uL   Neutrophils Relative % 68.3 %   Total Lymphocyte 23.2 %   Monocytes Relative 6.3 %   Eosinophils Relative 2.0 %   Basophils Relative 0.2 %  Hemoglobin A1c  Result Value Ref Range   Hgb A1c MFr Bld 5.5 <5.7 % of total Hgb   Mean Plasma Glucose 111 (calc)   eAG (mmol/L) 6.2 (calc)      Assessment & Plan:   Problem List Items Addressed This Visit    Vitamin D deficiency   Osteoarthritis, multiple sites    Stable chronic OA/DJD multiple joints Without recent flare No recent imaging, would benefit from x-ray for characterizing arthritis  Continue OTC supplements glucosamine/chondroitin and Resveratrol   Continue NSAID meloxicam PRN, limit regular use Continue Tramadol - not ready for re order, will notify us when she is ready - rarely taking it PRN, using appropriately, checked [place] CSRS PMP AWARE 2 yr Future refer to Ortho when ready      Hyperlipidemia    Controlled cholesterol on statin and lifestyle Calculated ASCVD 10 yr risk score >20% primarily d/t age  Plan: 1. Continue current meds - Atorvastatin 10mg  daily  2. Continue ASA 81mg  for primary ASCVD risk reduction 3. Encourage improved lifestyle - low carb/cholesterol, reduce portion size, continue improving regular exercise 4. Follow-up yearly lipids      History of breast cancer    Stable, without recurrence See HPI / overview for background on breast cancer Left sided, s/p partial mastectomy after biopsy excision, radiation, 1 yr hormonal therapy Arimidex  Last lab CA27.29 negative 10/2018 Continues surveillance per Oncology - however now patient requesting to only follow with PCP for lab CA27.29 and other routine surveillance. She would like to defer or decline mammogram and Oncology follow-up  I will forward chart and request to Dr Mike Gip for review with patient's wishes and look for any suggestions in surveillance at this time for this patient. If any new concerns on lab or clinical finding, then would certainly recommend resume Mammogram / return to  Oncology. Patient requested this plan and she understands risk of less surveillance in this situation.      CKD (chronic kidney disease) stage 3, GFR 30-59 ml/min    Stable, chronic CKD-III  - Secondary to age, HTN, NSAIDs likely  Last lab slightly elevated but within range of baseline Cr  Plan: 1. Continue intermittent Meloxicam 1-2 weeks on / off, avoid daily use 2. Improve hydration 3. Control BP F/u Cr trend       Bilateral chronic knee pain   Benign hypertension with CKD (chronic kidney disease) stage III - Primary    Mostly controlled HTN - Home BP readings normal  Complication with CKD-III OFF BB Metoprolol   Plan:  1. Continue Lisinopril 20mg  daily 2. Encourage improved lifestyle - low sodium diet, regular exercise 3. Continue monitor BP outside office, bring readings to next visit, if persistently >140/90 or new symptoms notify office sooner 4. F/u 6 months  Offered ECHOcardiogram given heart murmur and ultimately we agree to defer for now, she is asymptomatic.        Other Visit Diagnoses    Malignant neoplasm of left breast in female, estrogen receptor positive, unspecified site of breast ([place])   (Chronic)        No orders of the defined types were placed in this encounter.     Follow up plan: Return in about 6 months (around 11/20/2019) for 6 month lab and follow-up.   Will forward chart and message Dr Mike Gip about patient requesting to opt out of oncology breast cancer surveillance and mammogram, and prefer to only have PCP checking labs including CA27.29  Nobie Putnam, DO [facility] 05/21/2019, 10:58 AM

## 2019-05-21 NOTE — Patient Instructions (Addendum)
Thank you for coming to the office today.  Will reach out to Dr Mike Gip about future follow-up with blood work from here and if needed we can reach her.  Keep on current treatment plan.  Great lab results.  Call or message if need Tramadol refill. I can authorize that.  NON fasting lab in 6 months.   Please schedule a Follow-up Appointment to: Return in about 6 months (around 11/20/2019) for 6 month lab and follow-up.  If you have any other questions or concerns, please feel free to call the office or send a message through [facility]. You may also schedule an earlier appointment if necessary.  Additionally, you may be receiving a survey about your experience at our office within a few days to 1 week by e-mail or mail. We value your feedback.  Nobie Putnam, DO [place]

## 2019-05-21 NOTE — Assessment & Plan Note (Addendum)
Controlled cholesterol on statin and lifestyle Calculated ASCVD 10 yr risk score >20% primarily d/t age  Plan: 1. Continue current meds - Atorvastatin 10mg  daily 2. Continue ASA 81mg  for primary ASCVD risk reduction 3. Encourage improved lifestyle - low carb/cholesterol, reduce portion size, continue improving regular exercise 4. Follow-up yearly lipids

## 2019-05-21 NOTE — Assessment & Plan Note (Signed)
Mostly controlled HTN - Home BP readings normal  Complication with CKD-III OFF BB Metoprolol   Plan:  1. Continue Lisinopril 20mg  daily 2. Encourage improved lifestyle - low sodium diet, regular exercise 3. Continue monitor BP outside office, bring readings to next visit, if persistently >140/90 or new symptoms notify office sooner 4. F/u 6 months  Offered ECHOcardiogram given heart murmur and ultimately we agree to defer for now, she is asymptomatic.

## 2019-05-21 NOTE — Assessment & Plan Note (Signed)
Stable, without recurrence See HPI / overview for background on breast cancer Left sided, s/p partial mastectomy after biopsy excision, radiation, 1 yr hormonal therapy Arimidex  Last lab CA27.29 negative 10/2018 Continues surveillance per Oncology - however now patient requesting to only follow with PCP for lab CA27.29 and other routine surveillance. She would like to defer or decline mammogram and Oncology follow-up  I will forward chart and request to Dr Mike Gip for review with patient's wishes and look for any suggestions in surveillance at this time for this patient. If any new concerns on lab or clinical finding, then would certainly recommend resume Mammogram / return to Oncology. Patient requested this plan and she understands risk of less surveillance in this situation.

## 2019-05-21 NOTE — Assessment & Plan Note (Addendum)
Stable chronic OA/DJD multiple joints Without recent flare No recent imaging, would benefit from x-ray for characterizing arthritis  Continue OTC supplements glucosamine/chondroitin and Resveratrol   Continue NSAID meloxicam PRN, limit regular use Continue Tramadol - not ready for re order, will notify us when she is ready - rarely taking it PRN, using appropriately, checked [place] CSRS PMP AWARE 2 yr Future refer to Ortho when ready

## 2019-06-15 ENCOUNTER — Telehealth: Payer: Self-pay

## 2019-06-15 NOTE — Telephone Encounter (Signed)
Patient notified and apt is scheduled as Nurse visit for TB test on 06/16/2019 for patient and spouse.

## 2019-06-15 NOTE — Telephone Encounter (Signed)
Copied from [place] [phone]. Topic: General - Call Back - No Documentation >> Jun 12, 2019  2:52 PM Erick Blinks wrote: Best contact [phone] Call back request, pt missed call in regard to her form completion. Please advise

## 2019-06-15 NOTE — Telephone Encounter (Signed)
She will only need nurse visit only for TB skin test and follow-up for reading TB test here - for both her and her husband Angela Bullock.  I have forms for her and Angela Bullock. I will need about a week to complete them and will have to call the facility.  They were already seen by me in April 2021.  Angela Bullock, [facility] 06/15/2019, 12:43 PM

## 2019-06-16 ENCOUNTER — Ambulatory Visit (INDEPENDENT_AMBULATORY_CARE_PROVIDER_SITE_OTHER): Payer: Medicare Other

## 2019-06-16 ENCOUNTER — Other Ambulatory Visit: Payer: Self-pay

## 2019-06-16 DIAGNOSIS — Z111 Encounter for screening for respiratory tuberculosis: Secondary | ICD-10-CM | POA: Diagnosis not present

## 2019-06-17 NOTE — Progress Notes (Signed)
PPD nurse visit.

## 2019-06-18 LAB — TB SKIN TEST
Induration: 0 mm
TB Skin Test: NEGATIVE

## 2019-06-21 ENCOUNTER — Other Ambulatory Visit: Payer: Self-pay

## 2019-06-21 ENCOUNTER — Emergency Department
Admission: EM | Admit: 2019-06-21 | Discharge: 2019-06-22 | Disposition: A | Discharge status: Home / Self Care | Payer: Medicare Other | Attending: Emergency Medicine | Admitting: Emergency Medicine

## 2019-06-21 DIAGNOSIS — N390 Urinary tract infection, site not specified: Secondary | ICD-10-CM

## 2019-06-21 DIAGNOSIS — N23 Unspecified renal colic: Secondary | ICD-10-CM

## 2019-06-21 DIAGNOSIS — N183 Chronic kidney disease, stage 3 unspecified: Secondary | ICD-10-CM | POA: Insufficient documentation

## 2019-06-21 DIAGNOSIS — N132 Hydronephrosis with renal and ureteral calculous obstruction: Secondary | ICD-10-CM | POA: Diagnosis not present

## 2019-06-21 DIAGNOSIS — Z79899 Other long term (current) drug therapy: Secondary | ICD-10-CM | POA: Insufficient documentation

## 2019-06-21 DIAGNOSIS — K8020 Calculus of gallbladder without cholecystitis without obstruction: Secondary | ICD-10-CM | POA: Diagnosis not present

## 2019-06-21 DIAGNOSIS — R1031 Right lower quadrant pain: Secondary | ICD-10-CM

## 2019-06-21 DIAGNOSIS — N201 Calculus of ureter: Secondary | ICD-10-CM | POA: Diagnosis not present

## 2019-06-21 DIAGNOSIS — I1310 Hypertensive heart and chronic kidney disease without heart failure, with stage 1 through stage 4 chronic kidney disease, or unspecified chronic kidney disease: Secondary | ICD-10-CM | POA: Insufficient documentation

## 2019-06-21 DIAGNOSIS — K769 Liver disease, unspecified: Secondary | ICD-10-CM

## 2019-06-21 LAB — COMPREHENSIVE METABOLIC PANEL
ALT: 16 U/L (ref 0–44)
AST: 24 U/L (ref 15–41)
Albumin: 4.1 g/dL (ref 3.5–5.0)
Alkaline Phosphatase: 85 U/L (ref 38–126)
Anion gap: 13 (ref 5–15)
BUN: 24 mg/dL — ABNORMAL HIGH (ref 8–23)
CO2: 18 mmol/L — ABNORMAL LOW (ref 22–32)
Calcium: 9.9 mg/dL (ref 8.9–10.3)
Chloride: 105 mmol/L (ref 98–111)
Creatinine, Ser: 1.44 mg/dL — ABNORMAL HIGH (ref 0.44–1.00)
GFR calc Af Amer: 39 mL/min — ABNORMAL LOW (ref >60)
GFR calc non Af Amer: 34 mL/min — ABNORMAL LOW (ref >60)
Glucose, Bld: 128 mg/dL — ABNORMAL HIGH (ref 70–99)
Potassium: 4.5 mmol/L (ref 3.5–5.1)
Sodium: 136 mmol/L (ref 135–145)
Total Bilirubin: 1.2 mg/dL (ref 0.3–1.2)
Total Protein: 7.3 g/dL (ref 6.5–8.1)

## 2019-06-21 LAB — LIPASE, BLOOD: Lipase: 34 U/L (ref 11–51)

## 2019-06-21 MED ORDER — SODIUM CHLORIDE 0.9 % IV BOLUS
1000.0000 mL | Freq: Once | INTRAVENOUS | Status: AC
  Administered 2019-06-22: 1000 mL via INTRAVENOUS

## 2019-06-21 MED ORDER — MORPHINE SULFATE (PF) 2 MG/ML IV SOLN
2.0000 mg | Freq: Once | INTRAVENOUS | Status: AC
  Administered 2019-06-22: 2 mg via INTRAVENOUS
  Filled 2019-06-21: qty 1

## 2019-06-21 MED ORDER — IOHEXOL 9 MG/ML PO SOLN: 500.0000 mL | ORAL | Status: AC

## 2019-06-21 MED ORDER — ONDANSETRON HCL 4 MG/2ML IJ SOLN
4.0000 mg | Freq: Once | INTRAMUSCULAR | Status: AC
  Administered 2019-06-22: 4 mg via INTRAVENOUS
  Filled 2019-06-21: qty 2

## 2019-06-21 NOTE — ED Triage Notes (Signed)
Pt arrived to ed via pov. Pt c/o RLQ abd pain starting today around 1430. Pt denies any NVD.  Pt report loose stools over the last month around 2 times a day. Pt reports no BM today. NAD noted at this time.

## 2019-06-21 NOTE — ED Provider Notes (Signed)
[facility] Emergency Department Provider Note   ____________________________________________   First MD Initiated Contact with Patient 06/21/19 2324     (approximate)  I have reviewed the triage vital signs and the nursing notes.   HISTORY  Chief Complaint Abdominal Pain    HPI Angela Bullock is a 84 y.o. female who presents to the ED from home with a chief complaint of abdominal pain.  Patient reports gradual onset right lower quadrant abdominal pain approximately 2:30 PM.  Symptoms associated with lack of appetite.  Patient also reports she has been having loose stools over the last month, approximately twice daily.  No recent history of antibiotic use.  Has a history of loose stools with normal colonoscopy several years ago.  Denies fever, chills, cough, chest pain, shortness of breath, nausea, vomiting or dysuria. Denies recent travel or trauma.       Past Medical History:  Diagnosis Date  . Allergy   . Breast cancer ([place]) 01/30/2012   left, radiation and lumpectomy  . Hypertension   . Personal history of radiation therapy     Patient Active Problem List   Diagnosis Date Noted  . Needs flu shot 10/02/2018  . Heart murmur 09/11/2017  . Goals of care, counseling/discussion 05/30/2017  . Hyperlipidemia 02/11/2017  . Osteopenia 05/08/2016  . Osteoarthritis, multiple sites 11/08/2015  . Bilateral chronic knee pain 11/08/2015  . Chronic low back pain without sciatica 11/08/2015  . Benign hypertension with CKD (chronic kidney disease) stage III 11/08/2015  . CKD (chronic kidney disease) stage 3, GFR 30-59 ml/min 11/08/2015  . History of breast cancer 11/08/2015  . Benign neoplasm of colon 04/14/2010  . Calculus of gallbladder 04/14/2010  . Vitamin D deficiency 04/14/2010    Past Surgical History:  Procedure Laterality Date  . ABDOMINAL HYSTERECTOMY    . BREAST BIOPSY Left 01/30/2011   negative  . BREAST BIOPSY Left 01/30/2012   positive  .  BREAST CYST ASPIRATION Left   . BREAST LUMPECTOMY Left 04/02/2012   positive  . COLONOSCOPY WITH PROPOFOL N/A 07/23/2017   Procedure: COLONOSCOPY WITH PROPOFOL WITH BIOPSIES;  Surgeon: Jonathon Bellows, MD;  Location[facility] ENDOSCOPY;  Service: Gastroenterology;  Laterality: N/A;    Prior to Admission medications   Medication Sig Start Date End Date Taking? Authorizing Provider  aspirin EC 325 MG tablet Take 325 mg by mouth daily. As needed    [provider]  atorvastatin (LIPITOR) 10 MG tablet TAKE 1 TABLET(10 MG) BY MOUTH AT BEDTIME 05/15/19   Karamalegos, Alexander J, DO  baclofen (LIORESAL) 10 MG tablet TAKE 1/2 TO 1 TABLET(5 TO 10 MG) BY MOUTH TWICE DAILY AS NEEDED FOR MUSCLE SPASMS 06/02/18   Karamalegos, Devonne Doughty, DO  Biotin 10 MG CAPS Take 10 mg by mouth daily.    [provider]  calcium carbonate (OSCAL) 1500 (600 CA) MG TABS tablet Take 1 tablet by mouth every other day.    [provider]  Calcium Carbonate-Vitamin D 600-200 MG-UNIT TABS Take by mouth.    [provider]  cholecalciferol (VITAMIN D) 1000 UNITS tablet Take 1,000 Units by mouth daily.    [provider]  co-enzyme Q-10 50 MG capsule Take 50 mg by mouth daily.    [provider]  glucosamine-chondroitin 500-400 MG tablet Take 1 tablet by mouth 3 (three) times daily.    [provider]  HYDROcodone-acetaminophen (NORCO) 5-325 MG tablet Take 1 tablet by mouth every 6 (six) hours as needed  for moderate pain. 06/22/19   Paulette Blanch, MD  lisinopril (ZESTRIL) 20 MG tablet Take 1 tablet (20 mg total) by mouth daily. 12/03/18   Karamalegos, Devonne Doughty, DO  meloxicam (MOBIC) 15 MG tablet TAKE 1 TABLET BY MOUTH EVERY DAY AS NEEDED FOR PAIN, PREFER TO TAKE UP TO 1- 2 WEEKS AT A TIME THEN STOP 06/23/18   Karamalegos, Devonne Doughty, DO  Multiple Vitamins-Minerals (CENTRUM SILVER PO) Take 1 tablet by mouth daily.    [provider]  Omega-3 Krill Oil 300 MG CAPS Take 1  capsule by mouth daily.    [provider]  ondansetron (ZOFRAN ODT) 4 MG disintegrating tablet Take 1 tablet (4 mg total) by mouth every 8 (eight) hours as needed for nausea or vomiting. 06/22/19   Paulette Blanch, MD  Resveratrol 250 MG CAPS  05/20/99   [provider]  tamsulosin (FLOMAX) 0.4 MG CAPS capsule Take 1 capsule (0.4 mg total) by mouth daily. 06/22/19   Paulette Blanch, MD  traMADol (ULTRAM) 50 MG tablet Take 1 tablet (50 mg total) by mouth every 8 (eight) hours as needed for moderate pain or severe pain. 11/18/18   Olin Hauser, DO    Allergies Penicillins  Family History  Problem Relation Age of Onset  . Breast cancer Paternal Aunt 85  . Osteoporosis Mother   . Heart attack Father     Social History Social History   Tobacco Use  . Smoking status: Never Smoker  . Smokeless tobacco: Never Used  Substance Use Topics  . Alcohol use: No    Alcohol/week: 0.0 standard drinks  . Drug use: No    Review of Systems  Constitutional: No fever/chills Eyes: No visual changes. ENT: No sore throat. Cardiovascular: Denies chest pain. Respiratory: Denies shortness of breath. Gastrointestinal: Positive for abdominal pain.  No nausea, no vomiting.  Positive for loose stools.  No constipation. Genitourinary: Negative for dysuria. Musculoskeletal: Negative for back pain. Skin: Negative for rash. Neurological: Negative for headaches, focal weakness or numbness.   ____________________________________________   PHYSICAL EXAM:  VITAL SIGNS: ED Triage Vitals  Enc Vitals Group     BP 06/21/19 1837 (!) 161/80     Pulse Rate 06/21/19 1837 (!) 110     Resp 06/21/19 1837 20     Temp 06/21/19 1837 98.7 F (37.1 C)     Temp Source 06/21/19 1837 Oral     SpO2 06/21/19 1837 100 %     Weight 06/21/19 1838 180 lb (81.6 kg)     Height 06/21/19 1838 5\' 5"  (1.651 m)     Head Circumference --      Peak Flow --      Pain Score 06/21/19 1845 7     Pain Loc --       Pain Edu? --      Excl. in [place]? --     Constitutional: Alert and oriented. Well appearing and in no acute distress. Eyes: Conjunctivae are normal. PERRL. EOMI. Head: Atraumatic. Nose: No congestion/rhinnorhea. Mouth/Throat: Mucous membranes are moist.  Oropharynx non-erythematous. Neck: No stridor.   Cardiovascular: Normal rate, regular rhythm. Grossly normal heart sounds.  Good peripheral circulation. Respiratory: Normal respiratory effort.  No retractions. Lungs CTAB. Gastrointestinal: Soft with minimal tenderness to right lower quadrant without rebound or guarding. No distention. No abdominal bruits. No CVA tenderness. Musculoskeletal: No lower extremity tenderness nor edema.  No joint effusions. Neurologic:  Normal speech and language. No gross focal neurologic deficits are  appreciated. No gait instability. Skin:  Skin is warm, dry and intact. No rash noted. Psychiatric: Mood and affect are normal. Speech and behavior are normal.  ____________________________________________   LABS (all labs ordered are listed, but only abnormal results are displayed)  Labs Reviewed  COMPREHENSIVE METABOLIC PANEL - Abnormal; Notable for the following components:      Result Value   CO2 18 (*)    Glucose, Bld 128 (*)    BUN 24 (*)    Creatinine, Ser 1.44 (*)    GFR calc non Af Amer 34 (*)    GFR calc Af Amer 39 (*)    All other components within normal limits  URINALYSIS, COMPLETE (UACMP) WITH MICROSCOPIC - Abnormal; Notable for the following components:   Color, Urine STRAW (*)    APPearance CLEAR (*)    Specific Gravity, Urine 1.002 (*)    Hgb urine dipstick LARGE (*)    Leukocytes,Ua SMALL (*)    Bacteria, UA RARE (*)    All other components within normal limits  LIPASE, BLOOD  CBC WITH DIFFERENTIAL/PLATELET   ____________________________________________  EKG  None ____________________________________________  RADIOLOGY  ED MD interpretation: CT demonstrates 3 mm distal  right ureteral calculus, liver lesion, cholelithiasis, hiatal hernia, diverticulosis  Official radiology report(s): CT Abdomen Pelvis Wo Contrast  Result Date: 06/22/2019 CLINICAL DATA:  Right lower quadrant abdominal pain EXAM: CT ABDOMEN AND PELVIS WITHOUT CONTRAST TECHNIQUE: Multidetector CT imaging of the abdomen and pelvis was performed following the standard protocol without IV contrast. COMPARISON:  Radiograph 05/09/2017 FINDINGS: Lower chest: Bandlike areas of opacity in the lung bases likely reflect atelectasis and/or scarring. Small punctate calcified granuloma in the right middle lobe (2/2). There is dense calcification of the mitral annulus and likely calcifications on the aortic leaflets. Normal cardiac size. No pericardial effusion. Hepatobiliary: There is a 4.4 x 2.4 cm ovoid lesion arising from the anterior left lobe liver, segment 3 (2/21). This is incompletely characterized on this exam. No other concerning liver lesions. Smooth liver surface contour. Normal attenuation of the liver. There is a calcified gallstone within the largely collapsed gallbladder. No pericholecystic fluid or inflammation. No biliary ductal dilatation or calcified intraductal gallstones. Pancreas: Partial fatty replacement. No pancreatic ductal dilatation or surrounding inflammatory changes. Spleen: Normal in size without focal abnormality. Adrenals/Urinary Tract: Normal adrenal glands. Asymmetric right perinephric and periureteral stranding with moderate hydroureteronephrosis to the level of a 3 mm calculus in the distal right ureter just proximal to the ureterovesicular junction (2/66). Small fluid attenuation cysts are present in both kidneys, largest in the left measuring up to 4 cm (2/28). No concerning renal lesion. Urinary bladder is largely decompressed at the time of exam and therefore poorly evaluated by CT imaging. No other gross bladder abnormality is seen. Stomach/Bowel: Large hiatal hernia without evidence  of obstruction given passage of contrast beyond this level. Distal stomach duodenum and small bowel are unremarkable. Contrast traverses to the level of the cecum. Question postsurgical changes along the cecum which may reflect prior appendectomy, correlate with surgical history. No colonic dilatation or wall thickening. Scattered colonic diverticula without focal pericolonic inflammation to suggest diverticulitis. Vascular/Lymphatic: Atherosclerotic calcifications throughout the abdominal aorta and branch vessels. No aneurysm or ectasia. No enlarged abdominopelvic lymph nodes. Reproductive: Uterus is surgically absent. No concerning adnexal lesions. Other: Small fat containing umbilical hernia. No abdominopelvic free fluid or free gas. No bowel containing hernias. Musculoskeletal: No acute osseous abnormality or suspicious osseous lesion. Multilevel degenerative changes are present in the  imaged portions of the spine. Levocurvature of the thoracolumbar junction, dextrocurvature of the lumbar spine. Mild lateral listhesis L4 on L5 with associated discogenic and facet degenerative changes at this level. No spondylolysis is evident. Additional degenerative changes in the hips and pelvis. IMPRESSION: 1. Asymmetric right perinephric and periureteral stranding with moderate hydroureteronephrosis to the level of a 3 mm calculus in the distal right ureter just proximal to the ureterovesicular junction. 2. There is a 4.4 x 2.4 cm ovoid lesion arising from the anterior left lobe liver, segment 3 (2/21). This is incompletely characterized on this exam. Recommend further characterization with multiphase liver MRI. 3. Cholelithiasis without evidence of acute cholecystitis. 4. Large hiatal hernia without evidence of obstruction. 5. Colonic diverticulosis without evidence of diverticulitis. 6. Aortic Atherosclerosis (ICD10-I70.0). Electronically Signed   By: Lovena Le M.D.   On: 06/22/2019 02:22     ____________________________________________   PROCEDURES  Procedure(s) performed (including Critical Care):  Procedures   ____________________________________________   INITIAL IMPRESSION / ASSESSMENT AND PLAN / ED COURSE  As part of my medical decision making, I reviewed the following data within the [place] notes reviewed and incorporated, Labs reviewed, Old chart reviewed, Radiograph reviewed and Notes from prior ED visits     Angela Bullock was evaluated in Emergency Department on 06/22/2019 for the symptoms described in the history of present illness. She was evaluated in the context of the global COVID-19 pandemic, which necessitated consideration that the patient might be at risk for infection with the SARS-CoV-2 virus that causes COVID-19. Institutional protocols and algorithms that pertain to the evaluation of patients at risk for COVID-19 are in a state of rapid change based on information released by regulatory bodies including the CDC and federal and state organizations. These policies and algorithms were followed during the patient's care in the ED.    84 year old female presenting with right lower quadrant abdominal pain. Differential diagnosis includes, but is not limited to, ovarian cyst, ovarian torsion, acute appendicitis, diverticulitis, urinary tract infection/pyelonephritis, endometriosis, bowel obstruction, colitis, renal colic, gastroenteritis, hernia, fibroids, endometriosis, pregnancy related pain including ectopic pregnancy, etc.  Laboratory results thus far demonstrate mild AKI.  Awaiting CBC and UA.  Will initiate IV hydration, IV morphine for pain paired with IV Zofran for nausea and proceed with CT abdomen/pelvis to evaluate for intra-abdominal etiology of patient's symptoms.   Clinical Course as of Jun 21 241  Mon Jun 22, 2019  0142 Patient resting in no acute distress.  Still drinking oral contrast for CT scan.   [JS]  0239  Updated patient on CT imaging results. Will discharge home on Flomax, analgesia and patient will follow up with urology. Fosfomycin given prior to discharge.   [JS]    Clinical Course User Index [JS] Paulette Blanch, MD     ____________________________________________   FINAL CLINICAL IMPRESSION(S) / ED DIAGNOSES  Final diagnoses:  Right lower quadrant abdominal pain  Lower urinary tract infectious disease  Ureteral colic  Calculus of gallbladder without cholecystitis without obstruction  Liver lesion     ED Discharge Orders         Ordered    tamsulosin (FLOMAX) 0.4 MG CAPS capsule  Daily     06/22/19 0240    HYDROcodone-acetaminophen (NORCO) 5-325 MG tablet  Every 6 hours PRN     06/22/19 0240    ondansetron (ZOFRAN ODT) 4 MG disintegrating tablet  Every 8 hours PRN     06/22/19 0240  Note:  This document was prepared using Dragon voice recognition software and may include unintentional dictation errors.   Paulette Blanch, MD 06/22/19 [phone]

## 2019-06-22 ENCOUNTER — Emergency Department: Payer: Medicare Other

## 2019-06-22 DIAGNOSIS — K8020 Calculus of gallbladder without cholecystitis without obstruction: Secondary | ICD-10-CM | POA: Diagnosis not present

## 2019-06-22 DIAGNOSIS — N132 Hydronephrosis with renal and ureteral calculous obstruction: Secondary | ICD-10-CM | POA: Diagnosis not present

## 2019-06-22 DIAGNOSIS — N390 Urinary tract infection, site not specified: Secondary | ICD-10-CM | POA: Diagnosis not present

## 2019-06-22 LAB — URINALYSIS, COMPLETE (UACMP) WITH MICROSCOPIC
Bilirubin Urine: NEGATIVE
Glucose, UA: NEGATIVE mg/dL
Ketones, ur: NEGATIVE mg/dL
Nitrite: NEGATIVE
Protein, ur: NEGATIVE mg/dL
Specific Gravity, Urine: 1.002 — ABNORMAL LOW (ref 1.005–1.030)
pH: 6 (ref 5.0–8.0)

## 2019-06-22 LAB — CBC WITH DIFFERENTIAL/PLATELET
Abs Immature Granulocytes: 0.04 10*3/uL (ref 0.00–0.07)
Basophils Absolute: 0 10*3/uL (ref 0.0–0.1)
Basophils Relative: 0 %
Eosinophils Absolute: 0 10*3/uL (ref 0.0–0.5)
Eosinophils Relative: 0 %
HCT: 41 % (ref 36.0–46.0)
Hemoglobin: 14.1 g/dL (ref 12.0–15.0)
Immature Granulocytes: 1 %
Lymphocytes Relative: 9 %
Lymphs Abs: 0.7 10*3/uL (ref 0.7–4.0)
MCH: 31 pg (ref 26.0–34.0)
MCHC: 34.4 g/dL (ref 30.0–36.0)
MCV: 90.1 fL (ref 80.0–100.0)
Monocytes Absolute: 0.4 10*3/uL (ref 0.1–1.0)
Monocytes Relative: 4 %
Neutro Abs: 6.8 10*3/uL (ref 1.7–7.7)
Neutrophils Relative %: 86 %
Platelets: 192 10*3/uL (ref 150–400)
RBC: 4.55 MIL/uL (ref 3.87–5.11)
RDW: 12.2 % (ref 11.5–15.5)
WBC: 7.9 10*3/uL (ref 4.0–10.5)
nRBC: 0 % (ref 0.0–0.2)

## 2019-06-22 MED ORDER — FOSFOMYCIN TROMETHAMINE 3 G PO PACK
3.0000 g | PACK | Freq: Once | ORAL | Status: AC
  Administered 2019-06-22: 3 g via ORAL
  Filled 2019-06-22: qty 3

## 2019-06-22 MED ORDER — TAMSULOSIN HCL 0.4 MG PO CAPS
0.4000 mg | ORAL_CAPSULE | Freq: Every day | ORAL | 0 refills | Status: DC
Start: 2019-06-22 — End: 2019-08-10

## 2019-06-22 MED ORDER — HYDROCODONE-ACETAMINOPHEN 5-325 MG PO TABS: 1.0000 | ORAL_TABLET | Freq: Four times a day (QID) | ORAL | 0 refills | Status: DC | PRN

## 2019-06-22 MED ORDER — ONDANSETRON 4 MG PO TBDP
4.0000 mg | ORAL_TABLET | Freq: Three times a day (TID) | ORAL | 0 refills | Status: DC | PRN
Start: 2019-06-22 — End: 2019-06-30

## 2019-06-22 NOTE — Discharge Instructions (Signed)
1. Take pain & nausea medicines as needed (Percocet/Zofran #20). Make sure to take a stool softener while taking narcotic pain medicines. 2. Take Flomax 0.4mg  daily x 14 days. 3. Drink plenty of bottled or filtered water daily. 4. Follow-up with your doctor regarding your liver lesion. They may want to follow this with a MRI. 5. Return to the ER for worsening symptoms, persistent vomiting, fever, difficulty breathing or other concerns.

## 2019-06-27 ENCOUNTER — Other Ambulatory Visit: Payer: Self-pay | Admitting: Family Medicine

## 2019-06-27 DIAGNOSIS — E785 Hyperlipidemia, unspecified: Secondary | ICD-10-CM

## 2019-06-27 DIAGNOSIS — M25562 Pain in left knee: Secondary | ICD-10-CM

## 2019-06-27 DIAGNOSIS — M159 Polyosteoarthritis, unspecified: Secondary | ICD-10-CM

## 2019-06-27 NOTE — Telephone Encounter (Signed)
Requested Prescriptions  Pending Prescriptions Disp Refills  . meloxicam (MOBIC) 15 MG tablet [Pharmacy Med Name: MELOXICAM 15MG  TABLETS] 30 tablet     Sig: TAKE 1 TABLET BY MOUTH EVERY DAY AS NEEDED FOR PAIN. PREFERRED TO TAKE UP TO 1-2 WEEKS AT A TIME THEN STOP     Analgesics:  COX2 Inhibitors Failed - 06/27/2019  4:10 PM      Failed - Cr in normal range and within 360 days    Creat  Date Value Ref Range Status  05/12/2019 1.20 (H) 0.60 - 0.88 mg/dL Final    Comment:    For patients >74 years of age, the reference limit for Creatinine is approximately 13% higher for people identified as African-American. .    Creatinine, Ser  Date Value Ref Range Status  06/21/2019 1.44 (H) 0.44 - 1.00 mg/dL Final         Passed - HGB in normal range and within 360 days    Hemoglobin  Date Value Ref Range Status  06/21/2019 14.1 12.0 - 15.0 g/dL Final   HGB  Date Value Ref Range Status  10/13/2013 13.7 12.0 - 16.0 g/dL Final         Passed - Patient is not pregnant      Passed - Valid encounter within last 12 months    Recent Outpatient Visits          1 month ago Benign hypertension with CKD (chronic kidney disease) stage III   [place], DO   7 months ago Benign hypertension with CKD (chronic kidney disease) stage III   [place], Devonne Doughty, DO   1 year ago Essential hypertension   [facility] Olin Hauser, DO   1 year ago Essential hypertension   [place], Devonne Doughty, DO   2 years ago Change in bowel function   [place], Devonne Doughty, DO      Future Appointments            In 3 days [place], Devonne Doughty, [facility], [place]   In 4 months [place], Devonne Doughty, [facility], [place]           . atorvastatin (LIPITOR) 10 MG tablet [Pharmacy Med Name: ATORVASTATIN 10MG   TABLETS] 90 tablet 2    Sig: TAKE 1 TABLET(10 MG) BY MOUTH AT BEDTIME     Cardiovascular:  Antilipid - Statins Passed - 06/27/2019  4:10 PM      Passed - Total Cholesterol in normal range and within 360 days    Cholesterol  Date Value Ref Range Status  05/12/2019 150 <200 mg/dL Final         Passed - LDL in normal range and within 360 days    LDL Cholesterol (Calc)  Date Value Ref Range Status  05/12/2019 70 mg/dL (calc) Final    Comment:    Reference range: <100 . Desirable range <100 mg/dL for primary prevention;   <70 mg/dL for patients with CHD or diabetic patients  with > or = 2 CHD risk factors. Marland Kitchen LDL-C is now calculated using the Martin-Hopkins  calculation, which is a validated novel method providing  better accuracy than the Friedewald equation in the  estimation of LDL-C.  Cresenciano Genre et al. Annamaria Helling. [id number]): 2061-2068  (http://education.[employer].com/faq/FAQ164)          Passed - HDL in normal range and within  360 days    HDL  Date Value Ref Range Status  05/12/2019 61 > OR = 50 mg/dL Final         Passed - Triglycerides in normal range and within 360 days    Triglycerides  Date Value Ref Range Status  05/12/2019 109 <150 mg/dL Final         Passed - Patient is not pregnant      Passed - Valid encounter within last 12 months    Recent Outpatient Visits          1 month ago Benign hypertension with CKD (chronic kidney disease) stage III   [place], DO   7 months ago Benign hypertension with CKD (chronic kidney disease) stage III   [place], Devonne Doughty, DO   1 year ago Essential hypertension   [facility] Olin Hauser, DO   1 year ago Essential hypertension   [place], Devonne Doughty, DO   2 years ago Change in bowel function   [place], Devonne Doughty, DO      Future Appointments             In 3 days [place], Devonne Doughty, [facility], [place]   In 4 months [place], Devonne Doughty, [facility], [facility]

## 2019-06-27 NOTE — Telephone Encounter (Signed)
Requested medication (s) are due for refill today: yes  Requested medication (s) are on the active medication list: yes  Last refill:  06/23/18  Future visit scheduled: yes  Notes to clinic:  Sig in previous prescription indicated to stop after RF were 0   Requested Prescriptions  Pending Prescriptions Disp Refills   meloxicam (MOBIC) 15 MG tablet [Pharmacy Med Name: MELOXICAM 15MG  TABLETS] 30 tablet     Sig: TAKE 1 TABLET BY MOUTH EVERY DAY AS NEEDED FOR PAIN. PREFERRED TO TAKE UP TO 1-2 WEEKS AT A TIME THEN STOP      Analgesics:  COX2 Inhibitors Failed - 06/27/2019  4:10 PM      Failed - Cr in normal range and within 360 days    Creat  Date Value Ref Range Status  05/12/2019 1.20 (H) 0.60 - 0.88 mg/dL Final    Comment:    For patients >47 years of age, the reference limit for Creatinine is approximately 13% higher for people identified as African-American. .    Creatinine, Ser  Date Value Ref Range Status  06/21/2019 1.44 (H) 0.44 - 1.00 mg/dL Final          Passed - HGB in normal range and within 360 days    Hemoglobin  Date Value Ref Range Status  06/21/2019 14.1 12.0 - 15.0 g/dL Final   HGB  Date Value Ref Range Status  10/13/2013 13.7 12.0 - 16.0 g/dL Final          Passed - Patient is not pregnant      Passed - Valid encounter within last 12 months    Recent Outpatient Visits           1 month ago Benign hypertension with CKD (chronic kidney disease) stage III   [place], DO   7 months ago Benign hypertension with CKD (chronic kidney disease) stage III   [place], Devonne Doughty, DO   1 year ago Essential hypertension   [place], Devonne Doughty, DO   1 year ago Essential hypertension   [place], Devonne Doughty, DO   2 years ago Change in bowel function   [place], DO        Future Appointments             In 3 days [place], Devonne Doughty, DO [facility], [place]   In 4 months [place], Devonne Doughty, DO [facility], [facility]             Signed Prescriptions Disp Refills   atorvastatin (LIPITOR) 10 MG tablet 90 tablet 2    Sig: TAKE 1 TABLET(10 MG) BY MOUTH AT BEDTIME      Cardiovascular:  Antilipid - Statins Passed - 06/27/2019  4:10 PM      Passed - Total Cholesterol in normal range and within 360 days    Cholesterol  Date Value Ref Range Status  05/12/2019 150 <200 mg/dL Final          Passed - LDL in normal range and within 360 days    LDL Cholesterol (Calc)  Date Value Ref Range Status  05/12/2019 70 mg/dL (calc) Final    Comment:    Reference range: <100 . Desirable range <100 mg/dL for primary prevention;   <70 mg/dL for patients with CHD or diabetic patients  with > or = 2 CHD risk factors. Marland Kitchen  LDL-C is now calculated using the Martin-Hopkins  calculation, which is a validated novel method providing  better accuracy than the Friedewald equation in the  estimation of LDL-C.  Cresenciano Genre et al. Annamaria Helling. [id number]): 2061-2068  (http://education.[facility].com/faq/FAQ164)           Passed - HDL in normal range and within 360 days    HDL  Date Value Ref Range Status  05/12/2019 61 > OR = 50 mg/dL Final          Passed - Triglycerides in normal range and within 360 days    Triglycerides  Date Value Ref Range Status  05/12/2019 109 <150 mg/dL Final          Passed - Patient is not pregnant      Passed - Valid encounter within last 12 months    Recent Outpatient Visits           1 month ago Benign hypertension with CKD (chronic kidney disease) stage III   [place], DO   7 months ago Benign hypertension with CKD (chronic kidney disease) stage III   [place], Devonne Doughty, DO   1 year ago Essential hypertension   [facility] Olin Hauser, DO   1 year ago Essential hypertension   [place], Devonne Doughty, DO   2 years ago Change in bowel function   [place], Devonne Doughty, DO       Future Appointments             In 3 days [place], Devonne Doughty, DO [facility], [place]   In 4 months [place], Devonne Doughty, [facility], [facility]

## 2019-06-30 ENCOUNTER — Other Ambulatory Visit: Payer: Self-pay

## 2019-06-30 ENCOUNTER — Ambulatory Visit (INDEPENDENT_AMBULATORY_CARE_PROVIDER_SITE_OTHER): Payer: Medicare Other | Admitting: Family Medicine

## 2019-06-30 ENCOUNTER — Encounter: Payer: Self-pay | Admitting: Family Medicine

## 2019-06-30 VITALS — BP 133/74 | HR 111 | Temp 97.3°F | Resp 16 | Ht 63.0 in | Wt 180.0 lb

## 2019-06-30 DIAGNOSIS — N200 Calculus of kidney: Secondary | ICD-10-CM | POA: Diagnosis not present

## 2019-06-30 DIAGNOSIS — K8020 Calculus of gallbladder without cholecystitis without obstruction: Secondary | ICD-10-CM | POA: Diagnosis not present

## 2019-06-30 DIAGNOSIS — N1330 Unspecified hydronephrosis: Secondary | ICD-10-CM

## 2019-06-30 DIAGNOSIS — N183 Chronic kidney disease, stage 3 unspecified: Secondary | ICD-10-CM

## 2019-06-30 DIAGNOSIS — K769 Liver disease, unspecified: Secondary | ICD-10-CM

## 2019-06-30 DIAGNOSIS — I129 Hypertensive chronic kidney disease with stage 1 through stage 4 chronic kidney disease, or unspecified chronic kidney disease: Secondary | ICD-10-CM

## 2019-06-30 DIAGNOSIS — N1831 Chronic kidney disease, stage 3a: Secondary | ICD-10-CM

## 2019-06-30 NOTE — Progress Notes (Signed)
Subjective:    Patient ID: Angela Bullock, female    DOB: 12-06-1935, 84 y.o.   MRN: [id number]  Angela Bullock is a 84 y.o. female presenting on 06/30/2019 for Hospitalization Follow-up (distal right ureteral calculus)   HPI  ED FOLLOW-UP VISIT  Hospital/Location: [facility] Date of ED Visit: 06/21/19  Reason for Presenting to ED: RLQ Abdominal Pain / Flank Pain Primary (+Secondary) Diagnosis: Right Ureteral Stone  FOLLOW-UP  - ED provider note and record have been reviewed - Patient presents today about 9 days after recent ED visit. Brief summary of recent course, patient had symptoms of RLQ abdominal pain reduced appetite loose stools. testing in ED with labs chemistry, negative lipase, had UA that was abnormal showed Hgb RBC, treated with pain nausea med, had CT Abdomen - treated for UTI with Fosfomycin and identified several other findings, liver lesion, R sided hydronephrosis UPJ ureteral stone, given rx Flomax to pass. Also gallstone without other problem.  - Today reports overall has done well after discharge from ED. Symptoms of pain have resolved. Did not take Hydrocodone. Took one dose Tramadol from before.  - New medications on discharge: Tamsulosin 0.4mg  daily, short term Hydrocodone PRN, Zofran PRN  No prior images of nephrolithiasis or liver lesion. Interested to follow-up on these items. History of gallstones since 2007.   FOR A&P I have reviewed the discharge medication list, and have reconciled the current and discharge medications today.    Current Outpatient Medications:  .  aspirin EC 325 MG tablet, Take 325 mg by mouth daily. As needed, Disp: , Rfl:  .  atorvastatin (LIPITOR) 10 MG tablet, TAKE 1 TABLET(10 MG) BY MOUTH AT BEDTIME, Disp: 90 tablet, Rfl: 2 .  baclofen (LIORESAL) 10 MG tablet, TAKE 1/2 TO 1 TABLET(5 TO 10 MG) BY MOUTH TWICE DAILY AS NEEDED FOR MUSCLE SPASMS, Disp: 30 tablet, Rfl: 2 .  Biotin 10 MG CAPS, Take 10 mg by mouth daily., Disp: , Rfl:  .   calcium carbonate (OSCAL) 1500 (600 CA) MG TABS tablet, Take 1 tablet by mouth every other day., Disp: , Rfl:  .  Calcium Carbonate-Vitamin D 600-200 MG-UNIT TABS, Take by mouth., Disp: , Rfl:  .  co-enzyme Q-10 50 MG capsule, Take 50 mg by mouth daily., Disp: , Rfl:  .  glucosamine-chondroitin 500-400 MG tablet, Take 1 tablet by mouth 3 (three) times daily., Disp: , Rfl:  .  lisinopril (ZESTRIL) 20 MG tablet, Take 1 tablet (20 mg total) by mouth daily., Disp: 90 tablet, Rfl: 3 .  meloxicam (MOBIC) 15 MG tablet, TAKE 1 TABLET BY MOUTH EVERY DAY AS NEEDED FOR PAIN, PREFER TO TAKE UP TO 1- 2 WEEKS AT A TIME THEN STOP, Disp: 30 tablet, Rfl: 3 .  Multiple Vitamins-Minerals (CENTRUM SILVER PO), Take 1 tablet by mouth daily., Disp: , Rfl:  .  Omega-3 Krill Oil 300 MG CAPS, Take 1 capsule by mouth daily., Disp: , Rfl:  .  tamsulosin (FLOMAX) 0.4 MG CAPS capsule, Take 1 capsule (0.4 mg total) by mouth daily., Disp: 14 capsule, Rfl: 0 .  traMADol (ULTRAM) 50 MG tablet, Take 1 tablet (50 mg total) by mouth every 8 (eight) hours as needed for moderate pain or severe pain., Disp: 30 tablet, Rfl: 1 .  cholecalciferol (VITAMIN D) 1000 UNITS tablet, Take 1,000 Units by mouth daily., Disp: , Rfl:  .  Resveratrol 250 MG CAPS, , Disp: , Rfl:   ------------------------------------------------------------------------- Social History   Tobacco Use  . Smoking  status: Never Smoker  . Smokeless tobacco: Never Used  Substance Use Topics  . Alcohol use: No    Alcohol/week: 0.0 standard drinks  . Drug use: No    Review of Systems Per HPI unless specifically indicated above     Objective:    BP 133/74   Pulse (!) 111   Temp (!) 97.3 F (36.3 C) (Temporal)   Resp 16   Ht 5\' 3"  (1.6 m)   Wt 180 lb (81.6 kg)   SpO2 99%   BMI 31.89 kg/m   Wt Readings from Last 3 Encounters:  06/30/19 180 lb (81.6 kg)  06/21/19 180 lb (81.6 kg)  05/21/19 186 lb (84.4 kg)    Physical Exam Vitals and nursing note  reviewed.  Constitutional:      General: She is not in acute distress.    Appearance: She is well-developed. She is not diaphoretic.     Comments: Well-appearing, comfortable, cooperative  HENT:     Head: Normocephalic and atraumatic.  Eyes:     General:        Right eye: No discharge.        Left eye: No discharge.     Conjunctiva/sclera: Conjunctivae normal.  Neck:     Thyroid: No thyromegaly.  Cardiovascular:     Rate and Rhythm: Normal rate and regular rhythm.     Heart sounds: Normal heart sounds. No murmur.  Pulmonary:     Effort: Pulmonary effort is normal. No respiratory distress.     Breath sounds: Normal breath sounds. No wheezing or rales.  Abdominal:     General: Bowel sounds are normal. There is no distension.     Palpations: Abdomen is soft. There is no mass.     Tenderness: There is no abdominal tenderness.  Musculoskeletal:        General: Normal range of motion.     Cervical back: Normal range of motion and neck supple.     Comments: No CVAT  Lymphadenopathy:     Cervical: No cervical adenopathy.  Skin:    General: Skin is warm and dry.     Findings: No erythema or rash.  Neurological:     Mental Status: She is alert and oriented to person, place, and time.  Psychiatric:        Behavior: Behavior normal.     Comments: Well groomed, good eye contact, normal speech and thoughts      I have personally reviewed the radiology report from 06/21/19 CT.  CT Abdomen Pelvis Wo ContrastPerformed 06/22/2019 Final result  Study Result CLINICAL DATA: Right lower quadrant abdominal pain  EXAM: CT ABDOMEN AND PELVIS WITHOUT CONTRAST  TECHNIQUE: Multidetector CT imaging of the abdomen and pelvis was performed following the standard protocol without IV contrast.  COMPARISON: Radiograph 05/09/2017  FINDINGS: Lower chest: Bandlike areas of opacity in the lung bases likely reflect atelectasis and/or scarring. Small punctate calcified granuloma in the right middle  lobe (2/2). There is dense calcification of the mitral annulus and likely calcifications on the aortic leaflets. Normal cardiac size. No pericardial effusion.  Hepatobiliary: There is a 4.4 x 2.4 cm ovoid lesion arising from the anterior left lobe liver, segment 3 (2/21). This is incompletely characterized on this exam. No other concerning liver lesions. Smooth liver surface contour. Normal attenuation of the liver. There is a calcified gallstone within the largely collapsed gallbladder. No pericholecystic fluid or inflammation. No biliary ductal dilatation or calcified intraductal gallstones.  Pancreas: Partial fatty replacement. No  pancreatic ductal dilatation or surrounding inflammatory changes.  Spleen: Normal in size without focal abnormality.  Adrenals/Urinary Tract: Normal adrenal glands. Asymmetric right perinephric and periureteral stranding with moderate hydroureteronephrosis to the level of a 3 mm calculus in the distal right ureter just proximal to the ureterovesicular junction (2/66). Small fluid attenuation cysts are present in both kidneys, largest in the left measuring up to 4 cm (2/28). No concerning renal lesion. Urinary bladder is largely decompressed at the time of exam and therefore poorly evaluated by CT imaging. No other gross bladder abnormality is seen.  Stomach/Bowel: Large hiatal hernia without evidence of obstruction given passage of contrast beyond this level. Distal stomach duodenum and small bowel are unremarkable. Contrast traverses to the level of the cecum. Question postsurgical changes along the cecum which may reflect prior appendectomy, correlate with surgical history. No colonic dilatation or wall thickening. Scattered colonic diverticula without focal pericolonic inflammation to suggest diverticulitis.  Vascular/Lymphatic: Atherosclerotic calcifications throughout the abdominal aorta and branch vessels. No aneurysm or ectasia. No enlarged  abdominopelvic lymph nodes.  Reproductive: Uterus is surgically absent. No concerning adnexal lesions.  Other: Small fat containing umbilical hernia. No abdominopelvic free fluid or free gas. No bowel containing hernias.  Musculoskeletal: No acute osseous abnormality or suspicious osseous lesion. Multilevel degenerative changes are present in the imaged portions of the spine. Levocurvature of the thoracolumbar junction, dextrocurvature of the lumbar spine. Mild lateral listhesis L4 on L5 with associated discogenic and facet degenerative changes at this level. No spondylolysis is evident. Additional degenerative changes in the hips and pelvis.  IMPRESSION: 1. Asymmetric right perinephric and periureteral stranding with moderate hydroureteronephrosis to the level of a 3 mm calculus in the distal right ureter just proximal to the ureterovesicular junction. 2. There is a 4.4 x 2.4 cm ovoid lesion arising from the anterior left lobe liver, segment 3 (2/21). This is incompletely characterized on this exam. Recommend further characterization with multiphase liver MRI. 3. Cholelithiasis without evidence of acute cholecystitis. 4. Large hiatal hernia without evidence of obstruction. 5. Colonic diverticulosis without evidence of diverticulitis. 6. Aortic Atherosclerosis (ICD10-I70.0).   Electronically Signed By: Lovena Le M.D. On: 06/22/2019 02:22   Results for orders placed or performed during the hospital encounter of 06/21/19  Lipase, blood  Result Value Ref Range   Lipase 34 11 - 51 U/L  Comprehensive metabolic panel  Result Value Ref Range   Sodium 136 135 - 145 mmol/L   Potassium 4.5 3.5 - 5.1 mmol/L   Chloride 105 98 - 111 mmol/L   CO2 18 (L) 22 - 32 mmol/L   Glucose, Bld 128 (H) 70 - 99 mg/dL   BUN 24 (H) 8 - 23 mg/dL   Creatinine, Ser 1.44 (H) 0.44 - 1.00 mg/dL   Calcium 9.9 8.9 - 10.3 mg/dL   Total Protein 7.3 6.5 - 8.1 g/dL   Albumin 4.1 3.5 - 5.0 g/dL   AST 24  15 - 41 U/L   ALT 16 0 - 44 U/L   Alkaline Phosphatase 85 38 - 126 U/L   Total Bilirubin 1.2 0.3 - 1.2 mg/dL   GFR calc non Af Amer 34 (L) >60 mL/min   GFR calc Af Amer 39 (L) >60 mL/min   Anion gap 13 5 - 15  Urinalysis, Complete w Microscopic  Result Value Ref Range   Color, Urine STRAW (A) YELLOW   APPearance CLEAR (A) CLEAR   Specific Gravity, Urine 1.002 (L) 1.005 - 1.030   pH 6.0 5.0 -  8.0   Glucose, UA NEGATIVE NEGATIVE mg/dL   Hgb urine dipstick LARGE (A) NEGATIVE   Bilirubin Urine NEGATIVE NEGATIVE   Ketones, ur NEGATIVE NEGATIVE mg/dL   Protein, ur NEGATIVE NEGATIVE mg/dL   Nitrite NEGATIVE NEGATIVE   Leukocytes,Ua SMALL (A) NEGATIVE   RBC / HPF 21-50 0 - 5 RBC/hpf   WBC, UA 0-5 0 - 5 WBC/hpf   Bacteria, UA RARE (A) NONE SEEN   Squamous Epithelial / LPF 0-5 0 - 5  CBC with Differential  Result Value Ref Range   WBC 7.9 4.0 - 10.5 K/uL   RBC 4.55 3.87 - 5.11 MIL/uL   Hemoglobin 14.1 12.0 - 15.0 g/dL   HCT 41.0 36.0 - 46.0 %   MCV 90.1 80.0 - 100.0 fL   MCH 31.0 26.0 - 34.0 pg   MCHC 34.4 30.0 - 36.0 g/dL   RDW 12.2 11.5 - 15.5 %   Platelets 192 150 - 400 K/uL   nRBC 0.0 0.0 - 0.2 %   Neutrophils Relative % 86 %   Neutro Abs 6.8 1.7 - 7.7 K/uL   Lymphocytes Relative 9 %   Lymphs Abs 0.7 0.7 - 4.0 K/uL   Monocytes Relative 4 %   Monocytes Absolute 0.4 0.1 - 1.0 K/uL   Eosinophils Relative 0 %   Eosinophils Absolute 0.0 0.0 - 0.5 K/uL   Basophils Relative 0 %   Basophils Absolute 0.0 0.0 - 0.1 K/uL   Immature Granulocytes 1 %   Abs Immature Granulocytes 0.04 0.00 - 0.07 K/uL      Assessment & Plan:   Problem List Items Addressed This Visit    CKD (chronic kidney disease) stage 3, GFR 30-59 ml/min   Relevant Orders   US Renal   Calculus of gallbladder   Relevant Orders   US Abdomen Limited RUQ   Benign hypertension with CKD (chronic kidney disease) stage III - Primary    Other Visit Diagnoses    Right nephrolithiasis       Relevant Orders   US  Renal   Hydronephrosis of right kidney       Relevant Orders   US Renal   Liver lesion, left lobe       Relevant Orders   US Abdomen Limited RUQ      UTI - resolved, s/p fosfomycin  Liver lesion / cholelithiasis Uncertain exact nature. Radiology recommended MRI on the CT to characterize mass. May still warrant this in future. However will pursue an ultrasound since she will require [place] for kidney upcoming, will evaluate gallbladder as well with cholelithiasis. - F/u results 6 weeks  R Hydronephrosis, perinephric stranding UPJ Stone CKD III Reassurance Urine is clear On flomax, may have passed stone, keep on flomax until finish course Improve hydration Ordered Renal US in 6 weeks Future consider renal vs uro if indicated  Orders Placed This Encounter  Procedures  . US Abdomen Limited RUQ    Also - ordered Renal Ultrasound - should be scheduled at same time.    Standing Status:   Future    Standing Expiration Date:   12/30/2019    Scheduling Instructions:     Also - ordered Renal Ultrasound - should be scheduled at same time.    Order Specific Question:   Reason for Exam (SYMPTOM  OR DIAGNOSIS REQUIRED)    Answer:   abnormal anterior L lobe liver lesion on CT study 05/2019 and gallstones    Order Specific Question:   Preferred imaging location?  Answer:    [facility]  . US Renal    Also - ordered RUQ Abd Ultrasound - should be scheduled at same time.    Standing Status:   Future    Standing Expiration Date:   12/30/2019    Scheduling Instructions:     Also - ordered RUQ Abd Ultrasound - should be scheduled at same time.    Order Specific Question:   Reason for Exam (SYMPTOM  OR DIAGNOSIS REQUIRED)    Answer:   Abnormal CT 05/2019 Right hydroureteronephrosis, R perinephric stranding, 75mm ureteral calculus UPJ    Order Specific Question:   Preferred imaging location?    Answer:   [facility]     No orders of the defined types were placed in this  encounter.   Follow up plan: Return in about 6 weeks (around 08/11/2019) for 6 week follow-up ultrasound results, virtual if needed.   Future RUQ Abd [place] and Renal US ordered, called Radiology [place] Dept - confirm orders preferred for the requested imaging tests. To be scheduled at same time in about 6 weeks.   Nobie Putnam, [facility] 06/30/2019, 9:52 AM

## 2019-06-30 NOTE — Patient Instructions (Addendum)
Thank you for coming to the office today.  Stay tuned for upcoming scheduling ultrasound testing - in about 6 weeks. For Liver Gallbladder RUQ Abdomen and Kidney.  Keep on track with your symptoms, if new or worsening concerns, contact us or seek care sooner.   Please schedule a Follow-up Appointment to: Return in about 6 weeks (around 08/11/2019) for 6 week follow-up ultrasound results, virtual if needed.  If you have any other questions or concerns, please feel free to call the office or send a message through [place]. You may also schedule an earlier appointment if necessary.  Additionally, you may be receiving a survey about your experience at our office within a few days to 1 week by e-mail or mail. We value your feedback.  Nobie Putnam, DO [place]

## 2019-07-01 ENCOUNTER — Telehealth: Payer: Self-pay

## 2019-07-01 NOTE — Telephone Encounter (Signed)
It appears the Renal + Abdominal US was scheduled for 07/08/19, however I was intending to have it done within 4-6 weeks instead actually.  I called [facility] [place] dept and they gave me phone # for the patient to re-schedule when she is ready - # [phone].  I called [name] back and we agreed that we can move up the apt slightly, she will call us to re-schedule and push it out to about 2-3 weeks from now. And then she will re-schedule the 08/10/19 f/u with me to make it about 1 week after her ultrasound.  Nobie Putnam, DO [facility] 07/01/2019, 12:54 PM

## 2019-07-01 NOTE — Telephone Encounter (Signed)
Copied from [place] [phone]. Topic: Referral - Status >> Jul 01, 2019 10:17 AM Scherrie Gerlach wrote: Reason for CRM: pt states it was her understanding she would have [place] in about 6 weks - closer to her appt 7/12 with Dr Raliegh Ip. Pt was scheduled for [place] on 6/9.  Pt hopes this is a mistake, and closer to 7/12 fup. Ok to leave message.

## 2019-07-08 ENCOUNTER — Other Ambulatory Visit: Payer: Medicare Other

## 2019-07-08 ENCOUNTER — Ambulatory Visit: Payer: Medicare Other

## 2019-07-09 ENCOUNTER — Ambulatory Visit: Payer: Medicare Other | Admitting: Podiatry

## 2019-07-20 ENCOUNTER — Ambulatory Visit
Admission: RE | Admit: 2019-07-20 | Discharge: 2019-07-20 | Disposition: A | Discharge status: Home / Self Care | Payer: Medicare Other | Source: Ambulatory Visit | Attending: Family Medicine | Admitting: Family Medicine

## 2019-07-20 ENCOUNTER — Other Ambulatory Visit: Payer: Self-pay

## 2019-07-20 DIAGNOSIS — K8020 Calculus of gallbladder without cholecystitis without obstruction: Secondary | ICD-10-CM | POA: Insufficient documentation

## 2019-07-20 DIAGNOSIS — N1831 Chronic kidney disease, stage 3a: Secondary | ICD-10-CM

## 2019-07-20 DIAGNOSIS — N1330 Unspecified hydronephrosis: Secondary | ICD-10-CM | POA: Insufficient documentation

## 2019-07-20 DIAGNOSIS — K769 Liver disease, unspecified: Secondary | ICD-10-CM | POA: Insufficient documentation

## 2019-07-20 DIAGNOSIS — N200 Calculus of kidney: Secondary | ICD-10-CM

## 2019-07-28 ENCOUNTER — Other Ambulatory Visit: Payer: Self-pay | Admitting: Family Medicine

## 2019-07-28 DIAGNOSIS — M159 Polyosteoarthritis, unspecified: Secondary | ICD-10-CM

## 2019-07-28 DIAGNOSIS — M545 Low back pain, unspecified: Secondary | ICD-10-CM

## 2019-07-28 DIAGNOSIS — G8929 Other chronic pain: Secondary | ICD-10-CM

## 2019-08-10 ENCOUNTER — Ambulatory Visit (INDEPENDENT_AMBULATORY_CARE_PROVIDER_SITE_OTHER): Payer: Medicare Other | Admitting: Family Medicine

## 2019-08-10 ENCOUNTER — Encounter: Payer: Self-pay | Admitting: Family Medicine

## 2019-08-10 ENCOUNTER — Other Ambulatory Visit: Payer: Self-pay

## 2019-08-10 VITALS — BP 129/68 | HR 91 | Temp 97.1°F | Resp 16 | Ht 63.0 in | Wt 180.0 lb

## 2019-08-10 DIAGNOSIS — N183 Chronic kidney disease, stage 3 unspecified: Secondary | ICD-10-CM

## 2019-08-10 DIAGNOSIS — I129 Hypertensive chronic kidney disease with stage 1 through stage 4 chronic kidney disease, or unspecified chronic kidney disease: Secondary | ICD-10-CM

## 2019-08-10 DIAGNOSIS — K7689 Other specified diseases of liver: Secondary | ICD-10-CM

## 2019-08-10 DIAGNOSIS — N281 Cyst of kidney, acquired: Secondary | ICD-10-CM

## 2019-08-10 NOTE — Patient Instructions (Addendum)
Thank you for coming to the office today.  Labs ordered already  Vitamin D, Cancer antigen 27/29, CMET, CBC   decision at time of visit, if need ultrasound repeat abdomen/RUQ liver, or MRI    DUE for FASTING BLOOD WORK (no food or drink after midnight before the lab appointment, only water or coffee without cream/sugar on the morning of)  - Make sure Lab Only appointment is at about 1 week before your next appointment, so that results will be available  For Lab Results, once available within 2-3 days of blood draw, you can can log in to MyChart online to view your results and a brief explanation. Also, we can discuss results at next follow-up visit.     Please schedule a Follow-up Appointment to: Return in about 3 months (around 11/10/2019) for keep apt as scheduled.  If you have any other questions or concerns, please feel free to call the office or send a message through [place]. You may also schedule an earlier appointment if necessary.  Additionally, you may be receiving a survey about your experience at our office within a few days to 1 week by e-mail or mail. We value your feedback.  Nobie Putnam, DO [place]

## 2019-08-10 NOTE — Progress Notes (Signed)
Subjective:    Patient ID: Angela Bullock, female    DOB: 03/01/1935, 84 y.o.   MRN: [id number]  Angela Bullock is a 84 y.o. female presenting on 08/10/2019 for Liver Cyst (follow up imaging)   HPI   Follow-up CKD III / Renal Cyst / Hydronephrosis See imaging results below. Interval update, has had Renal US - demonstrated resolved hydronephrosis, prior nephrolithiasis s/p resolved. Angela Bullock has not had any further urinary problem.  Follow-up liver cyst ultrasound Recent results showed normal liver tissue on [place], no sign of cirrhosis or other liver disease, had . 3.6 x 2.4 x 3.5 cm hypoechoic lesion in the anterior aspect of the left lobe of the liver on [place]. They recommended MRI. - Angela Bullock is doing well asymptomatic. Today Angela Bullock reports that is not ready for MRI of liver - Angela Bullock may reconsider in future.   Depression screen [facility] 2/9 05/21/2019 11/18/2018 03/17/2018  Decreased Interest 0 0 0  Down, Depressed, Hopeless 0 0 0  PHQ - 2 Score 0 0 0  Altered sleeping - - -  Tired, decreased energy - - -  Change in appetite - - -  Feeling bad or failure about yourself  - - -  Trouble concentrating - - -  Moving slowly or fidgety/restless - - -  Suicidal thoughts - - -  PHQ-9 Score - - -  Difficult doing work/chores - - -    Social History   Tobacco Use  . Smoking status: Never Smoker  . Smokeless tobacco: Never Used  Vaping Use  . Vaping Use: Never used  Substance Use Topics  . Alcohol use: No    Alcohol/week: 0.0 standard drinks  . Drug use: No    Review of Systems Per HPI unless specifically indicated above     Objective:    BP 129/68   Pulse 91   Temp (!) 97.1 F (36.2 C) (Temporal)   Resp 16   Ht 5\' 3"  (1.6 m)   Wt 180 lb (81.6 kg)   SpO2 100%   BMI 31.89 kg/m   Wt Readings from Last 3 Encounters:  08/10/19 180 lb (81.6 kg)  06/30/19 180 lb (81.6 kg)  06/21/19 180 lb (81.6 kg)    Physical Exam Vitals and nursing note reviewed.  Constitutional:      General: Angela Bullock is not  in acute distress.    Appearance: Angela Bullock is well-developed. Angela Bullock is not diaphoretic.     Comments: Well-appearing, comfortable, cooperative  HENT:     Head: Normocephalic and atraumatic.  Eyes:     General:        Right eye: No discharge.        Left eye: No discharge.     Conjunctiva/sclera: Conjunctivae normal.  Cardiovascular:     Rate and Rhythm: Normal rate.  Pulmonary:     Effort: Pulmonary effort is normal.  Skin:    General: Skin is warm and dry.     Findings: No erythema or rash.  Neurological:     Mental Status: Angela Bullock is alert and oriented to person, place, and time.  Psychiatric:        Behavior: Behavior normal.     Comments: Well groomed, good eye contact, normal speech and thoughts      I have personally reviewed the radiology report from 07/20/19 Abdominal / Renal US  CLINICAL DATA:  84 year old female with history of liver lesion noted on prior CT examination. Follow-up study.  EXAM: ULTRASOUND ABDOMEN LIMITED RIGHT  UPPER QUADRANT  COMPARISON:  No prior abdominal ultrasound. CT the abdomen and pelvis 06/22/2019.  FINDINGS: Gallbladder:  Large echogenic focus with posterior acoustic shadowing in the gallbladder fundus measuring 2.7 cm in diameter. Gallbladder is not distended. Gallbladder wall thickness is normal at 2.4 mm. No pericholecystic fluid. Per report from the sonographer, there was no sonographic Murphy's sign on examination.  Common bile duct:  Diameter: 3.2 mm  Liver:  In the anterior aspect of the left lobe of the liver there is a 3.6 x 2.4 x 3.5 cm hypoechoic lesion with some increased through transmission. Within normal limits in parenchymal echogenicity. Portal vein is patent on color Doppler imaging with normal direction of blood flow towards the liver.  Other: None.  IMPRESSION: 1. 3.6 x 2.4 x 3.5 cm hypoechoic lesion in the anterior aspect of the left lobe of the liver, incompletely characterized on today's ultrasound  examination. This may simply represent a proteinaceous cyst, however, definitive characterization of this lesion with abdominal MRI with and without IV gadolinium is once again recommended. 2. Cholelithiasis without evidence of acute cholecystitis at this time.   Electronically Signed   By: Vinnie Langton M.D.   On: 07/20/2019 16:10  ------------  CLINICAL DATA:  Abnormal CT. Right hydronephrosis with stone near the ureterovesical junction.  EXAM: RENAL / URINARY TRACT ULTRASOUND COMPLETE  COMPARISON:  CT abdomen and pelvis 06/22/2019  FINDINGS: Right Kidney:  Renal measurements: 8.7 x 3.1 x 5.2 cm = volume: 73 mL . Echogenicity within normal limits. Renal cortical thinning. No hydronephrosis. 2.5 cm lower pole cyst.  Left Kidney:  Renal measurements: 9.9 x 4.8 x 4.5 cm = volume: 113 mL. Echogenicity within normal limits. Renal cortical thinning. No hydronephrosis. 3.8 cm upper pole cyst.  Bladder:  Appears normal for degree of bladder distention.  Other:  None.  IMPRESSION: 1. Resolved right hydronephrosis. 2. Bilateral renal cortical thinning and cysts.   Electronically Signed   By: Logan Bores M.D.   On: 07/20/2019 16:07  Results for orders placed or performed during the hospital encounter of 06/21/19  Lipase, blood  Result Value Ref Range   Lipase 34 11 - 51 U/L  Comprehensive metabolic panel  Result Value Ref Range   Sodium 136 135 - 145 mmol/L   Potassium 4.5 3.5 - 5.1 mmol/L   Chloride 105 98 - 111 mmol/L   CO2 18 (L) 22 - 32 mmol/L   Glucose, Bld 128 (H) 70 - 99 mg/dL   BUN 24 (H) 8 - 23 mg/dL   Creatinine, Ser 1.44 (H) 0.44 - 1.00 mg/dL   Calcium 9.9 8.9 - 10.3 mg/dL   Total Protein 7.3 6.5 - 8.1 g/dL   Albumin 4.1 3.5 - 5.0 g/dL   AST 24 15 - 41 U/L   ALT 16 0 - 44 U/L   Alkaline Phosphatase 85 38 - 126 U/L   Total Bilirubin 1.2 0.3 - 1.2 mg/dL   GFR calc non Af Amer 34 (L) >60 mL/min   GFR calc Af Amer 39 (L) >60  mL/min   Anion gap 13 5 - 15  Urinalysis, Complete w Microscopic  Result Value Ref Range   Color, Urine STRAW (A) YELLOW   APPearance CLEAR (A) CLEAR   Specific Gravity, Urine 1.002 (L) 1.005 - 1.030   pH 6.0 5.0 - 8.0   Glucose, UA NEGATIVE NEGATIVE mg/dL   Hgb urine dipstick LARGE (A) NEGATIVE   Bilirubin Urine NEGATIVE NEGATIVE   Ketones, ur NEGATIVE NEGATIVE  mg/dL   Protein, ur NEGATIVE NEGATIVE mg/dL   Nitrite NEGATIVE NEGATIVE   Leukocytes,Ua SMALL (A) NEGATIVE   RBC / HPF 21-50 0 - 5 RBC/hpf   WBC, UA 0-5 0 - 5 WBC/hpf   Bacteria, UA RARE (A) NONE SEEN   Squamous Epithelial / LPF 0-5 0 - 5  CBC with Differential  Result Value Ref Range   WBC 7.9 4.0 - 10.5 K/uL   RBC 4.55 3.87 - 5.11 MIL/uL   Hemoglobin 14.1 12.0 - 15.0 g/dL   HCT 41.0 36 - 46 %   MCV 90.1 80.0 - 100.0 fL   MCH 31.0 26.0 - 34.0 pg   MCHC 34.4 30.0 - 36.0 g/dL   RDW 12.2 11.5 - 15.5 %   Platelets 192 150 - 400 K/uL   nRBC 0.0 0.0 - 0.2 %   Neutrophils Relative % 86 %   Neutro Abs 6.8 1.7 - 7.7 K/uL   Lymphocytes Relative 9 %   Lymphs Abs 0.7 0.7 - 4.0 K/uL   Monocytes Relative 4 %   Monocytes Absolute 0.4 0 - 1 K/uL   Eosinophils Relative 0 %   Eosinophils Absolute 0.0 0 - 0 K/uL   Basophils Relative 0 %   Basophils Absolute 0.0 0 - 0 K/uL   Immature Granulocytes 1 %   Abs Immature Granulocytes 0.04 0.00 - 0.07 K/uL      Assessment & Plan:   Problem List Items Addressed This Visit    Liver cyst   Benign hypertension with CKD (chronic kidney disease) stage III - Primary    Other Visit Diagnoses    Bilateral renal cysts         Resolved hydronephrosis HTN controlled, with CKD Renal US results stable now with resolved hydro and with cysts, no further complication  Liver Cyst Identified on US imaging, radiology recommends MRI for better characterization, otherwise no liver parenchymal disease identified, small cyst without other concerning features. - Discussion today, patient declines  MRI, for various reasons, Angela Bullock will reconsider in 3 months. Angela Bullock says if it is normal - then that would be good news and Angela Bullock is not worried, if it turns out it is a problem and requires further testing / treatment, Angela Bullock says Angela Bullock is not ready to pursue anything more aggressive right now. Angela Bullock is primarily helping care for her husband at this time.  Next visit 3 months, we will make a decision to repeat US for size of cyst or order MRI if Angela Bullock prefers   No orders of the defined types were placed in this encounter.    Follow up plan: Return in about 3 months (around 11/10/2019) for keep apt as scheduled.  Future labs ordered already for 11/13/19 - Vitamin D, Cancer antigen 27/29, CMET, CBC   decision at time of visit, if need ultrasound repeat abdomen/RUQ liver, or MRI   Nobie Putnam, DO [facility] 08/10/2019, 10:42 AM

## 2019-08-31 ENCOUNTER — Encounter: Payer: Self-pay | Admitting: Podiatry

## 2019-08-31 ENCOUNTER — Other Ambulatory Visit: Payer: Self-pay

## 2019-08-31 ENCOUNTER — Ambulatory Visit (INDEPENDENT_AMBULATORY_CARE_PROVIDER_SITE_OTHER): Payer: Medicare Other | Admitting: Podiatry

## 2019-08-31 DIAGNOSIS — B351 Tinea unguium: Secondary | ICD-10-CM | POA: Diagnosis not present

## 2019-08-31 DIAGNOSIS — M79676 Pain in unspecified toe(s): Secondary | ICD-10-CM | POA: Diagnosis not present

## 2019-08-31 DIAGNOSIS — N1831 Chronic kidney disease, stage 3a: Secondary | ICD-10-CM

## 2019-08-31 NOTE — Progress Notes (Signed)
This patient returns to my office for at risk foot care.  This patient requires this care by a professional since this patient will be at risk due to having kidney disease.  This patient is unable to cut nails herself since the patient cannot reach her nails.These nails are painful walking and wearing shoes.  This patient presents for at risk foot care today.  General Appearance  Alert, conversant and in no acute stress.  Vascular  Dorsalis pedis and posterior tibial  pulses are palpable  bilaterally.  Capillary return is within normal limits  bilaterally. Temperature is within normal limits  bilaterally.  Neurologic  Senn-Weinstein monofilament wire test within normal limits  bilaterally. Muscle power within normal limits bilaterally.  Nails Thick disfigured discolored nails with subungual debris  from hallux to fifth toes bilaterally. No evidence of bacterial infection or drainage bilaterally.  Orthopedic  No limitations of motion  feet .  No crepitus or effusions noted.  No bony pathology or digital deformities noted. HAV  B/L.  Hammer toes second  B/L.  Skin  normotropic skin with no porokeratosis noted bilaterally.  No signs of infections or ulcers noted.     Onychomycosis  Pain in right toes  Pain in left toes  Consent was obtained for treatment procedures.   Mechanical debridement of nails 1-5  bilaterally performed with a nail nipper.  Filed with dremel without incident.    Return office visit   4 months                   Told patient to return for periodic foot care and evaluation due to potential at risk complications.   Gardiner Barefoot DPM

## 2019-11-01 ENCOUNTER — Other Ambulatory Visit: Payer: Self-pay | Admitting: Family Medicine

## 2019-11-01 DIAGNOSIS — G8929 Other chronic pain: Secondary | ICD-10-CM

## 2019-11-01 DIAGNOSIS — M159 Polyosteoarthritis, unspecified: Secondary | ICD-10-CM

## 2019-11-01 NOTE — Telephone Encounter (Signed)
Requested Prescriptions  Pending Prescriptions Disp Refills   meloxicam (MOBIC) 15 MG tablet [Pharmacy Med Name: MELOXICAM 15MG  TABLETS] 30 tablet 3    Sig: TAKE 1 TABLET BY MOUTH EVERY DAY AS NEEDED FOR PAIN. PREFERRED TO TAKE UP TO 1 TO 2 WEEKS AT A TIME THEN STOP.     Analgesics:  COX2 Inhibitors Failed - 11/01/2019  4:29 PM      Failed - Cr in normal range and within 360 days    Creat  Date Value Ref Range Status  05/12/2019 1.20 (H) 0.60 - 0.88 mg/dL Final    Comment:    For patients >39 years of age, the reference limit for Creatinine is approximately 13% higher for people identified as African-American. .    Creatinine, Ser  Date Value Ref Range Status  06/21/2019 1.44 (H) 0.44 - 1.00 mg/dL Final         Passed - HGB in normal range and within 360 days    Hemoglobin  Date Value Ref Range Status  06/21/2019 14.1 12.0 - 15.0 g/dL Final   HGB  Date Value Ref Range Status  10/13/2013 13.7 12.0 - 16.0 g/dL Final         Passed - Patient is not pregnant      Passed - Valid encounter within last 12 months    Recent Outpatient Visits          2 months ago Benign hypertension with CKD (chronic kidney disease) stage III   [place], DO   4 months ago Benign hypertension with CKD (chronic kidney disease) stage III   [place], DO   5 months ago Benign hypertension with CKD (chronic kidney disease) stage III   [place], DO   11 months ago Benign hypertension with CKD (chronic kidney disease) stage III   [place], Devonne Doughty, DO   1 year ago Essential hypertension   [place], Devonne Doughty, DO      Future Appointments            In 2 weeks [place], Devonne Doughty, DO [facility], [facility]

## 2019-11-12 ENCOUNTER — Other Ambulatory Visit: Payer: Self-pay | Admitting: *Deleted

## 2019-11-12 DIAGNOSIS — N1831 Chronic kidney disease, stage 3a: Secondary | ICD-10-CM

## 2019-11-12 DIAGNOSIS — E559 Vitamin D deficiency, unspecified: Secondary | ICD-10-CM

## 2019-11-12 DIAGNOSIS — N183 Chronic kidney disease, stage 3 unspecified: Secondary | ICD-10-CM

## 2019-11-12 DIAGNOSIS — Z853 Personal history of malignant neoplasm of breast: Secondary | ICD-10-CM

## 2019-11-12 DIAGNOSIS — I129 Hypertensive chronic kidney disease with stage 1 through stage 4 chronic kidney disease, or unspecified chronic kidney disease: Secondary | ICD-10-CM

## 2019-11-12 DIAGNOSIS — M159 Polyosteoarthritis, unspecified: Secondary | ICD-10-CM

## 2019-11-13 ENCOUNTER — Other Ambulatory Visit: Payer: Medicare Other

## 2019-11-13 ENCOUNTER — Other Ambulatory Visit: Payer: Self-pay

## 2019-11-13 DIAGNOSIS — Z853 Personal history of malignant neoplasm of breast: Secondary | ICD-10-CM | POA: Diagnosis not present

## 2019-11-13 DIAGNOSIS — N1831 Chronic kidney disease, stage 3a: Secondary | ICD-10-CM | POA: Diagnosis not present

## 2019-11-13 DIAGNOSIS — M8949 Other hypertrophic osteoarthropathy, multiple sites: Secondary | ICD-10-CM | POA: Diagnosis not present

## 2019-11-13 DIAGNOSIS — E559 Vitamin D deficiency, unspecified: Secondary | ICD-10-CM | POA: Diagnosis not present

## 2019-11-13 DIAGNOSIS — I129 Hypertensive chronic kidney disease with stage 1 through stage 4 chronic kidney disease, or unspecified chronic kidney disease: Secondary | ICD-10-CM | POA: Diagnosis not present

## 2019-11-13 DIAGNOSIS — N183 Chronic kidney disease, stage 3 unspecified: Secondary | ICD-10-CM | POA: Diagnosis not present

## 2019-11-16 LAB — CBC WITH DIFFERENTIAL/PLATELET
Absolute Monocytes: 271 cells/uL (ref 200–950)
Basophils Absolute: 22 cells/uL (ref 0–200)
Basophils Relative: 0.5 %
Eosinophils Absolute: 99 cells/uL (ref 15–500)
Eosinophils Relative: 2.3 %
HCT: 40 % (ref 35.0–45.0)
Hemoglobin: 13.1 g/dL (ref 11.7–15.5)
Lymphs Abs: 1114 cells/uL (ref 850–3900)
MCH: 30.9 pg (ref 27.0–33.0)
MCHC: 32.8 g/dL (ref 32.0–36.0)
MCV: 94.3 fL (ref 80.0–100.0)
MPV: 11 fL (ref 7.5–12.5)
Monocytes Relative: 6.3 %
Neutro Abs: 2795 cells/uL (ref 1500–7800)
Neutrophils Relative %: 65 %
Platelets: 177 10*3/uL (ref 140–400)
RBC: 4.24 10*6/uL (ref 3.80–5.10)
RDW: 11.8 % (ref 11.0–15.0)
Total Lymphocyte: 25.9 %
WBC: 4.3 10*3/uL (ref 3.8–10.8)

## 2019-11-16 LAB — CANCER ANTIGEN 27.29: CA 27.29: 27 U/mL (ref <38)

## 2019-11-16 LAB — COMPLETE METABOLIC PANEL WITH GFR
AG Ratio: 1.7 (calc) (ref 1.0–2.5)
ALT: 9 U/L (ref 6–29)
AST: 12 U/L (ref 10–35)
Albumin: 4 g/dL (ref 3.6–5.1)
Alkaline phosphatase (APISO): 75 U/L (ref 37–153)
BUN/Creatinine Ratio: 21 (calc) (ref 6–22)
BUN: 24 mg/dL (ref 7–25)
CO2: 24 mmol/L (ref 20–32)
Calcium: 10.2 mg/dL (ref 8.6–10.4)
Chloride: 105 mmol/L (ref 98–110)
Creat: 1.17 mg/dL — ABNORMAL HIGH (ref 0.60–0.88)
GFR, Est African American: 51 mL/min/{1.73_m2} — ABNORMAL LOW (ref >=60)
GFR, Est Non African American: 44 mL/min/{1.73_m2} — ABNORMAL LOW (ref >=60)
Globulin: 2.3 g/dL (calc) (ref 1.9–3.7)
Glucose, Bld: 92 mg/dL (ref 65–99)
Potassium: 4.5 mmol/L (ref 3.5–5.3)
Sodium: 140 mmol/L (ref 135–146)
Total Bilirubin: 0.8 mg/dL (ref 0.2–1.2)
Total Protein: 6.3 g/dL (ref 6.1–8.1)

## 2019-11-16 LAB — VITAMIN D 25 HYDROXY (VIT D DEFICIENCY, FRACTURES): Vit D, 25-Hydroxy: 60 ng/mL (ref 30–100)

## 2019-11-20 ENCOUNTER — Encounter: Payer: Self-pay | Admitting: Family Medicine

## 2019-11-20 ENCOUNTER — Other Ambulatory Visit: Payer: Self-pay

## 2019-11-20 ENCOUNTER — Other Ambulatory Visit: Payer: Self-pay | Admitting: Family Medicine

## 2019-11-20 ENCOUNTER — Telehealth: Payer: Self-pay | Admitting: Family Medicine

## 2019-11-20 ENCOUNTER — Ambulatory Visit (INDEPENDENT_AMBULATORY_CARE_PROVIDER_SITE_OTHER): Payer: Medicare Other | Admitting: Family Medicine

## 2019-11-20 VITALS — BP 137/67 | HR 76 | Temp 97.1°F | Resp 16 | Ht 63.0 in | Wt 179.0 lb

## 2019-11-20 DIAGNOSIS — N1831 Chronic kidney disease, stage 3a: Secondary | ICD-10-CM

## 2019-11-20 DIAGNOSIS — E782 Mixed hyperlipidemia: Secondary | ICD-10-CM

## 2019-11-20 DIAGNOSIS — E559 Vitamin D deficiency, unspecified: Secondary | ICD-10-CM

## 2019-11-20 DIAGNOSIS — I129 Hypertensive chronic kidney disease with stage 1 through stage 4 chronic kidney disease, or unspecified chronic kidney disease: Secondary | ICD-10-CM | POA: Diagnosis not present

## 2019-11-20 DIAGNOSIS — Z853 Personal history of malignant neoplasm of breast: Secondary | ICD-10-CM

## 2019-11-20 DIAGNOSIS — Z0000 Encounter for general adult medical examination without abnormal findings: Secondary | ICD-10-CM

## 2019-11-20 DIAGNOSIS — N183 Chronic kidney disease, stage 3 unspecified: Secondary | ICD-10-CM

## 2019-11-20 DIAGNOSIS — K7689 Other specified diseases of liver: Secondary | ICD-10-CM

## 2019-11-20 DIAGNOSIS — K769 Liver disease, unspecified: Secondary | ICD-10-CM

## 2019-11-20 DIAGNOSIS — R7309 Other abnormal glucose: Secondary | ICD-10-CM

## 2019-11-20 NOTE — Assessment & Plan Note (Signed)
Stable, chronic CKD-III  - Secondary to age, HTN, NSAIDs likely  Last lab improved Cr  Plan: 1. Continue intermittent Meloxicam 1-2 weeks on / off, avoid daily use 2. Improve hydration 3. Control BP F/u Cr trend q 6 mo

## 2019-11-20 NOTE — Telephone Encounter (Signed)
Copied from [place] [id number]. Topic: Medicare AWV >> Nov 20, 2019 10:57 AM Cher Nakai R wrote: Reason for CRM:   10/22 Left message to notify patient of on appointment Nov 24, 2019 will be completed by phone not in office -srs

## 2019-11-20 NOTE — Progress Notes (Signed)
Subjective:    Patient ID: Angela Bullock, female    DOB: 06/07/1935, 84 y.o.   MRN: [id number]  Angela Bullock is a 84 y.o. female presenting on 11/20/2019 for Follow-up (liver cyst)   HPI   Follow-up liver cyst ultrasound Recent results showed normal liver tissue on [place], no sign of cirrhosis or other liver disease, had . 3.6 x 2.4 x 3.5 cm hypoechoic lesion in the anterior aspect of the left lobe of the liver on [place]. They recommended MRI. - She is doing well asymptomatic Past GI [facility] Dr Vicente Males 2019 colonoscopy Interested to pursue next imaging with MR   Chronic Pain Multiple Joints / Osteoarthritis Multiple joints (knees, back) Doing well currently Tramadol PRN can refill If need, 30 pill last 2 month, takes PRN  CHRONIC HTN/CKD-III Lab showed improved Cr to 1.17 at baseline, improved Current Meds -Lisinopril20mg  dailycurrently Reports good compliance, took meds today. Tolerating well, w/o complaints. Interval update, has had Renal US - demonstrated resolved hydronephrosis, prior nephrolithiasis s/p resolved. She has not had any further urinary problem. Denies CP, dyspnea, HA, edema, dizziness / lightheadedness   Health Maintenance: UTD COVID booster, Flu shot  Depression screen [facility] 2/9 11/20/2019 05/21/2019 11/18/2018  Decreased Interest 0 0 0  Down, Depressed, Hopeless 0 0 0  PHQ - 2 Score 0 0 0  Altered sleeping - - -  Tired, decreased energy - - -  Change in appetite - - -  Feeling bad or failure about yourself  - - -  Trouble concentrating - - -  Moving slowly or fidgety/restless - - -  Suicidal thoughts - - -  PHQ-9 Score - - -  Difficult doing work/chores - - -    Social History   Tobacco Use  . Smoking status: Never Smoker  . Smokeless tobacco: Never Used  Vaping Use  . Vaping Use: Never used  Substance Use Topics  . Alcohol use: No    Alcohol/week: 0.0 standard drinks  . Drug use: No    Review of Systems Per HPI unless specifically indicated  above     Objective:    BP 137/67   Pulse 76   Temp (!) 97.1 F (36.2 C)   Resp 16   Ht 5\' 3"  (1.6 m)   Wt 179 lb (81.2 kg)   SpO2 99%   BMI 31.71 kg/m   Wt Readings from Last 3 Encounters:  11/20/19 179 lb (81.2 kg)  08/10/19 180 lb (81.6 kg)  06/30/19 180 lb (81.6 kg)    Physical Exam Vitals and nursing note reviewed.  Constitutional:      General: She is not in acute distress.    Appearance: She is well-developed. She is not diaphoretic.     Comments: Well-appearing, comfortable, cooperative  HENT:     Head: Normocephalic and atraumatic.  Eyes:     General:        Right eye: No discharge.        Left eye: No discharge.     Conjunctiva/sclera: Conjunctivae normal.  Neck:     Thyroid: No thyromegaly.  Cardiovascular:     Rate and Rhythm: Normal rate and regular rhythm.     Heart sounds: Normal heart sounds. No murmur heard.   Pulmonary:     Effort: Pulmonary effort is normal. No respiratory distress.     Breath sounds: Normal breath sounds. No wheezing or rales.  Musculoskeletal:        General: Normal range of motion.  Cervical back: Normal range of motion and neck supple.  Lymphadenopathy:     Cervical: No cervical adenopathy.  Skin:    General: Skin is warm and dry.     Findings: No erythema or rash.  Neurological:     Mental Status: She is alert and oriented to person, place, and time.  Psychiatric:        Behavior: Behavior normal.     Comments: Well groomed, good eye contact, normal speech and thoughts       CLINICAL DATA:  Right lower quadrant abdominal pain  EXAM: CT ABDOMEN AND PELVIS WITHOUT CONTRAST  TECHNIQUE: Multidetector CT imaging of the abdomen and pelvis was performed following the standard protocol without IV contrast.  COMPARISON:  Radiograph 05/09/2017  FINDINGS: Lower chest: Bandlike areas of opacity in the lung bases likely reflect atelectasis and/or scarring. Small punctate calcified granuloma in the right middle  lobe (2/2). There is dense calcification of the mitral annulus and likely calcifications on the aortic leaflets. Normal cardiac size. No pericardial effusion.  Hepatobiliary: There is a 4.4 x 2.4 cm ovoid lesion arising from the anterior left lobe liver, segment 3 (2/21). This is incompletely characterized on this exam. No other concerning liver lesions. Smooth liver surface contour. Normal attenuation of the liver. There is a calcified gallstone within the largely collapsed gallbladder. No pericholecystic fluid or inflammation. No biliary ductal dilatation or calcified intraductal gallstones.  Pancreas: Partial fatty replacement. No pancreatic ductal dilatation or surrounding inflammatory changes.  Spleen: Normal in size without focal abnormality.  Adrenals/Urinary Tract: Normal adrenal glands. Asymmetric right perinephric and periureteral stranding with moderate hydroureteronephrosis to the level of a 3 mm calculus in the distal right ureter just proximal to the ureterovesicular junction (2/66). Small fluid attenuation cysts are present in both kidneys, largest in the left measuring up to 4 cm (2/28). No concerning renal lesion. Urinary bladder is largely decompressed at the time of exam and therefore poorly evaluated by CT imaging. No other gross bladder abnormality is seen.  Stomach/Bowel: Large hiatal hernia without evidence of obstruction given passage of contrast beyond this level. Distal stomach duodenum and small bowel are unremarkable. Contrast traverses to the level of the cecum. Question postsurgical changes along the cecum which may reflect prior appendectomy, correlate with surgical history. No colonic dilatation or wall thickening. Scattered colonic diverticula without focal pericolonic inflammation to suggest diverticulitis.  Vascular/Lymphatic: Atherosclerotic calcifications throughout the abdominal aorta and branch vessels. No aneurysm or ectasia.  No enlarged abdominopelvic lymph nodes.  Reproductive: Uterus is surgically absent. No concerning adnexal lesions.  Other: Small fat containing umbilical hernia. No abdominopelvic free fluid or free gas. No bowel containing hernias.  Musculoskeletal: No acute osseous abnormality or suspicious osseous lesion. Multilevel degenerative changes are present in the imaged portions of the spine. Levocurvature of the thoracolumbar junction, dextrocurvature of the lumbar spine. Mild lateral listhesis L4 on L5 with associated discogenic and facet degenerative changes at this level. No spondylolysis is evident. Additional degenerative changes in the hips and pelvis.  IMPRESSION: 1. Asymmetric right perinephric and periureteral stranding with moderate hydroureteronephrosis to the level of a 3 mm calculus in the distal right ureter just proximal to the ureterovesicular junction. 2. There is a 4.4 x 2.4 cm ovoid lesion arising from the anterior left lobe liver, segment 3 (2/21). This is incompletely characterized on this exam. Recommend further characterization with multiphase liver MRI. 3. Cholelithiasis without evidence of acute cholecystitis. 4. Large hiatal hernia without evidence of obstruction. 5. Colonic  diverticulosis without evidence of diverticulitis. 6. Aortic Atherosclerosis (ICD10-I70.0).   Electronically Signed   By: Lovena Le M.D.   On: 06/22/2019 02:22  --------------------------------------------------------------------------------    CLINICAL DATA:  84 year old female with history of liver lesion noted on prior CT examination. Follow-up study.  EXAM: ULTRASOUND ABDOMEN LIMITED RIGHT UPPER QUADRANT  COMPARISON:  No prior abdominal ultrasound. CT the abdomen and pelvis 06/22/2019.  FINDINGS: Gallbladder:  Large echogenic focus with posterior acoustic shadowing in the gallbladder fundus measuring 2.7 cm in diameter. Gallbladder is not distended.  Gallbladder wall thickness is normal at 2.4 mm. No pericholecystic fluid. Per report from the sonographer, there was no sonographic Murphy's sign on examination.  Common bile duct:  Diameter: 3.2 mm  Liver:  In the anterior aspect of the left lobe of the liver there is a 3.6 x 2.4 x 3.5 cm hypoechoic lesion with some increased through transmission. Within normal limits in parenchymal echogenicity. Portal vein is patent on color Doppler imaging with normal direction of blood flow towards the liver.  Other: None.  IMPRESSION: 1. 3.6 x 2.4 x 3.5 cm hypoechoic lesion in the anterior aspect of the left lobe of the liver, incompletely characterized on today's ultrasound examination. This may simply represent a proteinaceous cyst, however, definitive characterization of this lesion with abdominal MRI with and without IV gadolinium is once again recommended. 2. Cholelithiasis without evidence of acute cholecystitis at this time.   Electronically Signed   By: Vinnie Langton M.D.   On: 07/20/2019 16:10    Results for orders placed or performed in visit on 11/12/19  VITAMIN D 25 Hydroxy (Vit-D Deficiency, Fractures)  Result Value Ref Range   Vit D, 25-Hydroxy 60 30 - 100 ng/mL  Cancer antigen 27.29  Result Value Ref Range   CA 27.29 27 <38 U/mL  COMPLETE METABOLIC PANEL WITH GFR  Result Value Ref Range   Glucose, Bld 92 65 - 99 mg/dL   BUN 24 7 - 25 mg/dL   Creat 1.17 (H) 0.60 - 0.88 mg/dL   GFR, Est Non African American 44 (L) > OR = 60 mL/min/1.31m2   GFR, Est African American 51 (L) > OR = 60 mL/min/1.21m2   BUN/Creatinine Ratio 21 6 - 22 (calc)   Sodium 140 135 - 146 mmol/L   Potassium 4.5 3.5 - 5.3 mmol/L   Chloride 105 98 - 110 mmol/L   CO2 24 20 - 32 mmol/L   Calcium 10.2 8.6 - 10.4 mg/dL   Total Protein 6.3 6.1 - 8.1 g/dL   Albumin 4.0 3.6 - 5.1 g/dL   Globulin 2.3 1.9 - 3.7 g/dL (calc)   AG Ratio 1.7 1.0 - 2.5 (calc)   Total Bilirubin 0.8 0.2 - 1.2  mg/dL   Alkaline phosphatase (APISO) 75 37 - 153 U/L   AST 12 10 - 35 U/L   ALT 9 6 - 29 U/L  CBC with Differential/Platelet  Result Value Ref Range   WBC 4.3 3.8 - 10.8 Thousand/uL   RBC 4.24 3.80 - 5.10 Million/uL   Hemoglobin 13.1 11.7 - 15.5 g/dL   HCT 40.0 35 - 45 %   MCV 94.3 80.0 - 100.0 fL   MCH 30.9 27.0 - 33.0 pg   MCHC 32.8 32.0 - 36.0 g/dL   RDW 11.8 11.0 - 15.0 %   Platelets 177 140 - 400 Thousand/uL   MPV 11.0 7.5 - 12.5 fL   Neutro Abs 2,795 1,500 - 7,800 cells/uL   Lymphs Abs 1,114 850 -  3,900 cells/uL   Absolute Monocytes 271 200 - 950 cells/uL   Eosinophils Absolute 99 15.0 - 500.0 cells/uL   Basophils Absolute 22 0.0 - 200.0 cells/uL   Neutrophils Relative % 65 %   Total Lymphocyte 25.9 %   Monocytes Relative 6.3 %   Eosinophils Relative 2.3 %   Basophils Relative 0.5 %      Assessment & Plan:   Problem List Items Addressed This Visit    Liver cyst - Primary   History of breast cancer    Stable, without recurrence See HPI / overview for background on breast cancer Left sided, s/p partial mastectomy after biopsy excision, radiation, 1 yr hormonal therapy Arimidex  Last lab CA27.29 negative 10/2019 She would like to defer or decline mammogram and Oncology follow-up      CKD (chronic kidney disease) stage 3, GFR 30-59 ml/min (HCC)    Stable, chronic CKD-III  - Secondary to age, HTN, NSAIDs likely  Last lab improved Cr  Plan: 1. Continue intermittent Meloxicam 1-2 weeks on / off, avoid daily use 2. Improve hydration 3. Control BP F/u Cr trend q 6 mo      Benign hypertension with CKD (chronic kidney disease) stage III    Mostly controlled HTN - Home BP readings normal  Complication with CKD-III - Cr improved today OFF BB Metoprolol   Plan:  1. Continue Lisinopril 20mg  daily 2. Encourage improved lifestyle - low sodium diet, regular exercise 3. Continue monitor BP outside office, bring readings to next visit, if persistently >140/90 or new  symptoms notify office sooner 4. F/u 6 months  Offered ECHOcardiogram given heart murmur and ultimately we agree to defer for now, she is asymptomatic.       Other Visit Diagnoses    Liver disease       Relevant Orders   MR Abdomen W Wo Contrast      #Liver Cyst Originally identified on CT 05/2019 Repeat imaging done 06/2019 with Abdominal Ultrasound, see report, unable to fully characterize, recommended MR Abdomen w and wo contrast Order MR Abd today - discussed with patient, will be scheduled at [facility] outpatient [facility] center, f/u results, consider GI refer if indicated after - has seen [facility] in past 2019 for colonoscopy   Orders Placed This Encounter  Procedures  . MR Abdomen W Wo Contrast    Standing Status:   Future    Standing Expiration Date:   11/19/2020    Order Specific Question:   If indicated for the ordered procedure, I authorize the administration of contrast media per Radiology protocol    Answer:   Yes    Order Specific Question:   What is the patient's sedation requirement?    Answer:   No Sedation    Order Specific Question:   Does the patient have a pacemaker or implanted devices?    Answer:   No    Order Specific Question:   Preferred imaging location?    Answer:   [facility] (table limit-350lbs)     No orders of the defined types were placed in this encounter.     Follow up plan: Return in about 6 months (around 05/20/2020) for 6 month fasting lab onlythen 1 week later [facility].  Future labs ordered for 05/16/20  Nobie Putnam, [facility] 11/20/2019, 11:32 AM

## 2019-11-20 NOTE — Patient Instructions (Addendum)
Thank you for coming to the office today.  Stay tuned for MRI abdomen at [place]  Will forward you results and review them, if concerning we can refer to [facility] for consultation.\  Kidney function has improved back to baseline from prior.  DUE for FASTING BLOOD WORK (no food or drink after midnight before the lab appointment, only water or coffee without cream/sugar on the morning of)  SCHEDULE "Lab Only" visit in the morning at the clinic for lab draw in 6 MONTHS   - Make sure Lab Only appointment is at about 1 week before your next appointment, so that results will be available  For Lab Results, once available within 2-3 days of blood draw, you can can log in to MyChart online to view your results and a brief explanation. Also, we can discuss results at next follow-up visit.   Please schedule a Follow-up Appointment to: Return in about 6 months (around 05/20/2020) for 6 month fasting lab onlythen 1 week later [facility].  If you have any other questions or concerns, please feel free to call the office or send a message through [place]. You may also schedule an earlier appointment if necessary.  Additionally, you may be receiving a survey about your experience at our office within a few days to 1 week by e-mail or mail. We value your feedback.  Nobie Putnam, DO [place]

## 2019-11-20 NOTE — Assessment & Plan Note (Signed)
Mostly controlled HTN - Home BP readings normal  Complication with CKD-III - Cr improved today OFF BB Metoprolol   Plan:  1. Continue Lisinopril 20mg  daily 2. Encourage improved lifestyle - low sodium diet, regular exercise 3. Continue monitor BP outside office, bring readings to next visit, if persistently >140/90 or new symptoms notify office sooner 4. F/u 6 months  Offered ECHOcardiogram given heart murmur and ultimately we agree to defer for now, she is asymptomatic.

## 2019-11-20 NOTE — Assessment & Plan Note (Signed)
Stable, without recurrence See HPI / overview for background on breast cancer Left sided, s/p partial mastectomy after biopsy excision, radiation, 1 yr hormonal therapy Arimidex  Last lab CA27.29 negative 10/2019 She would like to defer or decline mammogram and Oncology follow-up

## 2019-11-24 ENCOUNTER — Ambulatory Visit: Payer: Medicare Other

## 2019-12-04 ENCOUNTER — Ambulatory Visit
Admission: RE | Admit: 2019-12-04 | Discharge: 2019-12-04 | Disposition: A | Discharge status: Home / Self Care | Payer: Medicare Other | Source: Ambulatory Visit | Attending: Family Medicine | Admitting: Family Medicine

## 2019-12-04 ENCOUNTER — Other Ambulatory Visit: Payer: Self-pay | Admitting: Family Medicine

## 2019-12-04 ENCOUNTER — Other Ambulatory Visit: Payer: Self-pay

## 2019-12-04 DIAGNOSIS — K769 Liver disease, unspecified: Secondary | ICD-10-CM | POA: Diagnosis not present

## 2019-12-04 DIAGNOSIS — K8020 Calculus of gallbladder without cholecystitis without obstruction: Secondary | ICD-10-CM | POA: Diagnosis not present

## 2019-12-04 DIAGNOSIS — N281 Cyst of kidney, acquired: Secondary | ICD-10-CM | POA: Diagnosis not present

## 2019-12-04 DIAGNOSIS — K7689 Other specified diseases of liver: Secondary | ICD-10-CM | POA: Diagnosis not present

## 2019-12-04 DIAGNOSIS — I10 Essential (primary) hypertension: Secondary | ICD-10-CM

## 2019-12-04 DIAGNOSIS — K449 Diaphragmatic hernia without obstruction or gangrene: Secondary | ICD-10-CM | POA: Diagnosis not present

## 2019-12-04 MED ORDER — GADOBUTROL 1 MMOL/ML IV SOLN
8.0000 mL | Freq: Once | INTRAVENOUS | Status: AC | PRN
  Administered 2019-12-04: 8 mL via INTRAVENOUS

## 2019-12-08 ENCOUNTER — Ambulatory Visit (INDEPENDENT_AMBULATORY_CARE_PROVIDER_SITE_OTHER): Payer: Medicare Other

## 2019-12-08 VITALS — Ht 64.0 in | Wt 179.0 lb

## 2019-12-08 DIAGNOSIS — Z0000 Encounter for general adult medical examination without abnormal findings: Secondary | ICD-10-CM | POA: Diagnosis not present

## 2019-12-08 NOTE — Patient Instructions (Signed)
Angela Bullock , Thank you for taking time to come for your Medicare Wellness Visit. I appreciate your ongoing commitment to your health goals. Please review the following plan we discussed and let me know if I can assist you in the future.   Screening recommendations/referrals: Colonoscopy: not required Mammogram: not required Bone Density: completed 10/17/2016, due 10/17/2021 Recommended yearly ophthalmology/optometry visit for glaucoma screening and checkup Recommended yearly dental visit for hygiene and checkup  Vaccinations: Influenza vaccine: completed 10/29/2019, due 08/29/2020 Pneumococcal vaccine: completed 01/14/2013 Tdap vaccine: completed 05/21/2013, due 05/22/2023 Shingles vaccine:  discussed   Covid-19: 11/04/2019, 03/05/2019, 02/12/2019  Advanced directives: Please bring a copy of your POA (Power of Attorney) and/or Living Will to your next appointment.   Conditions/risks identified: none  Next appointment: Follow up in one year for your annual wellness visit    Preventive Care 65 Years and Older, Female Preventive care refers to lifestyle choices and visits with your health care provider that can promote health and wellness. What does preventive care include?  A yearly physical exam. This is also called an annual well check.  Dental exams once or twice a year.  Routine eye exams. Ask your health care provider how often you should have your eyes checked.  Personal lifestyle choices, including:  Daily care of your teeth and gums.  Regular physical activity.  Eating a healthy diet.  Avoiding tobacco and drug use.  Limiting alcohol use.  Practicing safe sex.  Taking low-dose aspirin every day.  Taking vitamin and mineral supplements as recommended by your health care provider. What happens during an annual well check? The services and screenings done by your health care provider during your annual well check will depend on your age, overall health, lifestyle risk  factors, and family history of disease. Counseling  Your health care provider may ask you questions about your:  Alcohol use.  Tobacco use.  Drug use.  Emotional well-being.  Home and relationship well-being.  Sexual activity.  Eating habits.  History of falls.  Memory and ability to understand (cognition).  Work and work Statistician.  Reproductive health. Screening  You may have the following tests or measurements:  Height, weight, and BMI.  Blood pressure.  Lipid and cholesterol levels. These may be checked every 5 years, or more frequently if you are over 64 years old.  Skin check.  Lung cancer screening. You may have this screening every year starting at age 55 if you have a 30-pack-year history of smoking and currently smoke or have quit within the past 15 years.  Fecal occult blood test (FOBT) of the stool. You may have this test every year starting at age 82.  Flexible sigmoidoscopy or colonoscopy. You may have a sigmoidoscopy every 5 years or a colonoscopy every 10 years starting at age 36.  Hepatitis C blood test.  Hepatitis B blood test.  Sexually transmitted disease (STD) testing.  Diabetes screening. This is done by checking your blood sugar (glucose) after you have not eaten for a while (fasting). You may have this done every 1-3 years.  Bone density scan. This is done to screen for osteoporosis. You may have this done starting at age 107.  Mammogram. This may be done every 1-2 years. Talk to your health care provider about how often you should have regular mammograms. Talk with your health care provider about your test results, treatment options, and if necessary, the need for more tests. Vaccines  Your health care provider may recommend certain vaccines, such  as:  Influenza vaccine. This is recommended every year.  Tetanus, diphtheria, and acellular pertussis (Tdap, Td) vaccine. You may need a Td booster every 10 years.  Zoster vaccine. You  may need this after age 72.  Pneumococcal 13-valent conjugate (PCV13) vaccine. One dose is recommended after age 82.  Pneumococcal polysaccharide (PPSV23) vaccine. One dose is recommended after age 60. Talk to your health care provider about which screenings and vaccines you need and how often you need them. This information is not intended to replace advice given to you by your health care provider. Make sure you discuss any questions you have with your health care provider. Document Released: 02/11/2015 Document Revised: 10/05/2015 Document Reviewed: 11/16/2014 Elsevier Interactive Patient Education  2017 [place] Prevention in the Home Falls can cause injuries. They can happen to people of all ages. There are many things you can do to make your home safe and to help prevent falls. What can I do on the outside of my home?  Regularly fix the edges of walkways and driveways and fix any cracks.  Remove anything that might make you trip as you walk through a door, such as a raised step or threshold.  Trim any bushes or trees on the path to your home.  Use bright outdoor lighting.  Clear any walking paths of anything that might make someone trip, such as rocks or tools.  Regularly check to see if handrails are loose or broken. Make sure that both sides of any steps have handrails.  Any raised decks and porches should have guardrails on the edges.  Have any leaves, snow, or ice cleared regularly.  Use sand or salt on walking paths during winter.  Clean up any spills in your garage right away. This includes oil or grease spills. What can I do in the bathroom?  Use night lights.  Install grab bars by the toilet and in the tub and shower. Do not use towel bars as grab bars.  Use non-skid mats or decals in the tub or shower.  If you need to sit down in the shower, use a plastic, non-slip stool.  Keep the floor dry. Clean up any water that spills on the floor as soon as  it happens.  Remove soap buildup in the tub or shower regularly.  Attach bath mats securely with double-sided non-slip rug tape.  Do not have throw rugs and other things on the floor that can make you trip. What can I do in the bedroom?  Use night lights.  Make sure that you have a light by your bed that is easy to reach.  Do not use any sheets or blankets that are too big for your bed. They should not hang down onto the floor.  Have a firm chair that has side arms. You can use this for support while you get dressed.  Do not have throw rugs and other things on the floor that can make you trip. What can I do in the kitchen?  Clean up any spills right away.  Avoid walking on wet floors.  Keep items that you use a lot in easy-to-reach places.  If you need to reach something above you, use a strong step stool that has a grab bar.  Keep electrical cords out of the way.  Do not use floor polish or wax that makes floors slippery. If you must use wax, use non-skid floor wax.  Do not have throw rugs and other things on the  floor that can make you trip. What can I do with my stairs?  Do not leave any items on the stairs.  Make sure that there are handrails on both sides of the stairs and use them. Fix handrails that are broken or loose. Make sure that handrails are as long as the stairways.  Check any carpeting to make sure that it is firmly attached to the stairs. Fix any carpet that is loose or worn.  Avoid having throw rugs at the top or bottom of the stairs. If you do have throw rugs, attach them to the floor with carpet tape.  Make sure that you have a light switch at the top of the stairs and the bottom of the stairs. If you do not have them, ask someone to add them for you. What else can I do to help prevent falls?  Wear shoes that:  Do not have high heels.  Have rubber bottoms.  Are comfortable and fit you well.  Are closed at the toe. Do not wear sandals.  If  you use a stepladder:  Make sure that it is fully opened. Do not climb a closed stepladder.  Make sure that both sides of the stepladder are locked into place.  Ask someone to hold it for you, if possible.  Clearly mark and make sure that you can see:  Any grab bars or handrails.  First and last steps.  Where the edge of each step is.  Use tools that help you move around (mobility aids) if they are needed. These include:  Canes.  Walkers.  Scooters.  Crutches.  Turn on the lights when you go into a dark area. Replace any light bulbs as soon as they burn out.  Set up your furniture so you have a clear path. Avoid moving your furniture around.  If any of your floors are uneven, fix them.  If there are any pets around you, be aware of where they are.  Review your medicines with your doctor. Some medicines can make you feel dizzy. This can increase your chance of falling. Ask your doctor what other things that you can do to help prevent falls. This information is not intended to replace advice given to you by your health care provider. Make sure you discuss any questions you have with your health care provider. Document Released: 11/11/2008 Document Revised: 06/23/2015 Document Reviewed: 02/19/2014 Elsevier Interactive Patient Education  2017 [employer].

## 2019-12-08 NOTE — Progress Notes (Signed)
I connected with Angela Bullock today by telephone and verified that I am speaking with the correct person using two identifiers. Location patient: home Location provider: work Persons participating in the virtual visit: Cosette Balke, Glenna Durand LPN.   I discussed the limitations, risks, security and privacy concerns of performing an evaluation and management service by telephone and the availability of in person appointments. I also discussed with the patient that there may be a patient responsible charge related to this service. The patient expressed understanding and verbally consented to this telephonic visit.    Interactive audio and video telecommunications were attempted between this provider and patient, however failed, due to patient having technical difficulties OR patient did not have access to video capability.  We continued and completed visit with audio only.     Vital signs may be patient reported or missing.  Subjective:   Angela Bullock is a 84 y.o. female who presents for Medicare Annual (Subsequent) preventive examination.  Review of Systems     Cardiac Risk Factors include: advanced age (>69men, >55 women);hypertension;dyslipidemia;obesity (BMI >30kg/m2);sedentary lifestyle     Objective:    Today's Vitals   12/08/19 1042  Weight: 179 lb (81.2 kg)  Height: 5\' 4"  (1.626 m)   Body mass index is 30.73 kg/m.  Advanced Directives 12/08/2019 06/21/2019 11/18/2018 09/29/2018 12/02/2017 08/13/2017 07/23/2017  Does Patient Have a Medical Advance Directive? Yes No Yes No No Yes Yes  Type of Advance Directive [place];Living will [place];Living will  Does patient want to make changes to medical advance directive? - - - - - - -  Copy of [place] in Chart? No - copy requested - No - copy requested - - No - copy requested No - copy requested    Would patient like information on creating a medical advance directive? - No - Patient declined - No - Patient declined No - Patient declined - -    Current Medications (verified) Outpatient Encounter Medications as of 12/08/2019  Medication Sig  . aspirin EC 325 MG tablet Take 325 mg by mouth daily. As needed   . atorvastatin (LIPITOR) 10 MG tablet TAKE 1 TABLET(10 MG) BY MOUTH AT BEDTIME  . baclofen (LIORESAL) 10 MG tablet TAKE 1/2 TO 1 TABLET(5 TO 10 MG) BY MOUTH TWICE DAILY AS NEEDED FOR MUSCLE SPASMS  . Biotin 10 MG CAPS Take 10 mg by mouth daily.  . calcium carbonate (OSCAL) 1500 (600 CA) MG TABS tablet Take 1 tablet by mouth every other day.  . cholecalciferol (VITAMIN D) 1000 UNITS tablet Take 1,000 Units by mouth daily.  Marland Kitchen co-enzyme Q-10 50 MG capsule Take 50 mg by mouth daily.  Marland Kitchen glucosamine-chondroitin 500-400 MG tablet Take 1 tablet by mouth 3 (three) times daily.  Marland Kitchen lisinopril (ZESTRIL) 20 MG tablet TAKE 1 TABLET(20 MG) BY MOUTH DAILY  . meloxicam (MOBIC) 15 MG tablet TAKE 1 TABLET BY MOUTH EVERY DAY AS NEEDED FOR PAIN. PREFERRED TO TAKE UP TO 1 TO 2 WEEKS AT A TIME THEN STOP.  . Multiple Vitamins-Minerals (CENTRUM SILVER PO) Take 1 tablet by mouth daily.  . Omega-3 Krill Oil 300 MG CAPS Take 1 capsule by mouth daily.  Marland Kitchen Resveratrol 250 MG CAPS   . traMADol (ULTRAM) 50 MG tablet Take 1 tablet (50 mg total) by mouth every 8 (eight) hours as needed for moderate pain or severe pain.  Marland Kitchen  Calcium Carbonate-Vitamin D 600-200 MG-UNIT TABS Take by mouth. (Patient not taking: Reported on 12/08/2019)   No facility-administered encounter medications on file as of 12/08/2019.    Allergies (verified) Penicillins   History: Past Medical History:  Diagnosis Date  . Allergy   . Breast cancer ([place]) 01/30/2012   left, radiation and lumpectomy  . Hypertension   . Personal history of radiation therapy    Past Surgical History:  Procedure Laterality Date  . ABDOMINAL HYSTERECTOMY    .  BREAST BIOPSY Left 01/30/2011   negative  . BREAST BIOPSY Left 01/30/2012   positive  . BREAST CYST ASPIRATION Left   . BREAST LUMPECTOMY Left 04/02/2012   positive  . COLONOSCOPY WITH PROPOFOL N/A 07/23/2017   Procedure: COLONOSCOPY WITH PROPOFOL WITH BIOPSIES;  Surgeon: Jonathon Bellows, MD;  [facility] ENDOSCOPY;  Service: Gastroenterology;  Laterality: N/A;   Family History  Problem Relation Age of Onset  . Breast cancer Paternal Aunt 85  . Osteoporosis Mother   . Heart attack Father    Social History   Socioeconomic History  . Marital status: Married    Spouse name: Not on file  . Number of children: Not on file  . Years of education: 50  . Highest education level: High school graduate  Occupational History  . Not on file  Tobacco Use  . Smoking status: Never Smoker  . Smokeless tobacco: Never Used  Vaping Use  . Vaping Use: Never used  Substance and Sexual Activity  . Alcohol use: No    Alcohol/week: 0.0 standard drinks  . Drug use: No  . Sexual activity: Yes  Other Topics Concern  . Not on file  Social History Narrative  . Not on file   Social Determinants of Health   Financial Resource Strain: Low Risk   . Difficulty of Paying Living Expenses: Not hard at all  Food Insecurity: No Food Insecurity  . Worried About Charity fundraiser in the Last Year: Never true  . Ran Out of Food in the Last Year: Never true  Transportation Needs: No Transportation Needs  . Lack of Transportation (Medical): No  . Lack of Transportation (Non-Medical): No  Physical Activity: Insufficiently Active  . Days of Exercise per Week: 7 days  . Minutes of Exercise per Session: 10 min  Stress: Stress Concern Present  . Feeling of Stress : To some extent  Social Connections:   . Frequency of Communication with Friends and Family: Not on file  . Frequency of Social Gatherings with Friends and Family: Not on file  . Attends Religious Services: Not on file  . Active Member of Clubs or  Organizations: Not on file  . Attends Archivist Meetings: Not on file  . Marital Status: Not on file    Tobacco Counseling Counseling given: Not Answered   Clinical Intake:  Pre-visit preparation completed: Yes  Pain : No/denies pain     Nutritional Status: BMI > 30  Obese Nutritional Risks: None Diabetes: No  How often do you need to have someone help you when you read instructions, pamphlets, or other written materials from your doctor or pharmacy?: 1 - Never What is the last grade level you completed in school?: 13 yrs  Diabetic? no  Interpreter Needed?: No  Information entered by :: NAllen LPN   Activities of Daily Living In your present state of health, do you have any difficulty performing the following activities: 12/08/2019 11/20/2019  Hearing? N N  Vision? N  N  Difficulty concentrating or making decisions? Y N  Comment sometimes -  Walking or climbing stairs? Y N  Dressing or bathing? N N  Doing errands, shopping? N N  Preparing Food and eating ? N -  Using the Toilet? N -  In the past six months, have you accidently leaked urine? N -  Do you have problems with loss of bowel control? N -  Managing your Medications? N -  Managing your Finances? N -  Housekeeping or managing your Housekeeping? N -  Some recent data might be hidden    Patient Care Team: Olin Hauser, DO as PCP - General (Family Medicine) Lequita Asal, MD as Referring Physician (Hematology and Oncology) Minor, Dalbert Garnet, RN (Inactive) as [place] any recent [place] you may have received from other than [facility] providers in the past year (date may be approximate).     Assessment:   This is a routine wellness examination for [place].  Hearing/Vision screen  Hearing Screening   125Hz  250Hz  500Hz  1000Hz  2000Hz  3000Hz  4000Hz  6000Hz  8000Hz   Right ear:           Left ear:           Vision Screening Comments: Regular  eye exams, [place]  Dietary issues and exercise activities discussed: Current Exercise Habits: Home exercise routine, Type of exercise: walking, Time (Minutes): 10, Frequency (Times/Week): 7, Weekly Exercise (Minutes/Week): 70  Goals    . DIET - INCREASE WATER INTAKE     Recommend drinking at least 6-8 glasses of water a day     . Increase water intake     Recommend drinking at least 4-5 glasses of water a day    . Patient Stated     12/08/2019, take a day at a time      Depression Screen PHQ 2/9 Scores 12/08/2019 11/20/2019 05/21/2019 11/18/2018 03/17/2018 09/11/2017 08/13/2017  PHQ - 2 Score 0 0 0 0 0 0 0  PHQ- 9 Score - - - - - - -    Fall Risk Fall Risk  12/08/2019 11/20/2019 05/21/2019 11/18/2018 11/18/2018  Falls in the past year? 0 0 0 0 1  Number falls in past yr: - 0 0 - 0  Injury with Fall? - 0 0 - 0  Risk for fall due to : Medication side effect - - - -  Follow up Falls evaluation completed;Education provided;Falls prevention discussed Falls evaluation completed Falls evaluation completed - -    Any stairs in or around the home? No  If so, are there any without handrails? n/a Home free of loose throw rugs in walkways, pet beds, electrical cords, etc? Yes  Adequate lighting in your home to reduce risk of falls? Yes   ASSISTIVE DEVICES UTILIZED TO PREVENT FALLS:  Life alert? Yes  Use of a cane, walker or w/c? No  Grab bars in the bathroom? Yes  Shower chair or bench in shower? Yes  Elevated toilet seat or a handicapped toilet? Yes   TIMED UP AND GO:  Was the test performed? No .     Cognitive Function:     6CIT Screen 12/08/2019 11/18/2018 08/13/2017 07/31/2016  What Year? 0 points 0 points 0 points 0 points  What month? 0 points 0 points 0 points 0 points  What time? 0 points 0 points 0 points 0 points  Count back from 20 0 points 0 points 0 points 0 points  Months in  reverse 0 points 0 points 0 points 0 points  Repeat phrase 0 points 0 points 0  points 0 points  Total Score 0 0 0 0    Immunizations Immunization History  Administered Date(s) Administered  . Fluad Quad(high Dose 65+) 10/02/2018  . Influenza, High Dose Seasonal PF 10/22/2014, 11/07/2017  . Influenza, Seasonal, Injecte, Preservative Fre 10/24/2012  . Influenza-Unspecified 10/24/2015, 10/29/2019  . PFIZER SARS-COV-2 Vaccination 02/12/2019, 03/05/2019, 11/04/2019  . PPD Test 06/16/2019  . Pneumococcal Conjugate-13 01/14/2013  . Tdap 05/21/2013  . Zoster 01/30/2012    TDAP status: Up to date Flu Vaccine status: Up to date Pneumococcal vaccine status: Up to date Covid-19 vaccine status: Completed vaccines  Qualifies for Shingles Vaccine? Yes   Zostavax completed Yes   Shingrix Completed?: No.    Education has been provided regarding the importance of this vaccine. Patient has been advised to call insurance company to determine out of pocket expense if they have not yet received this vaccine. Advised may also receive vaccine at local pharmacy or Health Dept. Verbalized acceptance and understanding.  Screening Tests Health Maintenance  Topic Date Due  . TETANUS/TDAP  05/22/2023  . INFLUENZA VACCINE  Completed  . DEXA SCAN  Completed  . COVID-19 Vaccine  Completed  . PNA vac Low Risk Adult  Completed    Health Maintenance  There are no preventive care reminders to display for this patient.  Colorectal cancer screening: No longer required.  Mammogram status: No longer required.  Bone Density status: Completed 10/17/2016. Results reflect: Bone density results: OSTEOPENIA. Repeat every 5 years.  Lung Cancer Screening: (Low Dose CT Chest recommended if Age 63-80 years, 30 pack-year currently smoking OR have quit w/in 15years.) does not qualify.   Lung Cancer Screening Referral: no  Additional Screening:  Hepatitis C Screening: does not qualify;   Vision Screening: Recommended annual ophthalmology exams for early detection of glaucoma and other disorders  of the eye. Is the patient up to date with their annual eye exam?  Yes  Who is the provider or what is the name of the office in which the patient attends annual eye exams? [facility] If pt is not established with a provider, would they like to be referred to a provider to establish care? No .   Dental Screening: Recommended annual dental exams for proper oral hygiene  Community Resource Referral / Chronic Care Management: CRR required this visit?  No   CCM required this visit?  No      Plan:     I have personally reviewed and noted the following in the patient's chart:   . Medical and social history . Use of alcohol, tobacco or illicit drugs  . Current medications and supplements . Functional ability and status . Nutritional status . Physical activity . Advanced directives . List of other physicians . Hospitalizations, surgeries, and ER visits in previous 12 months . Vitals . Screenings to include cognitive, depression, and falls . Referrals and appointments  In addition, I have reviewed and discussed with patient certain preventive protocols, quality metrics, and best practice recommendations. A written personalized care plan for preventive services as well as general preventive health recommendations were provided to patient.     Kellie Simmering, LPN   [id number]/04/[id number]   Nurse Notes:

## 2019-12-31 ENCOUNTER — Ambulatory Visit: Payer: Medicare Other | Admitting: Podiatry

## 2020-01-06 ENCOUNTER — Other Ambulatory Visit: Payer: Self-pay | Admitting: Family Medicine

## 2020-01-06 DIAGNOSIS — E785 Hyperlipidemia, unspecified: Secondary | ICD-10-CM

## 2020-02-18 ENCOUNTER — Ambulatory Visit: Payer: Medicare Other | Admitting: Podiatry

## 2020-03-20 ENCOUNTER — Other Ambulatory Visit: Payer: Self-pay | Admitting: Family Medicine

## 2020-03-20 DIAGNOSIS — G8929 Other chronic pain: Secondary | ICD-10-CM

## 2020-03-20 DIAGNOSIS — M159 Polyosteoarthritis, unspecified: Secondary | ICD-10-CM

## 2020-03-20 DIAGNOSIS — M8949 Other hypertrophic osteoarthropathy, multiple sites: Secondary | ICD-10-CM

## 2020-04-11 ENCOUNTER — Other Ambulatory Visit: Payer: Self-pay | Admitting: Family Medicine

## 2020-04-11 DIAGNOSIS — M159 Polyosteoarthritis, unspecified: Secondary | ICD-10-CM

## 2020-04-11 DIAGNOSIS — G8929 Other chronic pain: Secondary | ICD-10-CM

## 2020-04-11 DIAGNOSIS — M545 Low back pain, unspecified: Secondary | ICD-10-CM

## 2020-04-11 DIAGNOSIS — M8949 Other hypertrophic osteoarthropathy, multiple sites: Secondary | ICD-10-CM

## 2020-04-11 NOTE — Telephone Encounter (Signed)
Requested medication (s) are due for refill today: yes  Requested medication (s) are on the active medication list: yes  Last refill: 12/22/19  Future visit scheduled: yes  Notes to clinic: not delegated    Requested Prescriptions  Pending Prescriptions Disp Refills   traMADol (ULTRAM) 50 MG tablet [Pharmacy Med Name: TRAMADOL 50MG  TABLETS] 30 tablet     Sig: TAKE 1 TABLET(50 MG) BY MOUTH EVERY 8 HOURS AS NEEDED FOR MODERATE PAIN OR SEVERE PAIN      Not Delegated - Analgesics:  Opioid Agonists Failed - 04/11/2020 12:34 AM      Failed - This refill cannot be delegated      Failed - Urine Drug Screen completed in last 360 days      Passed - Valid encounter within last 6 months    Recent Outpatient Visits           4 months ago Liver cyst   [facility] Olin Hauser, DO   8 months ago Benign hypertension with CKD (chronic kidney disease) stage III   [place], Devonne Doughty, DO   9 months ago Benign hypertension with CKD (chronic kidney disease) stage III   [place], DO   10 months ago Benign hypertension with CKD (chronic kidney disease) stage III   [place], DO   1 year ago Benign hypertension with CKD (chronic kidney disease) stage III   [place], Devonne Doughty, DO       Future Appointments             In 1 month [place], [facility], [place]   In 8 months  [facility], [facility]

## 2020-05-16 ENCOUNTER — Other Ambulatory Visit: Payer: Medicare Other

## 2020-05-19 ENCOUNTER — Other Ambulatory Visit: Payer: Self-pay | Admitting: Family Medicine

## 2020-05-19 DIAGNOSIS — I10 Essential (primary) hypertension: Secondary | ICD-10-CM

## 2020-05-19 NOTE — Telephone Encounter (Signed)
Requested Prescriptions  Pending Prescriptions Disp Refills  . lisinopril (ZESTRIL) 20 MG tablet [Pharmacy Med Name: LISINOPRIL 20MG  TABLETS] 30 tablet 0    Sig: TAKE 1 TABLET(20 MG) BY MOUTH DAILY     Cardiovascular:  ACE Inhibitors Failed - 05/19/2020  7:15 AM      Failed - Cr in normal range and within 180 days    Creat  Date Value Ref Range Status  11/13/2019 1.17 (H) 0.60 - 0.88 mg/dL Final    Comment:    For patients >83 years of age, the reference limit for Creatinine is approximately 13% higher for people identified as African-American. .          Failed - K in normal range and within 180 days    Potassium  Date Value Ref Range Status  11/13/2019 4.5 3.5 - 5.3 mmol/L Final  10/13/2013 4.3 3.5 - 5.1 mmol/L Final         Failed - Valid encounter within last 6 months    Recent Outpatient Visits          6 months ago Liver cyst   [place], DO   9 months ago Benign hypertension with CKD (chronic kidney disease) stage III   [facility] [place], Devonne Doughty, DO   10 months ago Benign hypertension with CKD (chronic kidney disease) stage III   [place], DO   12 months ago Benign hypertension with CKD (chronic kidney disease) stage III   [place], DO   1 year ago Benign hypertension with CKD (chronic kidney disease) stage III   [place], DO      Future Appointments            In 2 weeks [place], Devonne Doughty, DO [facility], [place]   In 6 months  [facility], [place] - Patient is not pregnant      Passed - Last BP in normal range    BP Readings from Last 1 Encounters:  11/20/19 137/67

## 2020-05-20 DIAGNOSIS — C441121 Basal cell carcinoma of skin of right upper eyelid, including canthus: Secondary | ICD-10-CM | POA: Diagnosis not present

## 2020-05-20 DIAGNOSIS — D225 Melanocytic nevi of trunk: Secondary | ICD-10-CM | POA: Diagnosis not present

## 2020-05-20 DIAGNOSIS — D2262 Melanocytic nevi of left upper limb, including shoulder: Secondary | ICD-10-CM | POA: Diagnosis not present

## 2020-05-20 DIAGNOSIS — D2261 Melanocytic nevi of right upper limb, including shoulder: Secondary | ICD-10-CM | POA: Diagnosis not present

## 2020-05-20 DIAGNOSIS — D485 Neoplasm of uncertain behavior of skin: Secondary | ICD-10-CM | POA: Diagnosis not present

## 2020-05-20 DIAGNOSIS — X32XXXA Exposure to sunlight, initial encounter: Secondary | ICD-10-CM | POA: Diagnosis not present

## 2020-05-20 DIAGNOSIS — L720 Epidermal cyst: Secondary | ICD-10-CM | POA: Diagnosis not present

## 2020-05-20 DIAGNOSIS — L570 Actinic keratosis: Secondary | ICD-10-CM | POA: Diagnosis not present

## 2020-05-20 DIAGNOSIS — D2272 Melanocytic nevi of left lower limb, including hip: Secondary | ICD-10-CM | POA: Diagnosis not present

## 2020-05-23 ENCOUNTER — Encounter: Payer: Medicare Other | Admitting: Family Medicine

## 2020-05-25 ENCOUNTER — Other Ambulatory Visit: Payer: Self-pay

## 2020-05-25 ENCOUNTER — Other Ambulatory Visit: Payer: Medicare Other

## 2020-05-25 DIAGNOSIS — Z853 Personal history of malignant neoplasm of breast: Secondary | ICD-10-CM

## 2020-05-25 DIAGNOSIS — R7309 Other abnormal glucose: Secondary | ICD-10-CM

## 2020-05-25 DIAGNOSIS — Z0000 Encounter for general adult medical examination without abnormal findings: Secondary | ICD-10-CM | POA: Diagnosis not present

## 2020-05-25 DIAGNOSIS — E559 Vitamin D deficiency, unspecified: Secondary | ICD-10-CM | POA: Diagnosis not present

## 2020-05-25 DIAGNOSIS — I129 Hypertensive chronic kidney disease with stage 1 through stage 4 chronic kidney disease, or unspecified chronic kidney disease: Secondary | ICD-10-CM | POA: Diagnosis not present

## 2020-05-25 DIAGNOSIS — N183 Chronic kidney disease, stage 3 unspecified: Secondary | ICD-10-CM | POA: Diagnosis not present

## 2020-05-25 DIAGNOSIS — N1831 Chronic kidney disease, stage 3a: Secondary | ICD-10-CM | POA: Diagnosis not present

## 2020-05-25 DIAGNOSIS — E782 Mixed hyperlipidemia: Secondary | ICD-10-CM

## 2020-05-26 LAB — CBC WITH DIFFERENTIAL/PLATELET
Absolute Monocytes: 307 cells/uL (ref 200–950)
Basophils Absolute: 21 cells/uL (ref 0–200)
Basophils Relative: 0.5 %
Eosinophils Absolute: 71 cells/uL (ref 15–500)
Eosinophils Relative: 1.7 %
HCT: 41.6 % (ref 35.0–45.0)
Hemoglobin: 13.7 g/dL (ref 11.7–15.5)
Lymphs Abs: 815 cells/uL — ABNORMAL LOW (ref 850–3900)
MCH: 30.6 pg (ref 27.0–33.0)
MCHC: 32.9 g/dL (ref 32.0–36.0)
MCV: 93.1 fL (ref 80.0–100.0)
MPV: 11.3 fL (ref 7.5–12.5)
Monocytes Relative: 7.3 %
Neutro Abs: 2986 cells/uL (ref 1500–7800)
Neutrophils Relative %: 71.1 %
Platelets: 188 10*3/uL (ref 140–400)
RBC: 4.47 10*6/uL (ref 3.80–5.10)
RDW: 11.9 % (ref 11.0–15.0)
Total Lymphocyte: 19.4 %
WBC: 4.2 10*3/uL (ref 3.8–10.8)

## 2020-05-26 LAB — COMPLETE METABOLIC PANEL WITH GFR
AG Ratio: 1.8 (calc) (ref 1.0–2.5)
ALT: 12 U/L (ref 6–29)
AST: 15 U/L (ref 10–35)
Albumin: 4.2 g/dL (ref 3.6–5.1)
Alkaline phosphatase (APISO): 87 U/L (ref 37–153)
BUN/Creatinine Ratio: 22 (calc) (ref 6–22)
BUN: 21 mg/dL (ref 7–25)
CO2: 25 mmol/L (ref 20–32)
Calcium: 10.4 mg/dL (ref 8.6–10.4)
Chloride: 107 mmol/L (ref 98–110)
Creat: 0.97 mg/dL — ABNORMAL HIGH (ref 0.60–0.88)
GFR, Est African American: 63 mL/min/{1.73_m2} (ref >=60)
GFR, Est Non African American: 54 mL/min/{1.73_m2} — ABNORMAL LOW (ref >=60)
Globulin: 2.4 g/dL (calc) (ref 1.9–3.7)
Glucose, Bld: 87 mg/dL (ref 65–99)
Potassium: 4.3 mmol/L (ref 3.5–5.3)
Sodium: 141 mmol/L (ref 135–146)
Total Bilirubin: 0.8 mg/dL (ref 0.2–1.2)
Total Protein: 6.6 g/dL (ref 6.1–8.1)

## 2020-05-26 LAB — VITAMIN D 25 HYDROXY (VIT D DEFICIENCY, FRACTURES): Vit D, 25-Hydroxy: 56 ng/mL (ref 30–100)

## 2020-05-26 LAB — CANCER ANTIGEN 27.29: CA 27.29: 31 U/mL (ref <38)

## 2020-05-26 LAB — LIPID PANEL
Cholesterol: 167 mg/dL (ref <200)
HDL: 67 mg/dL (ref >=50)
LDL Cholesterol (Calc): 79 mg/dL (calc)
Non-HDL Cholesterol (Calc): 100 mg/dL (calc) (ref <130)
Total CHOL/HDL Ratio: 2.5 (calc) (ref <5.0)
Triglycerides: 110 mg/dL (ref <150)

## 2020-05-26 LAB — HEMOGLOBIN A1C
Hgb A1c MFr Bld: 5.4 % of total Hgb (ref <5.7)
Mean Plasma Glucose: 108 mg/dL
eAG (mmol/L): 6 mmol/L

## 2020-05-26 LAB — TSH: TSH: 1.75 mIU/L (ref 0.40–4.50)

## 2020-06-02 ENCOUNTER — Encounter: Payer: Self-pay | Admitting: Family Medicine

## 2020-06-02 ENCOUNTER — Other Ambulatory Visit: Payer: Self-pay

## 2020-06-02 ENCOUNTER — Ambulatory Visit (INDEPENDENT_AMBULATORY_CARE_PROVIDER_SITE_OTHER): Payer: Medicare Other | Admitting: Family Medicine

## 2020-06-02 VITALS — BP 133/82 | HR 97 | Ht 64.0 in | Wt 179.0 lb

## 2020-06-02 DIAGNOSIS — E785 Hyperlipidemia, unspecified: Secondary | ICD-10-CM | POA: Diagnosis not present

## 2020-06-02 DIAGNOSIS — K7689 Other specified diseases of liver: Secondary | ICD-10-CM

## 2020-06-02 DIAGNOSIS — Z853 Personal history of malignant neoplasm of breast: Secondary | ICD-10-CM | POA: Diagnosis not present

## 2020-06-02 DIAGNOSIS — E559 Vitamin D deficiency, unspecified: Secondary | ICD-10-CM

## 2020-06-02 DIAGNOSIS — N183 Chronic kidney disease, stage 3 unspecified: Secondary | ICD-10-CM

## 2020-06-02 DIAGNOSIS — E782 Mixed hyperlipidemia: Secondary | ICD-10-CM | POA: Diagnosis not present

## 2020-06-02 DIAGNOSIS — N1831 Chronic kidney disease, stage 3a: Secondary | ICD-10-CM | POA: Diagnosis not present

## 2020-06-02 DIAGNOSIS — M8949 Other hypertrophic osteoarthropathy, multiple sites: Secondary | ICD-10-CM

## 2020-06-02 DIAGNOSIS — I10 Essential (primary) hypertension: Secondary | ICD-10-CM | POA: Diagnosis not present

## 2020-06-02 DIAGNOSIS — I129 Hypertensive chronic kidney disease with stage 1 through stage 4 chronic kidney disease, or unspecified chronic kidney disease: Secondary | ICD-10-CM | POA: Diagnosis not present

## 2020-06-02 DIAGNOSIS — M159 Polyosteoarthritis, unspecified: Secondary | ICD-10-CM

## 2020-06-02 MED ORDER — ATORVASTATIN CALCIUM 10 MG PO TABS: 10.0000 mg | ORAL_TABLET | Freq: Every day | ORAL | 3 refills | Status: DC

## 2020-06-02 MED ORDER — LISINOPRIL 20 MG PO TABS: 20.0000 mg | ORAL_TABLET | Freq: Every day | ORAL | 3 refills | Status: DC

## 2020-06-02 NOTE — Assessment & Plan Note (Signed)
Normal result on lab

## 2020-06-02 NOTE — Patient Instructions (Addendum)
Thank you for coming to the office today.  Recommend Tramadol as needed for pain or in advance of pain. It is safest for kidney, instead of ibuprofen, meloxicam.  You can still take meloxicam as needed, but can phase down on this  You have refills of tramadol, let me know if need more, if you are able to take intermittently only that is fine, keep in touch.  1. Chemistry - Improved kidney function, normal including electrolytes, kidney and liver function. - Normal fasting blood sugar   2. Hemoglobin A1c (Diabetes screening) - 5.4, normal not in range of Pre-Diabetes (>5.7 to 6.4)   3. TSH Thyroid Function Tests - Normal.  4. CBC Blood Counts - Normal, no anemia, no other significant abnormality  5. Cholesterol - Controlled on Atorvastatin.  6. CA27.29 Cancer Surveillance is 31 (normal range)  7. Vitamin D 56, normal range  --------------  Holding off on repeat Liver imaging.   Please schedule a Follow-up Appointment to: Return in about 6 months (around 12/03/2020) for 6 month follow-up HTN CKD Arthritis med refill / consider BMET.  If you have any other questions or concerns, please feel free to call the office or send a message through [place]. You may also schedule an earlier appointment if necessary.  Additionally, you may be receiving a survey about your experience at our office within a few days to 1 week by e-mail or mail. We value your feedback.  Nobie Putnam, DO [place]

## 2020-06-02 NOTE — Progress Notes (Signed)
Subjective:    Patient ID: Angela Bullock, female    DOB: Mar 26, 1935, 85 y.o.   MRN: [id number]  Angela Bullock is a 85 y.o. female presenting on 06/02/2020 for Annual Exam and Hypertension   HPI   Here for 6 month follow-up yearly checkup  Follow-up liver cyst ultrasound Recent results showed normal liver tissue on [place], no sign of cirrhosis or other liver disease, had. 3.6 x 2.4 x 3.5 cm hypoechoic lesion in the anterior aspect of the left lobe of the liveron US. They recommended MRI. - She is doing well asymptomatic Last imaging update MRI 11/2019 see below, unremarkable, stable  Chronic Pain Multiple Joints / Osteoarthritis Multiple joints (knees, back) Doing well currently on Tramadol PRN, takes infrequently 1 dose prior to days she will be more active and it has major benefit on reducing pain and allowing her to function and improves her mood as well. Used 30 pills over past 2+ months, has 2 refills available. With CKD she was not taking Ibuprofen or NSAID anymore  CHRONIC HTN/CKD-III Lab showed improved Cr to 0.97, improved Current Meds -Lisinopril20mg  dailycurrently needs refill Reports good compliance, took meds today. Tolerating well, w/o complaints. Interval update, has had Renal US - demonstrated resolved hydronephrosis, prior nephrolithiasis s/p resolved. She has not had any further urinary problem. Walking more regularly Denies CP, dyspnea, HA, edema, dizziness / lightheadedness  HYPERLIPIDEMIA: - Reports no concerns. Last lipid panel 04/2020, controlled on statin - Currently taking Atorvastatin 10mg , tolerating well without side effects or myalgias  Breast Cancer Surveillance See prior history Update w/ CA27.29 lab negative 04/2020  Update Her husband Clair Gulling has passed away in hospital back in 03/2020 She overall has managed well. Daughter primary support system, she works at [employer], lives over in [place]. She is pleased with her current living situation at  the apartment now   Depression screen [facility] 2/9 12/08/2019 11/20/2019 05/21/2019  Decreased Interest 0 0 0  Down, Depressed, Hopeless 0 0 0  PHQ - 2 Score 0 0 0  Altered sleeping - - -  Tired, decreased energy - - -  Change in appetite - - -  Feeling bad or failure about yourself  - - -  Trouble concentrating - - -  Moving slowly or fidgety/restless - - -  Suicidal thoughts - - -  PHQ-9 Score - - -  Difficult doing work/chores - - -    Past Medical History:  Diagnosis Date  . Allergy   . Breast cancer ([place]) 01/30/2012   left, radiation and lumpectomy  . Hypertension   . Personal history of radiation therapy    Past Surgical History:  Procedure Laterality Date  . ABDOMINAL HYSTERECTOMY    . BREAST BIOPSY Left 01/30/2011   negative  . BREAST BIOPSY Left 01/30/2012   positive  . BREAST CYST ASPIRATION Left   . BREAST LUMPECTOMY Left 04/02/2012   positive  . COLONOSCOPY WITH PROPOFOL N/A 07/23/2017   Procedure: COLONOSCOPY WITH PROPOFOL WITH BIOPSIES;  Surgeon: Jonathon Bellows, MD;  Location[facility] ENDOSCOPY;  Service: Gastroenterology;  Laterality: N/A;   Social History   Socioeconomic History  . Marital status: Married    Spouse name: Not on file  . Number of children: Not on file  . Years of education: 49  . Highest education level: High school graduate  Occupational History  . Not on file  Tobacco Use  . Smoking status: Never Smoker  . Smokeless tobacco: Never Used  Vaping  Use  . Vaping Use: Never used  Substance and Sexual Activity  . Alcohol use: No    Alcohol/week: 0.0 standard drinks  . Drug use: No  . Sexual activity: Yes  Other Topics Concern  . Not on file  Social History Narrative  . Not on file   Social Determinants of Health   Financial Resource Strain: Low Risk   . Difficulty of Paying Living Expenses: Not hard at all  Food Insecurity: No Food Insecurity  . Worried About Charity fundraiser in the Last Year: Never true  . Ran Out of Food in the Last  Year: Never true  Transportation Needs: No Transportation Needs  . Lack of Transportation (Medical): No  . Lack of Transportation (Non-Medical): No  Physical Activity: Insufficiently Active  . Days of Exercise per Week: 7 days  . Minutes of Exercise per Session: 10 min  Stress: Stress Concern Present  . Feeling of Stress : To some extent  Social Connections: Not on file  Intimate Partner Violence: Not on file   Family History  Problem Relation Age of Onset  . Breast cancer Paternal Aunt 85  . Osteoporosis Mother   . Heart attack Father    Current Outpatient Medications on File Prior to Visit  Medication Sig  . aspirin EC 325 MG tablet Take 325 mg by mouth daily. As needed   . Biotin 10 MG CAPS Take 10 mg by mouth daily.  . calcium carbonate (OSCAL) 1500 (600 CA) MG TABS tablet Take 1 tablet by mouth every other day.  . cholecalciferol (VITAMIN D) 1000 UNITS tablet Take 1,000 Units by mouth daily.  Marland Kitchen co-enzyme Q-10 50 MG capsule Take 50 mg by mouth daily.  Marland Kitchen glucosamine-chondroitin 500-400 MG tablet Take 1 tablet by mouth 3 (three) times daily.  . meloxicam (MOBIC) 15 MG tablet TAKE 1 TABLET BY MOUTH EVERY DAY AS NEEDED FOR PAIN. PREFERRED TO TAKE UP TO 1 TO 2 WEEKS AT A TIME THEN STOP.  . Multiple Vitamins-Minerals (CENTRUM SILVER PO) Take 1 tablet by mouth daily.  . Omega-3 Krill Oil 300 MG CAPS Take 1 capsule by mouth daily.  Marland Kitchen Resveratrol 250 MG CAPS   . traMADol (ULTRAM) 50 MG tablet Take 1 tablet (50 mg total) by mouth every 8 (eight) hours as needed for moderate pain.  . Calcium Carbonate-Vitamin D 600-200 MG-UNIT TABS Take by mouth. (Patient not taking: No sig reported)   No current facility-administered medications on file prior to visit.    Review of Systems  Constitutional: Negative for activity change, appetite change, chills, diaphoresis, fatigue and fever.  HENT: Negative for congestion and hearing loss.   Eyes: Negative for visual disturbance.  Respiratory:  Negative for cough, chest tightness, shortness of breath and wheezing.   Cardiovascular: Negative for chest pain, palpitations and leg swelling.  Gastrointestinal: Negative for abdominal pain, constipation, diarrhea, nausea and vomiting.  Genitourinary: Negative for dysuria, frequency and hematuria.  Musculoskeletal: Negative for arthralgias and neck pain.  Skin: Negative for rash.  Allergic/Immunologic: Negative for environmental allergies.  Neurological: Negative for dizziness, weakness, light-headedness, numbness and headaches.  Hematological: Negative for adenopathy.  Psychiatric/Behavioral: Negative for behavioral problems, dysphoric mood and sleep disturbance.   Per HPI unless specifically indicated above      Objective:    BP 133/82   Pulse 97   Ht 5\' 4"  (1.626 m)   Wt 179 lb (81.2 kg)   SpO2 100%   BMI 30.73 kg/m   Wt Readings  from Last 3 Encounters:  06/02/20 179 lb (81.2 kg)  12/08/19 179 lb (81.2 kg)  11/20/19 179 lb (81.2 kg)    Physical Exam Vitals and nursing note reviewed.  Constitutional:      General: She is not in acute distress.    Appearance: She is well-developed. She is not diaphoretic.     Comments: Well-appearing, comfortable, cooperative  HENT:     Head: Normocephalic and atraumatic.  Eyes:     General:        Right eye: No discharge.        Left eye: No discharge.     Conjunctiva/sclera: Conjunctivae normal.     Pupils: Pupils are equal, round, and reactive to light.  Neck:     Thyroid: No thyromegaly.     Vascular: No carotid bruit.  Cardiovascular:     Rate and Rhythm: Normal rate and regular rhythm.     Heart sounds: Murmur (stable systolic murmur) heard.    Pulmonary:     Effort: Pulmonary effort is normal. No respiratory distress.     Breath sounds: Normal breath sounds. No wheezing or rales.  Abdominal:     General: Bowel sounds are normal. There is no distension.     Palpations: Abdomen is soft. There is no mass.      Tenderness: There is no abdominal tenderness.  Musculoskeletal:        General: No tenderness. Normal range of motion.     Cervical back: Normal range of motion and neck supple.     Right lower leg: No edema.     Left lower leg: No edema.     Comments: Upper / Lower Extremities: - Normal muscle tone, strength bilateral upper extremities 5/5, lower extremities 5/5  Lymphadenopathy:     Cervical: No cervical adenopathy.  Skin:    General: Skin is warm and dry.     Findings: No erythema or rash.  Neurological:     Mental Status: She is alert and oriented to person, place, and time.     Comments: Distal sensation intact to light touch all extremities  Psychiatric:        Behavior: Behavior normal.     Comments: Well groomed, good eye contact, normal speech and thoughts      CLINICAL DATA:  Liver lesion.  EXAM: MRI ABDOMEN WITHOUT AND WITH CONTRAST  TECHNIQUE: Multiplanar multisequence MR imaging of the abdomen was performed both before and after the administration of intravenous contrast.  CONTRAST:  30mL GADAVIST GADOBUTROL 1 MMOL/ML IV SOLN  COMPARISON:  Ultrasound exam 07/20/2019.  CT scan 06/22/2019.  FINDINGS: Lower chest: Unremarkable.  Hepatobiliary: 3.5 x 4.3 x 2.6 cm well-defined heterogeneous T1 intermediate, T2 intermediate lesion is identified in the inferior left liver, corresponding to the abnormality on previous CT and ultrasound exams. Hree measuring at lesion in a similar fashion on previous CT yields measurements of 3.6 x 4.2 x 2.6 cm, consistent with interval stability over nearly 5 months. No evidence for restricted diffusion. There is some heterogeneous internal enhancement after IV contrast administration, best appreciated on the delayed postcontrast imaging. Postcontrast imaging degraded by motion artifact and misregistration on subtraction imaging.  2.1 x 1.6 cm gallstone identified in the nondistended gallbladder. No intrahepatic or  extrahepatic biliary dilation.  Pancreas: No focal mass lesion. No dilatation of the main duct. No intraparenchymal cyst. No peripancreatic edema.  Spleen:  No splenomegaly. No focal mass lesion.  Adrenals/Urinary Tract: No adrenal nodule or mass. Simple cysts are  noted in both kidneys measuring up to 2.4 cm on the right and 3.7 cm on the left. Postcontrast imaging shows no suspicious enhancing lesion in either kidney.  Stomach/Bowel: Small hiatal hernia. Stomach otherwise unremarkable. Duodenum is normally positioned as is the ligament of Treitz. No small bowel or colonic dilatation within the visualized abdomen.  Vascular/Lymphatic: No abdominal aortic aneurysm. No abdominal lymphadenopathy  Other:  No intraperitoneal free fluid.  Musculoskeletal: Thoracolumbar scoliosis. No focal suspicious marrow enhancement within the visualized bony anatomy.  IMPRESSION: 1. Left hepatic lobe lesion is stable in size since CT scan of 06/22/2019. Imaging characteristics by MRI today are indeterminate but not overtly concerning. This may be atypical hemangioma. Given 5 months of imaging stability, consider repeat CT or MRI in 6 months to ensure that it remains unchanged. 2. Bilateral renal cysts   Electronically Signed   By: Misty Stanley M.D.   On: 12/04/2019 15:37   Results for orders placed or performed in visit on 05/25/20  TSH  Result Value Ref Range   TSH 1.75 0.40 - 4.50 mIU/L  VITAMIN D 25 Hydroxy (Vit-D Deficiency, Fractures)  Result Value Ref Range   Vit D, 25-Hydroxy 56 30 - 100 ng/mL  Cancer antigen 27.29  Result Value Ref Range   CA 27.29 31 <38 U/mL  Lipid panel  Result Value Ref Range   Cholesterol 167 <200 mg/dL   HDL 67 > OR = 50 mg/dL   Triglycerides 110 <150 mg/dL   LDL Cholesterol (Calc) 79 mg/dL (calc)   Total CHOL/HDL Ratio 2.5 <5.0 (calc)   Non-HDL Cholesterol (Calc) 100 <130 mg/dL (calc)  COMPLETE METABOLIC PANEL WITH GFR  Result Value Ref  Range   Glucose, Bld 87 65 - 99 mg/dL   BUN 21 7 - 25 mg/dL   Creat 0.97 (H) 0.60 - 0.88 mg/dL   GFR, Est Non African American 54 (L) > OR = 60 mL/min/1.63m2   GFR, Est African American 63 > OR = 60 mL/min/1.74m2   BUN/Creatinine Ratio 22 6 - 22 (calc)   Sodium 141 135 - 146 mmol/L   Potassium 4.3 3.5 - 5.3 mmol/L   Chloride 107 98 - 110 mmol/L   CO2 25 20 - 32 mmol/L   Calcium 10.4 8.6 - 10.4 mg/dL   Total Protein 6.6 6.1 - 8.1 g/dL   Albumin 4.2 3.6 - 5.1 g/dL   Globulin 2.4 1.9 - 3.7 g/dL (calc)   AG Ratio 1.8 1.0 - 2.5 (calc)   Total Bilirubin 0.8 0.2 - 1.2 mg/dL   Alkaline phosphatase (APISO) 87 37 - 153 U/L   AST 15 10 - 35 U/L   ALT 12 6 - 29 U/L  CBC with Differential/Platelet  Result Value Ref Range   WBC 4.2 3.8 - 10.8 Thousand/uL   RBC 4.47 3.80 - 5.10 Million/uL   Hemoglobin 13.7 11.7 - 15.5 g/dL   HCT 41.6 35.0 - 45.0 %   MCV 93.1 80.0 - 100.0 fL   MCH 30.6 27.0 - 33.0 pg   MCHC 32.9 32.0 - 36.0 g/dL   RDW 11.9 11.0 - 15.0 %   Platelets 188 140 - 400 Thousand/uL   MPV 11.3 7.5 - 12.5 fL   Neutro Abs 2,986 1,500 - 7,800 cells/uL   Lymphs Abs 815 (L) 850 - 3,900 cells/uL   Absolute Monocytes 307 200 - 950 cells/uL   Eosinophils Absolute 71 15 - 500 cells/uL   Basophils Absolute 21 0 - 200 cells/uL   Neutrophils Relative %  71.1 %   Total Lymphocyte 19.4 %   Monocytes Relative 7.3 %   Eosinophils Relative 1.7 %   Basophils Relative 0.5 %  Hemoglobin A1c  Result Value Ref Range   Hgb A1c MFr Bld 5.4 <5.7 % of total Hgb   Mean Plasma Glucose 108 mg/dL   eAG (mmol/L) 6.0 mmol/L      Assessment & Plan:   Problem List Items Addressed This Visit    Vitamin D deficiency    Normal result on lab      Osteoarthritis, multiple sites    Stable chronic OA/DJD multiple joints Without recent flare No recent imaging, would benefit from x-ray for characterizing arthritis  Continue OTC supplements glucosamine/chondroitin and Resveratrol   LIMIT NSAID orally  still, now on PRN meloxicam, may taper down more Significantly improved on Tramadol 50mg  daily PRN, 30 pills over 2+ months, taking infrequent but helps when take before inc activity. Checked PDMP. Advise can expand on Tramadol use in future if helps her function and does not interfere with kidney function, now has improved on less NSAID.  Future refer to Ortho when ready Or we can do steroid injections for knees here, counseling today      Liver cyst    Review of prior imaging results initial CT incidental finding 05/2019, had ultrasound then MRI 11/2019 At this point, appears to not have any high risk or concerning features, radiology suggests that it can be considered to repeat imaging, patient is not interested in repeat imaging, and I would be comfortable in agreeing that she can monitor for any concerns or symptoms and we can revisit this topic in 1 year.      Hyperlipidemia    Controlled cholesterol on statin and lifestyle  Plan: 1. Continue current meds - Atorvastatin 10mg  daily 2. Continue ASA 81mg  for primary ASCVD risk reduction 3. Encourage improved lifestyle - low carb/cholesterol, reduce portion size, continue improving regular exercise 4. Follow-up yearly lipids      Relevant Medications   atorvastatin (LIPITOR) 10 MG tablet   lisinopril (ZESTRIL) 20 MG tablet   History of breast cancer    Stable, without recurrence See HPI / overview for background on breast cancer Left sided, s/p partial mastectomy after biopsy excision, radiation, 1 yr hormonal therapy Arimidex  Last lab CA27.29 negative 04/2020 - check yearly per Onc recommendations  She would like to defer or decline mammogram and Oncology follow-up      CKD (chronic kidney disease) stage 3, GFR 30-59 ml/min (HCC)    Stable, chronic CKD-III - improved on last lab - Secondary to age, HTN, NSAIDs likely  Plan: 1. May phase down more off NSAID Meloxicam since Tramadol very successful - for now Continue  intermittent Meloxicam 1-2 weeks on / off, avoid daily use 2. Improve hydration 3. Control BP      Benign hypertension with CKD (chronic kidney disease) stage III - Primary    Controlled HTN - Home BP readings normal  Complication with CKD-III - Cr improved today OFF BB Metoprolol   Plan:  1. Continue Lisinopril 20mg  daily refill 2. Encourage improved lifestyle - low sodium diet, regular exercise 3. Continue monitor BP outside office, bring readings to next visit, if persistently >140/90 or new symptoms notify office sooner 4. F/u 6 months  Reconsider ECHOcardiogram given heart murmur and ultimately we agree to defer for now, she is asymptomatic.      Relevant Medications   atorvastatin (LIPITOR) 10 MG tablet   lisinopril (  ZESTRIL) 20 MG tablet    Other Visit Diagnoses    Essential hypertension       Relevant Medications   atorvastatin (LIPITOR) 10 MG tablet   lisinopril (ZESTRIL) 20 MG tablet      Meds ordered this encounter  Medications  . atorvastatin (LIPITOR) 10 MG tablet    Sig: Take 1 tablet (10 mg total) by mouth at bedtime.    Dispense:  90 tablet    Refill:  3    Add refills  . lisinopril (ZESTRIL) 20 MG tablet    Sig: Take 1 tablet (20 mg total) by mouth daily.    Dispense:  90 tablet    Refill:  3    Add refills on file      Follow up plan: Return in about 6 months (around 12/03/2020) for 6 month follow-up HTN CKD Arthritis med refill / consider BMET.  Nobie Putnam, [facility] 06/02/2020, 11:23 AM

## 2020-06-02 NOTE — Assessment & Plan Note (Signed)
Stable chronic OA/DJD multiple joints Without recent flare No recent imaging, would benefit from x-ray for characterizing arthritis  Continue OTC supplements glucosamine/chondroitin and Resveratrol   LIMIT NSAID orally still, now on PRN meloxicam, may taper down more Significantly improved on Tramadol 50mg  daily PRN, 30 pills over 2+ months, taking infrequent but helps when take before inc activity. Checked PDMP. Advise can expand on Tramadol use in future if helps her function and does not interfere with kidney function, now has improved on less NSAID.  Future refer to Ortho when ready Or we can do steroid injections for knees here, counseling today

## 2020-06-02 NOTE — Assessment & Plan Note (Signed)
Stable, chronic CKD-III - improved on last lab - Secondary to age, HTN, NSAIDs likely  Plan: 1. May phase down more off NSAID Meloxicam since Tramadol very successful - for now Continue intermittent Meloxicam 1-2 weeks on / off, avoid daily use 2. Improve hydration 3. Control BP

## 2020-06-02 NOTE — Assessment & Plan Note (Signed)
Controlled cholesterol on statin and lifestyle  Plan: 1. Continue current meds - Atorvastatin '10mg'$  daily 2. Continue ASA '81mg'$  for primary ASCVD risk reduction 3. Encourage improved lifestyle - low carb/cholesterol, reduce portion size, continue improving regular exercise 4. Follow-up yearly lipids

## 2020-06-02 NOTE — Assessment & Plan Note (Signed)
Stable, without recurrence See HPI / overview for background on breast cancer Left sided, s/p partial mastectomy after biopsy excision, radiation, 1 yr hormonal therapy Arimidex  Last lab CA27.29 negative 04/2020 - check yearly per Onc recommendations  She would like to defer or decline mammogram and Oncology follow-up

## 2020-06-02 NOTE — Assessment & Plan Note (Signed)
Controlled HTN - Home BP readings normal  Complication with CKD-III - Cr improved today OFF BB Metoprolol   Plan:  1. Continue Lisinopril 20mg  daily refill 2. Encourage improved lifestyle - low sodium diet, regular exercise 3. Continue monitor BP outside office, bring readings to next visit, if persistently >140/90 or new symptoms notify office sooner 4. F/u 6 months  Reconsider ECHOcardiogram given heart murmur and ultimately we agree to defer for now, she is asymptomatic.

## 2020-06-02 NOTE — Assessment & Plan Note (Signed)
Review of prior imaging results initial CT incidental finding 05/2019, had ultrasound then MRI 11/2019 At this point, appears to not have any high risk or concerning features, radiology suggests that it can be considered to repeat imaging, patient is not interested in repeat imaging, and I would be comfortable in agreeing that she can monitor for any concerns or symptoms and we can revisit this topic in 1 year.

## 2020-06-19 ENCOUNTER — Other Ambulatory Visit: Payer: Self-pay | Admitting: Family Medicine

## 2020-06-19 DIAGNOSIS — I10 Essential (primary) hypertension: Secondary | ICD-10-CM

## 2020-06-19 NOTE — Telephone Encounter (Signed)
Requested Prescriptions  Pending Prescriptions Disp Refills  . lisinopril (ZESTRIL) 20 MG tablet [Pharmacy Med Name: LISINOPRIL 20MG  TABLETS] 90 tablet 1    Sig: TAKE 1 TABLET(20 MG) BY MOUTH DAILY     Cardiovascular:  ACE Inhibitors Failed - 06/19/2020 10:08 AM      Failed - Cr in normal range and within 180 days    Creat  Date Value Ref Range Status  05/25/2020 0.97 (H) 0.60 - 0.88 mg/dL Final    Comment:    For patients >83 years of age, the reference limit for Creatinine is approximately 13% higher for people identified as African-American. .          Passed - K in normal range and within 180 days    Potassium  Date Value Ref Range Status  05/25/2020 4.3 3.5 - 5.3 mmol/L Final  10/13/2013 4.3 3.5 - 5.1 mmol/L Final         Passed - Patient is not pregnant      Passed - Last BP in normal range    BP Readings from Last 1 Encounters:  06/02/20 133/82         Passed - Valid encounter within last 6 months    Recent Outpatient Visits          2 weeks ago Benign hypertension with CKD (chronic kidney disease) stage III   [facility] Olin Hauser, DO   7 months ago Liver cyst   [facility] [place], Devonne Doughty, DO   10 months ago Benign hypertension with CKD (chronic kidney disease) stage III   [place], DO   11 months ago Benign hypertension with CKD (chronic kidney disease) stage III   [place], DO   1 year ago Benign hypertension with CKD (chronic kidney disease) stage III   [facility] [place], Devonne Doughty, DO      Future Appointments            In 5 months [place], Devonne Doughty, DO [facility], [place]   In 5 months  [facility], [facility]

## 2020-06-23 DIAGNOSIS — C441121 Basal cell carcinoma of skin of right upper eyelid, including canthus: Secondary | ICD-10-CM | POA: Diagnosis not present

## 2020-08-18 ENCOUNTER — Ambulatory Visit (INDEPENDENT_AMBULATORY_CARE_PROVIDER_SITE_OTHER): Payer: Medicare Other | Admitting: Podiatry

## 2020-08-18 ENCOUNTER — Encounter: Payer: Self-pay | Admitting: Podiatry

## 2020-08-18 ENCOUNTER — Other Ambulatory Visit: Payer: Self-pay

## 2020-08-18 DIAGNOSIS — N1831 Chronic kidney disease, stage 3a: Secondary | ICD-10-CM | POA: Diagnosis not present

## 2020-08-18 DIAGNOSIS — M79676 Pain in unspecified toe(s): Secondary | ICD-10-CM

## 2020-08-18 DIAGNOSIS — B351 Tinea unguium: Secondary | ICD-10-CM

## 2020-08-18 NOTE — Progress Notes (Signed)
This patient returns to my office for at risk foot care.  This patient requires this care by a professional since this patient will be at risk due to having kidney disease.  This patient is unable to cut nails herself since the patient cannot reach her nails.These nails are painful walking and wearing shoes.  This patient presents for at risk foot care today.  General Appearance  Alert, conversant and in no acute stress.  Vascular  Dorsalis pedis and posterior tibial  pulses are palpable  bilaterally.  Capillary return is within normal limits  bilaterally. Temperature is within normal limits  bilaterally.  Neurologic  Senn-Weinstein monofilament wire test within normal limits  bilaterally. Muscle power within normal limits bilaterally.  Nails Thick disfigured discolored nails with subungual debris  from hallux to fifth toes bilaterally. No evidence of bacterial infection or drainage bilaterally.  Orthopedic  No limitations of motion  feet .  No crepitus or effusions noted.  No bony pathology or digital deformities noted. HAV  B/L.  Hammer toes second  B/L.  Skin  normotropic skin with no porokeratosis noted bilaterally.  No signs of infections or ulcers noted.     Onychomycosis  Pain in right toes  Pain in left toes  Consent was obtained for treatment procedures.   Mechanical debridement of nails 1-5  bilaterally performed with a nail nipper.  Filed with dremel without incident.    Return office visit   4 months                   Told patient to return for periodic foot care and evaluation due to potential at risk complications.   Gardiner Barefoot DPM

## 2020-08-27 ENCOUNTER — Other Ambulatory Visit: Payer: Self-pay | Admitting: Family Medicine

## 2020-08-27 DIAGNOSIS — G8929 Other chronic pain: Secondary | ICD-10-CM

## 2020-08-27 DIAGNOSIS — M25562 Pain in left knee: Secondary | ICD-10-CM

## 2020-08-27 DIAGNOSIS — M545 Low back pain, unspecified: Secondary | ICD-10-CM

## 2020-08-27 DIAGNOSIS — M8949 Other hypertrophic osteoarthropathy, multiple sites: Secondary | ICD-10-CM

## 2020-08-27 DIAGNOSIS — M159 Polyosteoarthritis, unspecified: Secondary | ICD-10-CM

## 2020-08-27 NOTE — Telephone Encounter (Signed)
Requested medication (s) are due for refill today: yes  Requested medication (s) are on the active medication list: yes  Last refill:  04/11/20 #30 2 RF  Future visit scheduled: yes  Notes to clinic:  med not delegated to NT to RF   Requested Prescriptions  Pending Prescriptions Disp Refills   traMADol (ULTRAM) 50 MG tablet [Pharmacy Med Name: TRAMADOL '50MG'$  TABLETS] 30 tablet     Sig: TAKE 1 TABLET(50 MG) BY MOUTH EVERY 8 HOURS AS NEEDED FOR MODERATE PAIN      Not Delegated - Analgesics:  Opioid Agonists Failed - 08/27/2020 12:19 PM      Failed - This refill cannot be delegated      Failed - Urine Drug Screen completed in last 360 days      Passed - Valid encounter within last 6 months    Recent Outpatient Visits           2 months ago Benign hypertension with CKD (chronic kidney disease) stage III   [facility] Olin Hauser, DO   9 months ago Liver cyst   [facility] Olin Hauser, DO   1 year ago Benign hypertension with CKD (chronic kidney disease) stage III   [facility] [place], Devonne Doughty, DO   1 year ago Benign hypertension with CKD (chronic kidney disease) stage III   [place], DO   1 year ago Benign hypertension with CKD (chronic kidney disease) stage III   [facility] [place], Devonne Doughty, DO       Future Appointments             In 3 months [place], Devonne Doughty, DO [facility], [place]   In 3 months  [facility], [facility]

## 2020-09-23 IMAGING — US US ABDOMEN LIMITED
1 series · 13 of 25 positions shown · non-contrast
Comparison: No prior abdominal ultrasound. CT the abdomen and
pelvis 06/22/2019.

CLINICAL DATA: 83-year-old female with history of liver lesion
noted on prior CT examination. Follow-up study.

EXAM:
ULTRASOUND ABDOMEN LIMITED RIGHT UPPER QUADRANT

[Series 1: us abdomen limited · 0.23mm/px · 13 of 52 slices shown]
[im 1/52]
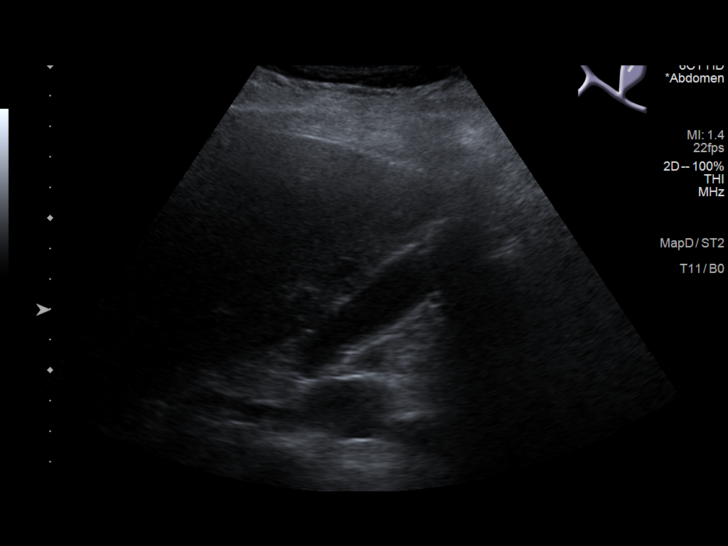
[im 5/52]
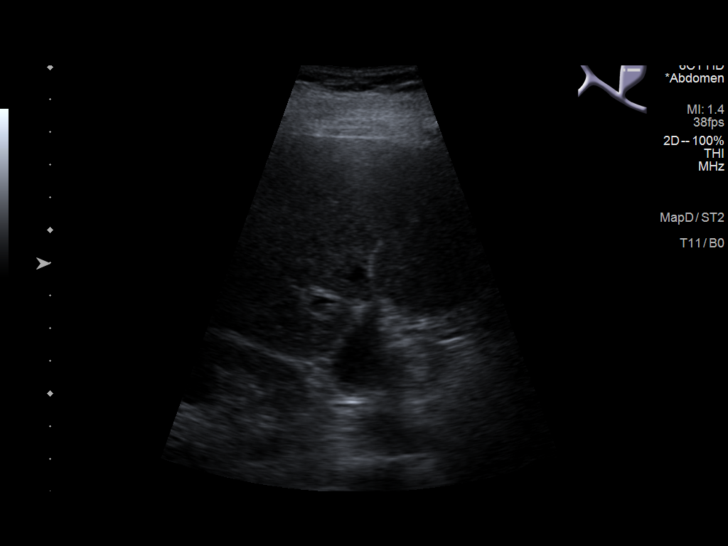
[im 9/52]
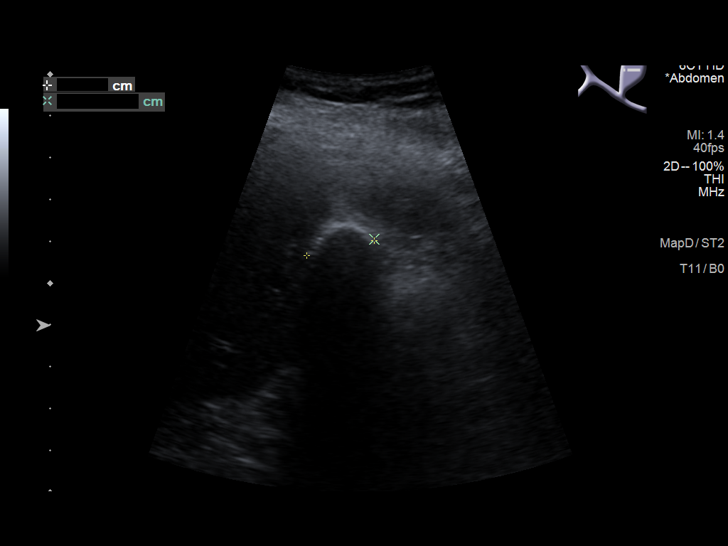
[im 13/52]
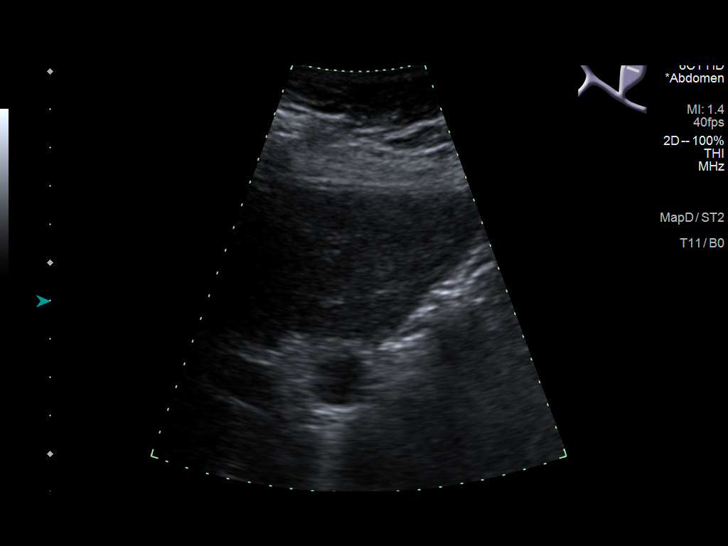
[im 18/52]
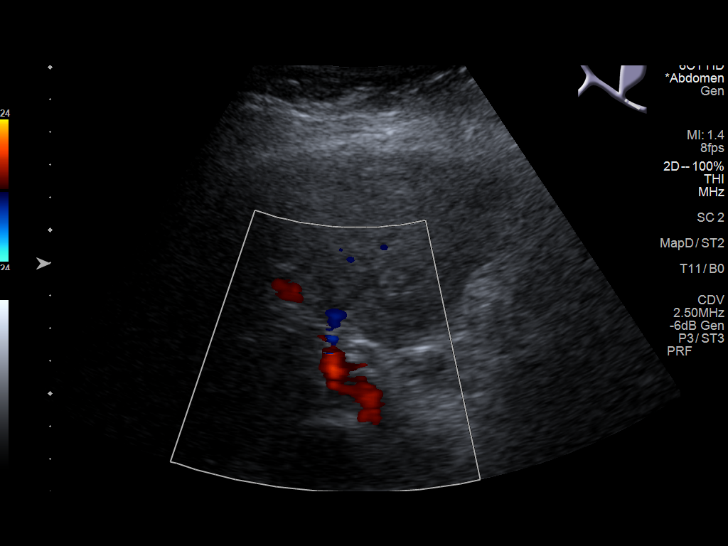
[im 22/52]
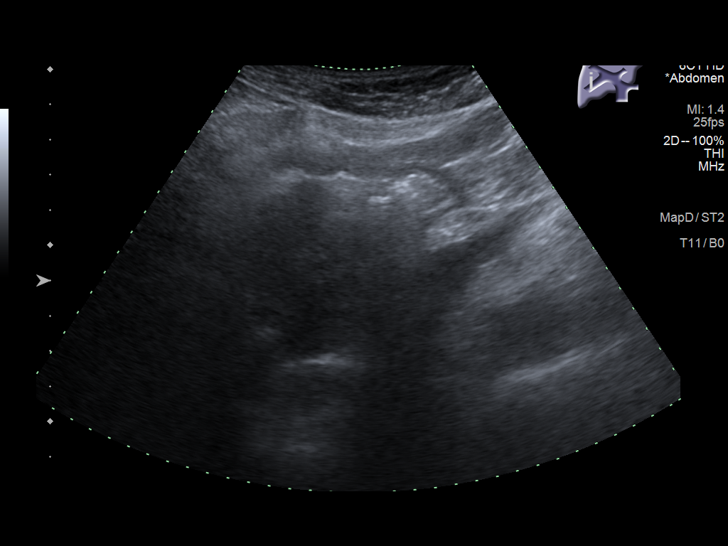
[im 26/52]
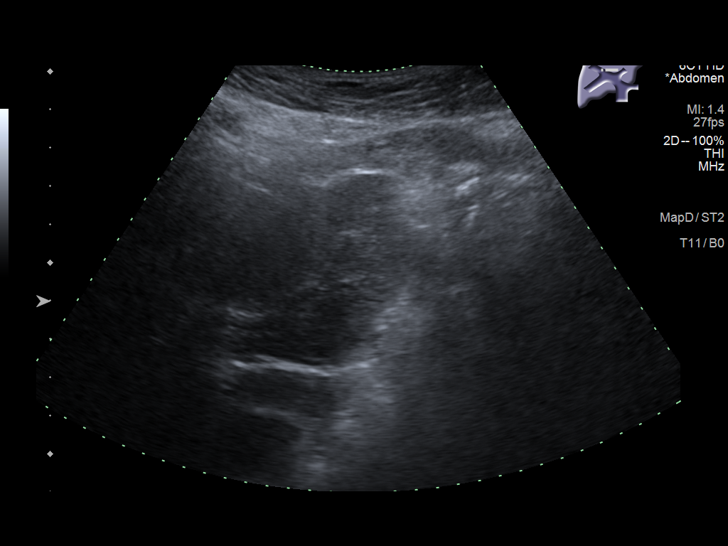
[im 30/52]
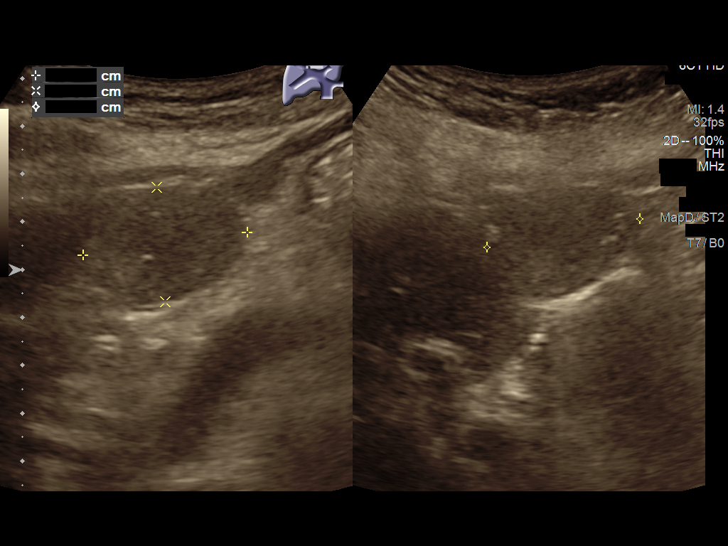
[im 35/52]
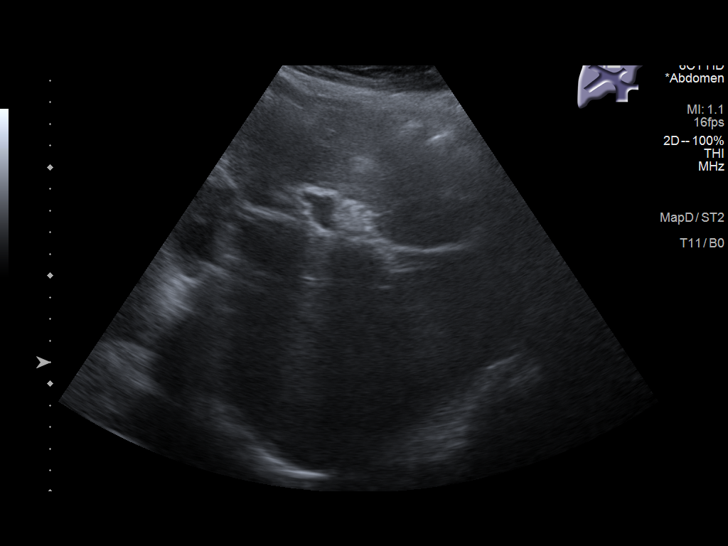
[im 39/52]
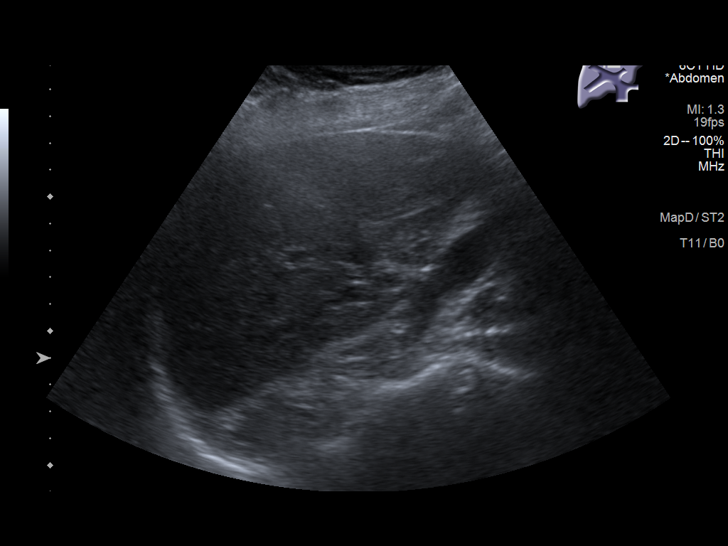
[im 43/52]
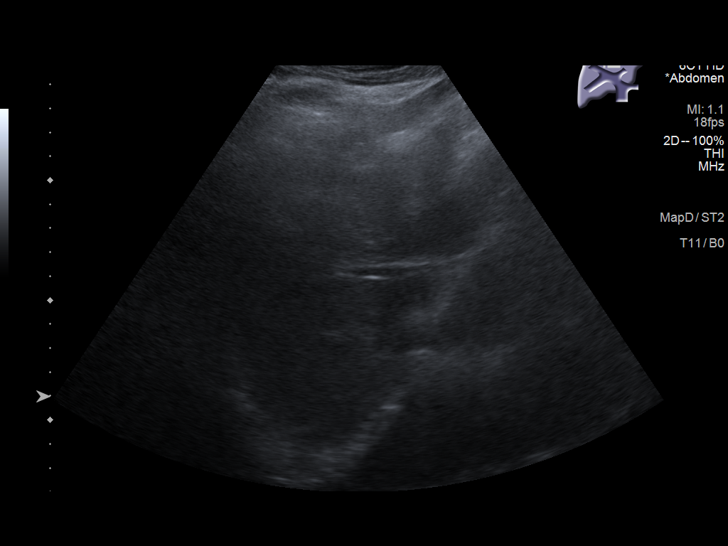
[im 47/52]
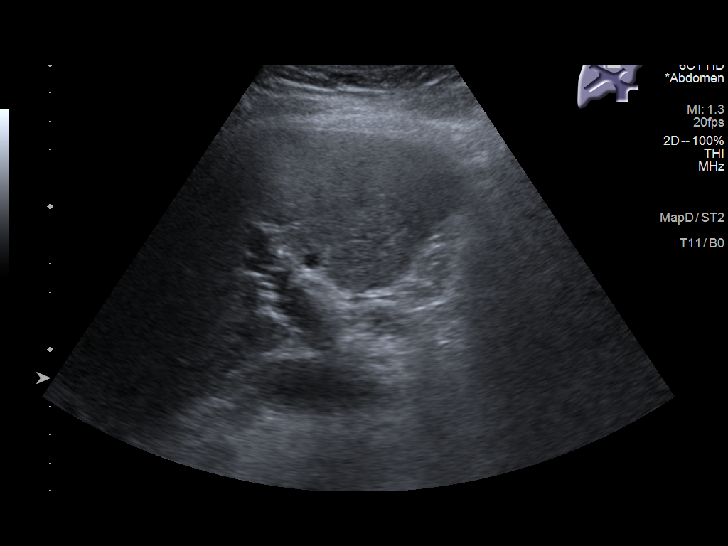
[im 52/52]
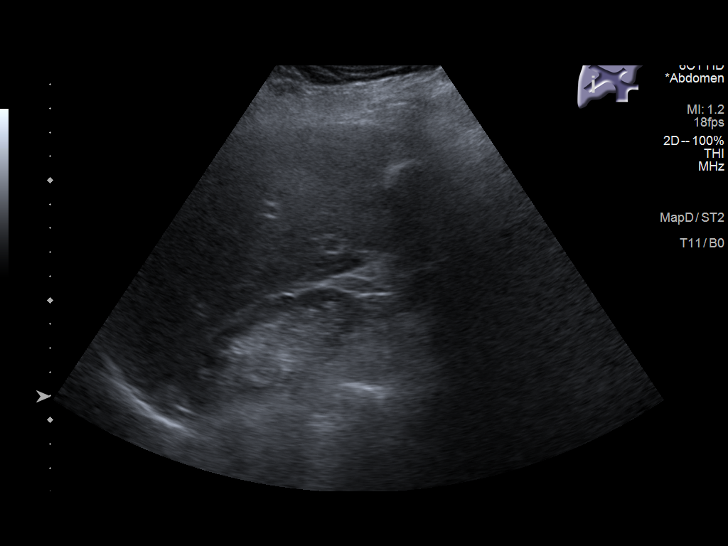

[13 of 25 positions shown; findings below may reference images not displayed]

FINDINGS: Gallbladder:

Large echogenic focus with posterior acoustic shadowing in the
gallbladder fundus measuring 2.7 cm in diameter. Gallbladder is not
distended. Gallbladder wall thickness is normal at 2.4 mm. No
pericholecystic fluid. Per report from the sonographer, there was no
sonographic Murphy's sign on examination.

Common bile duct:

Diameter: 3.2 mm

Liver:

In the anterior aspect of the left lobe of the liver there is a
x 2.4 x 3.5 cm hypoechoic lesion with some increased through
transmission. Within normal limits in parenchymal echogenicity.
Portal vein is patent on color Doppler imaging with normal direction
of blood flow towards the liver.

Other: None.
IMPRESSION: 1. 3.6 x 2.4 x 3.5 cm hypoechoic lesion in the anterior aspect of
the left lobe of the liver, incompletely characterized on today's
ultrasound examination. This may simply represent a proteinaceous
cyst, however, definitive characterization of this lesion with
abdominal MRI with and without IV gadolinium is once again
recommended.
2. Cholelithiasis without evidence of acute cholecystitis at this
time.

## 2020-11-24 ENCOUNTER — Ambulatory Visit: Payer: Medicare Other

## 2020-11-25 ENCOUNTER — Telehealth: Payer: Self-pay | Admitting: Family Medicine

## 2020-11-25 NOTE — Telephone Encounter (Signed)
N/A unable to LVM. If patient calls back please reschedule her AWV scheduled for 12/13/20 to a Friday [facility] open appointment.  Due to change in [facility] schedule appointments will be on Fridays. Any questions, please call me at [phone].

## 2020-12-02 ENCOUNTER — Ambulatory Visit (INDEPENDENT_AMBULATORY_CARE_PROVIDER_SITE_OTHER): Payer: Medicare Other | Admitting: Family Medicine

## 2020-12-02 ENCOUNTER — Encounter: Payer: Self-pay | Admitting: Family Medicine

## 2020-12-02 ENCOUNTER — Other Ambulatory Visit: Payer: Self-pay | Admitting: Family Medicine

## 2020-12-02 ENCOUNTER — Other Ambulatory Visit: Payer: Self-pay

## 2020-12-02 VITALS — BP 158/80 | HR 91 | Ht 64.0 in | Wt 183.2 lb

## 2020-12-02 DIAGNOSIS — E782 Mixed hyperlipidemia: Secondary | ICD-10-CM

## 2020-12-02 DIAGNOSIS — N183 Chronic kidney disease, stage 3 unspecified: Secondary | ICD-10-CM

## 2020-12-02 DIAGNOSIS — I129 Hypertensive chronic kidney disease with stage 1 through stage 4 chronic kidney disease, or unspecified chronic kidney disease: Secondary | ICD-10-CM | POA: Diagnosis not present

## 2020-12-02 DIAGNOSIS — Z853 Personal history of malignant neoplasm of breast: Secondary | ICD-10-CM

## 2020-12-02 DIAGNOSIS — M159 Polyosteoarthritis, unspecified: Secondary | ICD-10-CM | POA: Diagnosis not present

## 2020-12-02 DIAGNOSIS — R7309 Other abnormal glucose: Secondary | ICD-10-CM

## 2020-12-02 DIAGNOSIS — N1831 Chronic kidney disease, stage 3a: Secondary | ICD-10-CM

## 2020-12-02 DIAGNOSIS — I700 Atherosclerosis of aorta: Secondary | ICD-10-CM

## 2020-12-02 DIAGNOSIS — E785 Hyperlipidemia, unspecified: Secondary | ICD-10-CM

## 2020-12-02 DIAGNOSIS — E559 Vitamin D deficiency, unspecified: Secondary | ICD-10-CM

## 2020-12-02 DIAGNOSIS — M85851 Other specified disorders of bone density and structure, right thigh: Secondary | ICD-10-CM | POA: Diagnosis not present

## 2020-12-02 MED ORDER — LISINOPRIL 20 MG PO TABS: 20.0000 mg | ORAL_TABLET | Freq: Every day | ORAL | 3 refills | Status: DC

## 2020-12-02 NOTE — Patient Instructions (Addendum)
Thank you for coming to the office today.  Shingrix vaccine 01/2021 medicare will now cover, can get at pharmacy hopefully free of charge.  COVID Booster updated Omicron bivalent version.  Keep track of BP readings, maybe did not take BP med this morning. Keep track and let me know if elevated.  If sleep bothers more we can consider a sleep study.  DUE for FASTING BLOOD WORK (no food or drink after midnight before the lab appointment, only water or coffee without cream/sugar on the morning of)  SCHEDULE "Lab Only" visit in the morning at the clinic for lab draw in 6 MONTHS   - Make sure Lab Only appointment is at about 1 week before your next appointment, so that results will be available  For Lab Results, once available within 2-3 days of blood draw, you can can log in to MyChart online to view your results and a brief explanation. Also, we can discuss results at next follow-up visit.    Please schedule a Follow-up Appointment to: Return in about 6 months (around 06/01/2021) for 6 month fasting lab 1 week then later Annual Physical.  If you have any other questions or concerns, please feel free to call the office or send a message through [place]. You may also schedule an earlier appointment if necessary.  Additionally, you may be receiving a survey about your experience at our office within a few days to 1 week by e-mail or mail. We value your feedback.  Nobie Putnam, DO [place]

## 2020-12-02 NOTE — Progress Notes (Signed)
Subjective:    Patient ID: Angela Bullock, female    DOB: [birth year], 85 y.o.   MRN: [id number]  Angela Bullock is a 85 y.o. female presenting on 12/02/2020 for Hypertension   HPI   Follow-up liver cyst ultrasound Recent results showed normal liver tissue on [place], no sign of cirrhosis or other liver disease, had . 3.6 x 2.4 x 3.5 cm hypoechoic lesion in the anterior aspect of the left lobe of the liver on [place]. They recommended MRI. - She is doing well asymptomatic Last imaging update MRI 11/2019 see below, unremarkable, stable   Chronic Pain Multiple Joints / Osteoarthritis Multiple joints (knees, back) Doing well currently on Tramadol PRN, takes infrequently 1 dose prior to days she will be more active and it has major benefit on reducing pain and allowing her to function and improves her mood as well. With CKD she was not taking Ibuprofen or NSAID anymore Now taking Tramadol 50mg  once daily, will need re order for pill count.   CHRONIC HTN / CKD-III Lab showed improved Cr to 0.97, improved Current Meds - Lisinopril 20mg  daily currently needs refill Reports good compliance, took meds today. Tolerating well, w/o complaints. Interval update, has had Renal US - demonstrated resolved hydronephrosis, prior nephrolithiasis s/p resolved. She has not had any further urinary problem. Walking more regularly Denies CP, dyspnea, HA, edema, dizziness / lightheadedness   HYPERLIPIDEMIA: - Reports no concerns. Last lipid panel 04/2020, controlled on statin - Currently taking Atorvastatin 10mg , tolerating well without side effects or myalgias   Breast Cancer Surveillance See prior history Update w/ CA27.29 lab negative 04/2020   Aortic Atherosclerosis See HLD above Seen on imaging CT previously documented. On Statin  Health Maintenance: UTD Flu Shot 09/2020  COVID series 2021 and booster 10/2019, consider new covid update.  She had prior Zostavax.  Osteopenia R Femur/Hip She has had prior  DEXA 2018, had mild osteopenia R femoral neck., otherwise normal. She will consider repeat DEXA. Fam with osteoporosis. Has had a fall but no injury.  Depression screen [facility] 2/9 12/08/2019 11/20/2019 05/21/2019  Decreased Interest 0 0 0  Down, Depressed, Hopeless 0 0 0  PHQ - 2 Score 0 0 0  Altered sleeping - - -  Tired, decreased energy - - -  Change in appetite - - -  Feeling bad or failure about yourself  - - -  Trouble concentrating - - -  Moving slowly or fidgety/restless - - -  Suicidal thoughts - - -  PHQ-9 Score - - -  Difficult doing work/chores - - -    Social History   Tobacco Use   Smoking status: Never   Smokeless tobacco: Never  Vaping Use   Vaping Use: Never used  Substance Use Topics   Alcohol use: No    Alcohol/week: 0.0 standard drinks   Drug use: No    Review of Systems Per HPI unless specifically indicated above     Objective:    BP (!) 158/80 (BP Location: Left Arm, Cuff Size: Normal)   Pulse 91   Ht 5\' 4"  (1.626 m)   Wt 183 lb 3.2 oz (83.1 kg)   SpO2 100%   BMI 31.45 kg/m   Wt Readings from Last 3 Encounters:  12/02/20 183 lb 3.2 oz (83.1 kg)  06/02/20 179 lb (81.2 kg)  12/08/19 179 lb (81.2 kg)    Physical Exam Vitals and nursing note reviewed.  Constitutional:      General: She is  not in acute distress.    Appearance: She is well-developed. She is not diaphoretic.     Comments: Well-appearing, comfortable, cooperative  HENT:     Head: Normocephalic and atraumatic.  Eyes:     General:        Right eye: No discharge.        Left eye: No discharge.     Conjunctiva/sclera: Conjunctivae normal.  Neck:     Thyroid: No thyromegaly.  Cardiovascular:     Rate and Rhythm: Normal rate and regular rhythm.     Heart sounds: Normal heart sounds. No murmur heard. Pulmonary:     Effort: Pulmonary effort is normal. No respiratory distress.     Breath sounds: Normal breath sounds. No wheezing or rales.  Musculoskeletal:        General: Normal  range of motion.     Cervical back: Normal range of motion and neck supple.  Lymphadenopathy:     Cervical: No cervical adenopathy.  Skin:    General: Skin is warm and dry.     Findings: No erythema or rash.  Neurological:     Mental Status: She is alert and oriented to person, place, and time.  Psychiatric:        Behavior: Behavior normal.     Comments: Well groomed, good eye contact, normal speech and thoughts     CLINICAL DATA:  Right lower quadrant abdominal pain   EXAM: CT ABDOMEN AND PELVIS WITHOUT CONTRAST   TECHNIQUE: Multidetector CT imaging of the abdomen and pelvis was performed following the standard protocol without IV contrast.   COMPARISON:  Radiograph 05/09/2017   FINDINGS: Lower chest: Bandlike areas of opacity in the lung bases likely reflect atelectasis and/or scarring. Small punctate calcified granuloma in the right middle lobe (2/2). There is dense calcification of the mitral annulus and likely calcifications on the aortic leaflets. Normal cardiac size. No pericardial effusion.   Hepatobiliary: There is a 4.4 x 2.4 cm ovoid lesion arising from the anterior left lobe liver, segment 3 (2/21). This is incompletely characterized on this exam. No other concerning liver lesions. Smooth liver surface contour. Normal attenuation of the liver. There is a calcified gallstone within the largely collapsed gallbladder. No pericholecystic fluid or inflammation. No biliary ductal dilatation or calcified intraductal gallstones.   Pancreas: Partial fatty replacement. No pancreatic ductal dilatation or surrounding inflammatory changes.   Spleen: Normal in size without focal abnormality.   Adrenals/Urinary Tract: Normal adrenal glands. Asymmetric right perinephric and periureteral stranding with moderate hydroureteronephrosis to the level of a 3 mm calculus in the distal right ureter just proximal to the ureterovesicular junction (2/66). Small fluid attenuation  cysts are present in both kidneys, largest in the left measuring up to 4 cm (2/28). No concerning renal lesion. Urinary bladder is largely decompressed at the time of exam and therefore poorly evaluated by CT imaging. No other gross bladder abnormality is seen.   Stomach/Bowel: Large hiatal hernia without evidence of obstruction given passage of contrast beyond this level. Distal stomach duodenum and small bowel are unremarkable. Contrast traverses to the level of the cecum. Question postsurgical changes along the cecum which may reflect prior appendectomy, correlate with surgical history. No colonic dilatation or wall thickening. Scattered colonic diverticula without focal pericolonic inflammation to suggest diverticulitis.   Vascular/Lymphatic: Atherosclerotic calcifications throughout the abdominal aorta and branch vessels. No aneurysm or ectasia. No enlarged abdominopelvic lymph nodes.   Reproductive: Uterus is surgically absent. No concerning adnexal lesions.   Other:  Small fat containing umbilical hernia. No abdominopelvic free fluid or free gas. No bowel containing hernias.   Musculoskeletal: No acute osseous abnormality or suspicious osseous lesion. Multilevel degenerative changes are present in the imaged portions of the spine. Levocurvature of the thoracolumbar junction, dextrocurvature of the lumbar spine. Mild lateral listhesis L4 on L5 with associated discogenic and facet degenerative changes at this level. No spondylolysis is evident. Additional degenerative changes in the hips and pelvis.   IMPRESSION: 1. Asymmetric right perinephric and periureteral stranding with moderate hydroureteronephrosis to the level of a 3 mm calculus in the distal right ureter just proximal to the ureterovesicular junction. 2. There is a 4.4 x 2.4 cm ovoid lesion arising from the anterior left lobe liver, segment 3 (2/21). This is incompletely characterized on this exam. Recommend  further characterization with multiphase liver MRI. 3. Cholelithiasis without evidence of acute cholecystitis. 4. Large hiatal hernia without evidence of obstruction. 5. Colonic diverticulosis without evidence of diverticulitis. 6. Aortic Atherosclerosis (ICD10-I70.0).     Electronically Signed   By: Lovena Le M.D.   On: 06/22/2019 02:22   Results for orders placed or performed in visit on 05/25/20  TSH  Result Value Ref Range   TSH 1.75 0.40 - 4.50 mIU/L  VITAMIN D 25 Hydroxy (Vit-D Deficiency, Fractures)  Result Value Ref Range   Vit D, 25-Hydroxy 56 30 - 100 ng/mL  Cancer antigen 27.29  Result Value Ref Range   CA 27.29 31 <38 U/mL  Lipid panel  Result Value Ref Range   Cholesterol 167 <200 mg/dL   HDL 67 > OR = 50 mg/dL   Triglycerides 110 <150 mg/dL   LDL Cholesterol (Calc) 79 mg/dL (calc)   Total CHOL/HDL Ratio 2.5 <5.0 (calc)   Non-HDL Cholesterol (Calc) 100 <130 mg/dL (calc)  COMPLETE METABOLIC PANEL WITH GFR  Result Value Ref Range   Glucose, Bld 87 65 - 99 mg/dL   BUN 21 7 - 25 mg/dL   Creat 0.97 (H) 0.60 - 0.88 mg/dL   GFR, Est Non African American 54 (L) > OR = 60 mL/min/1.48m2   GFR, Est African American 63 > OR = 60 mL/min/1.43m2   BUN/Creatinine Ratio 22 6 - 22 (calc)   Sodium 141 135 - 146 mmol/L   Potassium 4.3 3.5 - 5.3 mmol/L   Chloride 107 98 - 110 mmol/L   CO2 25 20 - 32 mmol/L   Calcium 10.4 8.6 - 10.4 mg/dL   Total Protein 6.6 6.1 - 8.1 g/dL   Albumin 4.2 3.6 - 5.1 g/dL   Globulin 2.4 1.9 - 3.7 g/dL (calc)   AG Ratio 1.8 1.0 - 2.5 (calc)   Total Bilirubin 0.8 0.2 - 1.2 mg/dL   Alkaline phosphatase (APISO) 87 37 - 153 U/L   AST 15 10 - 35 U/L   ALT 12 6 - 29 U/L  CBC with Differential/Platelet  Result Value Ref Range   WBC 4.2 3.8 - 10.8 Thousand/uL   RBC 4.47 3.80 - 5.10 Million/uL   Hemoglobin 13.7 11.7 - 15.5 g/dL   HCT 41.6 35.0 - 45.0 %   MCV 93.1 80.0 - 100.0 fL   MCH 30.6 27.0 - 33.0 pg   MCHC 32.9 32.0 - 36.0 g/dL   RDW  11.9 11.0 - 15.0 %   Platelets 188 140 - 400 Thousand/uL   MPV 11.3 7.5 - 12.5 fL   Neutro Abs 2,986 1,500 - 7,800 cells/uL   Lymphs Abs 815 (L) 850 - 3,900 cells/uL  Absolute Monocytes 307 200 - 950 cells/uL   Eosinophils Absolute 71 15 - 500 cells/uL   Basophils Absolute 21 0 - 200 cells/uL   Neutrophils Relative % 71.1 %   Total Lymphocyte 19.4 %   Monocytes Relative 7.3 %   Eosinophils Relative 1.7 %   Basophils Relative 0.5 %  Hemoglobin A1c  Result Value Ref Range   Hgb A1c MFr Bld 5.4 <5.7 % of total Hgb   Mean Plasma Glucose 108 mg/dL   eAG (mmol/L) 6.0 mmol/L      Assessment & Plan:   Problem List Items Addressed This Visit     Osteopenia of neck of right femur   Osteoarthritis, multiple sites   Hyperlipidemia   Relevant Medications   lisinopril (ZESTRIL) 20 MG tablet   History of breast cancer   CKD (chronic kidney disease) stage 3, GFR 30-59 ml/min (HCC)   Benign hypertension with CKD (chronic kidney disease) stage III - Primary   Relevant Medications   lisinopril (ZESTRIL) 20 MG tablet   Other Visit Diagnoses     Essential hypertension       Relevant Medications   lisinopril (ZESTRIL) 20 MG tablet       DUe Shingrix vaccine 01/2021 medicare will now cover, can get at pharmacy hopefully free of charge.  Due COVID Booster updated Omicron bivalent version.  HTN W/ CKD III Complication Controlled previously, mild elevated today Re order Lisinopril 20mg  F/u Home BP readings  Future consider PSG  Hx Breast Cancer Check CA 27.29 yearly  Osteopenia Stable without fracture complication On last DEXA 2018, reconsider repeat if interested.  HLD Controlled on statin.  Atherosclerosis aorta Identified on CT Abd imaging 2021 see above On Statin Meds ordered this encounter  Medications   lisinopril (ZESTRIL) 20 MG tablet    Sig: Take 1 tablet (20 mg total) by mouth daily.    Dispense:  90 tablet    Refill:  3     Follow up plan: Return in  about 6 months (around 06/01/2021) for 6 month fasting lab 1 week then later Annual Physical.  Future labs ordered for 05/2021  Nobie Putnam, [facility] 12/02/2020, 9:25 AM

## 2020-12-13 ENCOUNTER — Ambulatory Visit: Payer: Medicare Other

## 2020-12-19 ENCOUNTER — Ambulatory Visit: Payer: Medicare Other | Admitting: Podiatry

## 2021-01-04 ENCOUNTER — Telehealth: Payer: Self-pay | Admitting: Family Medicine

## 2021-01-04 DIAGNOSIS — D485 Neoplasm of uncertain behavior of skin: Secondary | ICD-10-CM | POA: Diagnosis not present

## 2021-01-04 DIAGNOSIS — L720 Epidermal cyst: Secondary | ICD-10-CM | POA: Diagnosis not present

## 2021-01-04 DIAGNOSIS — I129 Hypertensive chronic kidney disease with stage 1 through stage 4 chronic kidney disease, or unspecified chronic kidney disease: Secondary | ICD-10-CM

## 2021-01-04 DIAGNOSIS — Z85828 Personal history of other malignant neoplasm of skin: Secondary | ICD-10-CM | POA: Diagnosis not present

## 2021-01-04 DIAGNOSIS — C4441 Basal cell carcinoma of skin of scalp and neck: Secondary | ICD-10-CM | POA: Diagnosis not present

## 2021-01-04 DIAGNOSIS — Z08 Encounter for follow-up examination after completed treatment for malignant neoplasm: Secondary | ICD-10-CM | POA: Diagnosis not present

## 2021-01-04 NOTE — Telephone Encounter (Signed)
Patient called stating she has been tracking her BP and it remains high. She was asking if she could possibly be put on a higher dose of lisinopril (ZESTRIL) 20 MG tablet

## 2021-01-05 MED ORDER — LISINOPRIL 40 MG PO TABS
40.0000 mg | ORAL_TABLET | Freq: Every day | ORAL | 3 refills | Status: DC
Start: 2021-01-05 — End: 2021-12-27

## 2021-01-05 NOTE — Telephone Encounter (Signed)
Alright. Thank you for update.  Please notify her that I have sent new rx Lisinopril 40mg  to her pharmacy. She can double up old 20mg  tabs or start new rx when ready  Keep monitoring BP and notify us if needed.  Nobie Putnam, [facility] 01/05/2021, 3:12 PM

## 2021-02-28 DIAGNOSIS — L988 Other specified disorders of the skin and subcutaneous tissue: Secondary | ICD-10-CM | POA: Diagnosis not present

## 2021-02-28 DIAGNOSIS — L578 Other skin changes due to chronic exposure to nonionizing radiation: Secondary | ICD-10-CM | POA: Diagnosis not present

## 2021-02-28 DIAGNOSIS — L814 Other melanin hyperpigmentation: Secondary | ICD-10-CM | POA: Diagnosis not present

## 2021-02-28 DIAGNOSIS — C4441 Basal cell carcinoma of skin of scalp and neck: Secondary | ICD-10-CM | POA: Diagnosis not present

## 2021-02-28 HISTORY — PX: MOHS SURGERY: SUR867

## 2021-03-01 ENCOUNTER — Other Ambulatory Visit: Payer: Self-pay | Admitting: Family Medicine

## 2021-03-01 DIAGNOSIS — G8929 Other chronic pain: Secondary | ICD-10-CM

## 2021-03-01 DIAGNOSIS — M159 Polyosteoarthritis, unspecified: Secondary | ICD-10-CM

## 2021-03-01 DIAGNOSIS — M545 Low back pain, unspecified: Secondary | ICD-10-CM

## 2021-03-01 NOTE — Telephone Encounter (Signed)
Requested medication (s) are due for refill today:   Provider to review  Requested medication (s) are on the active medication list:   Yes  Future visit scheduled:   Yes   Last ordered: 08/29/2020 #30, 2 refills  Non delegated refill   Requested Prescriptions  Pending Prescriptions Disp Refills   traMADol (ULTRAM) 50 MG tablet [Pharmacy Med Name: TRAMADOL 50MG  TABLETS] 30 tablet     Sig: TAKE 1 TABLET(50 MG) BY MOUTH EVERY 8 HOURS AS NEEDED FOR MODERATE PAIN     Not Delegated - Analgesics:  Opioid Agonists Failed - 03/01/2021  7:52 AM      Failed - This refill cannot be delegated      Failed - Urine Drug Screen completed in last 360 days      Passed - Valid encounter within last 3 months    Recent Outpatient Visits           2 months ago Benign hypertension with CKD (chronic kidney disease) stage III   [place], Devonne Doughty, DO   9 months ago Benign hypertension with CKD (chronic kidney disease) stage III   [facility]er, DO   1 year ago Liver cyst   [facility] Olin Hauser, DO   1 year ago Benign hypertension with CKD (chronic kidney disease) stage III   [place], DO   1 year ago Benign hypertension with CKD (chronic kidney disease) stage III   [facility], Devonne Doughty, DO       Future Appointments             In 3 months [place], Devonne Doughty, DO [facility], [facility]

## 2021-03-19 ENCOUNTER — Other Ambulatory Visit: Payer: Self-pay | Admitting: Family Medicine

## 2021-03-19 DIAGNOSIS — E785 Hyperlipidemia, unspecified: Secondary | ICD-10-CM

## 2021-03-20 NOTE — Telephone Encounter (Signed)
Pt has refills available to 06/02/21.  Requested Prescriptions  Pending Prescriptions Disp Refills   atorvastatin (LIPITOR) 10 MG tablet [Pharmacy Med Name: ATORVASTATIN 10MG  TABLETS] 90 tablet 3    Sig: TAKE 1 TABLET(10 MG) BY MOUTH AT BEDTIME     Cardiovascular:  Antilipid - Statins Failed - 03/19/2021 11:15 AM      Failed - Lipid Panel in normal range within the last 12 months    Cholesterol  Date Value Ref Range Status  05/25/2020 167 <200 mg/dL Final   LDL Cholesterol (Calc)  Date Value Ref Range Status  05/25/2020 79 mg/dL (calc) Final    Comment:    Reference range: <100 . Desirable range <100 mg/dL for primary prevention;   <70 mg/dL for patients with CHD or diabetic patients  with > or = 2 CHD risk factors. Marland Kitchen LDL-C is now calculated using the Martin-Hopkins  calculation, which is a validated novel method providing  better accuracy than the Friedewald equation in the  estimation of LDL-C.  Cresenciano Genre et al. Annamaria Helling. 1610;960(45): 2061-2068  (http://education.[employer].com/faq/FAQ164)    HDL  Date Value Ref Range Status  05/25/2020 67 > OR = 50 mg/dL Final   Triglycerides  Date Value Ref Range Status  05/25/2020 110 <150 mg/dL Final         Passed - Patient is not pregnant      Passed - Valid encounter within last 12 months    Recent Outpatient Visits          3 months ago Benign hypertension with CKD (chronic kidney disease) stage III   [facility] [place], Devonne Doughty, DO   9 months ago Benign hypertension with CKD (chronic kidney disease) stage III   [facility] Olin Hauser, DO   1 year ago Liver cyst   [facility] Olin Hauser, DO   1 year ago Benign hypertension with CKD (chronic kidney disease) stage III   [place], DO   1 year ago Benign hypertension with CKD (chronic kidney disease) stage III   [facility]  [place], Devonne Doughty, DO      Future Appointments            In 3 months [place], Devonne Doughty, DO [facility], [facility]

## 2021-03-27 ENCOUNTER — Other Ambulatory Visit: Payer: Self-pay

## 2021-03-27 ENCOUNTER — Ambulatory Visit (INDEPENDENT_AMBULATORY_CARE_PROVIDER_SITE_OTHER): Payer: Medicare Other

## 2021-03-27 DIAGNOSIS — Z0000 Encounter for general adult medical examination without abnormal findings: Secondary | ICD-10-CM | POA: Diagnosis not present

## 2021-03-27 NOTE — Progress Notes (Signed)
Virtual Visit via Telephone Note  I connected with [name] on 03/27/21 at  1:00 PM EST by telephone and verified that I am speaking with the correct person using two identifiers.  Location: Patient: [facility]  Provider: Nobie Putnam, DO   I discussed the limitations, risks, security and privacy concerns of performing an evaluation and management service by telephone and the availability of in person appointments. I also discussed with the patient that there may be a patient responsible charge related to this service. The patient expressed understanding and agreed to proceed.     I provided 30 minutes of non-face-to-face time during this encounter.   Wilson Singer, CMA   HPI:  Patient presents to clinic today for their subsequent annual Medicare wellness exam.  Past Medical History:  Diagnosis Date   Allergy    Breast cancer ([place]) 01/30/2012   left, radiation and lumpectomy   Hypertension    Personal history of radiation therapy     Current Outpatient Medications  Medication Sig Dispense Refill   atorvastatin (LIPITOR) 10 MG tablet Take 1 tablet (10 mg total) by mouth at bedtime. 90 tablet 3   lisinopril (ZESTRIL) 40 MG tablet Take 1 tablet (40 mg total) by mouth daily. 90 tablet 3   meloxicam (MOBIC) 15 MG tablet TAKE 1 TABLET BY MOUTH EVERY DAY AS NEEDED FOR PAIN. PREFERRED TO TAKE UP TO 1 TO 2 WEEKS AT A TIME THEN STOP. 30 tablet 3   Multiple Vitamins-Minerals (CENTRUM SILVER PO) Take 1 tablet by mouth daily.     traMADol (ULTRAM) 50 MG tablet Take 1 tablet (50 mg total) by mouth every 8 (eight) hours as needed for moderate pain. 30 tablet 2   calcium carbonate (OSCAL) 1500 (600 CA) MG TABS tablet Take 1 tablet by mouth every other day. (Patient not taking: Reported on 03/27/2021)     Calcium Carbonate-Vitamin D 600-200 MG-UNIT TABS Take by mouth. (Patient not taking: Reported on 03/27/2021)     cholecalciferol (VITAMIN D) 1000 UNITS tablet Take 1,000  Units by mouth daily. (Patient not taking: Reported on 03/27/2021)     co-enzyme Q-10 50 MG capsule Take 50 mg by mouth daily. (Patient not taking: Reported on 03/27/2021)     glucosamine-chondroitin 500-400 MG tablet Take 1 tablet by mouth 3 (three) times daily. (Patient not taking: Reported on 03/27/2021)     Omega-3 Krill Oil 300 MG CAPS Take 1 capsule by mouth daily. (Patient not taking: Reported on 03/27/2021)     Resveratrol 250 MG CAPS  (Patient not taking: Reported on 03/27/2021)     No current facility-administered medications for this visit.    Allergies  Allergen Reactions   Penicillins Rash    Family History  Problem Relation Age of Onset   Breast cancer Paternal Aunt 57   Osteoporosis Mother    Heart attack Father     Social History   Socioeconomic History   Marital status: Widowed    Spouse name: Not on file   Number of children: 1   Years of education: 61   Highest education level: High school graduate  Occupational History   Not on file  Tobacco Use   Smoking status: Never   Smokeless tobacco: Never  Vaping Use   Vaping Use: Never used  Substance and Sexual Activity   Alcohol use: No    Alcohol/week: 0.0 standard drinks   Drug use: No   Sexual activity: Never  Other Topics Concern  Not on file  Social History Narrative   Not on file   Social Determinants of Health   Financial Resource Strain: Low Risk    Difficulty of Paying Living Expenses: Not hard at all  Food Insecurity: No Food Insecurity   Worried About Charity fundraiser in the Last Year: Never true   Arboriculturist in the Last Year: Never true  Transportation Needs: Public librarian (Medical): Yes   Lack of Transportation (Non-Medical): Yes  Physical Activity: Insufficiently Active   Days of Exercise per Week: 7 days   Minutes of Exercise per Session: 20 min  Stress: No Stress Concern Present   Feeling of Stress : Not at all  Social Connections:  Socially Isolated   Frequency of Communication with Friends and Family: More than three times a week   Frequency of Social Gatherings with Friends and Family: More than three times a week   Attends Religious Services: Never   Marine scientist or Organizations: No   Attends Archivist Meetings: Never   Marital Status: Widowed  Intimate Partner Violence: Not on file    Hospitiliaztions:No hospitalization in the past 12 mths.   Mohs Surgery: 02/10/21 -Basal cell carcinoma on frontal scalp   Health Maintenance:    Flu: 10/10/20  Tetanus: 05/21/13  Pneumovax: 11/04/19  Prevnar: 01/14/13  Zostavax: 01/30/12  Shingrix: pt aware she can get at her pharmacy.  Covid: 11/04/19, 03/05/19, 02/12/19  Mammogram: no longer indicated   Pap Smear:no longer indicated    Bone Density: 10/07/16           Colon Screening: no longer indicated   [facility] Doctor: [facility]   Dental Exam:   Providers:   PCP: Nobie Putnam, DO  Dermatologist: Karle Barr, MD  Podiatry: Gardiner Barefoot, DPM  I have personally reviewed and have noted:  1. The patient's medical and social history 2. Their use of alcohol, tobacco or illicit drugs 3. Their current medications and supplements 4. The patient's functional ability including ADL's, fall risks, home safety risks and hearing or visual impairment. 5. Diet and physical activities 6. Evidence for depression or mood disorder  Subjective:   Review of Systems:   Constitutional: Denies fever, malaise, fatigue, headache or abrupt weight changes.  HEENT: Denies eye pain, eye redness, ear pain, ringing in the ears, wax buildup, runny nose, nasal congestion, bloody nose, or sore throat. Respiratory: Denies difficulty breathing, shortness of breath, cough or sputum production.   Cardiovascular: Denies chest pain, chest tightness, palpitations or swelling in the hands or feet.  Gastrointestinal: Denies abdominal pain, bloating, constipation,  diarrhea or blood in the stool.  GU: Denies urgency, frequency, pain with urination, burning sensation, blood in urine, odor or discharge. Musculoskeletal: Denies decrease in range of motion, difficulty with gait, muscle pain or joint pain and swelling.  Skin: Denies redness, rashes, lesions or ulcercations.  Neurological: Denies dizziness, difficulty with memory, difficulty with speech or problems with balance and coordination.  Psych: Denies anxiety, depression, SI/HI.  No other specific complaints in a complete review of systems (except as listed in HPI above).  Objective:  PE:   There were no vitals taken for this visit. Wt Readings from Last 3 Encounters:  12/02/20 183 lb 3.2 oz (83.1 kg)  06/02/20 179 lb (81.2 kg)  12/08/19 179 lb (81.2 kg)     BMET    Component Value Date/Time   NA 141 05/25/2020 0802  NA 137 10/13/2013 1120   K 4.3 05/25/2020 0802   K 4.3 10/13/2013 1120   CL 107 05/25/2020 0802   CL 102 10/13/2013 1120   CO2 25 05/25/2020 0802   CO2 27 10/13/2013 1120   GLUCOSE 87 05/25/2020 0802   GLUCOSE 103 (H) 10/13/2013 1120   BUN 21 05/25/2020 0802   BUN 24 (H) 10/13/2013 1120   CREATININE 0.97 (H) 05/25/2020 0802   CALCIUM 10.4 05/25/2020 0802   CALCIUM 10.1 10/13/2013 1120   GFRNONAA 54 (L) 05/25/2020 0802   GFRAA 63 05/25/2020 0802    Lipid Panel     Component Value Date/Time   CHOL 167 05/25/2020 0802   TRIG 110 05/25/2020 0802   HDL 67 05/25/2020 0802   CHOLHDL 2.5 05/25/2020 0802   VLDL 24 02/07/2016 0001   LDLCALC 79 05/25/2020 0802    CBC    Component Value Date/Time   WBC 4.2 05/25/2020 0802   RBC 4.47 05/25/2020 0802   HGB 13.7 05/25/2020 0802   HGB 13.7 10/13/2013 1120   HCT 41.6 05/25/2020 0802   HCT 41.1 10/13/2013 1120   PLT 188 05/25/2020 0802   PLT 194 10/13/2013 1120   MCV 93.1 05/25/2020 0802   MCV 94 10/13/2013 1120   MCH 30.6 05/25/2020 0802   MCHC 32.9 05/25/2020 0802   RDW 11.9 05/25/2020 0802   RDW 12.5  10/13/2013 1120   LYMPHSABS 815 (L) 05/25/2020 0802   LYMPHSABS 1.4 10/13/2013 1120   MONOABS 0.4 06/21/2019 2355   MONOABS 0.4 10/13/2013 1120   EOSABS 71 05/25/2020 0802   EOSABS 0.1 10/13/2013 1120   BASOSABS 21 05/25/2020 0802   BASOSABS 0.0 10/13/2013 1120    Hgb A1C Lab Results  Component Value Date   HGBA1C 5.4 05/25/2020      Assessment and Plan:   Medicare Annual Wellness Visit:  Diet: Heart healthy  Physical activity: Mild to moderate exercise  Depression/mood screen: Negative,  Flowsheet Row Clinical Support from 03/27/2021 in [place]  PHQ-9 Total Score 0      Hearing: Intact to whispered voice Visual acuity: Grossly normal, performs annual eye exam  ADLs: Capable Fall risk: Low Fall risk Home safety: Good Cognitive evaluation:  6CIT Screen 03/27/2021 12/08/2019 11/18/2018 08/13/2017 07/31/2016  What Year? 0 points 0 points 0 points 0 points 0 points  What month? 0 points 0 points 0 points 0 points 0 points  What time? 0 points 0 points 0 points 0 points 0 points  Count back from 20 0 points 0 points 0 points 0 points 0 points  Months in reverse 0 points 0 points 0 points 0 points 0 points  Repeat phrase 0 points 0 points 0 points 0 points 0 points  Total Score 0 0 0 0 0     EOL planning: Adv directives    Next appointment: Physical Scheduled on 06/19/2021   Wilson Singer, CMA

## 2021-06-04 ENCOUNTER — Other Ambulatory Visit: Payer: Self-pay | Admitting: Family Medicine

## 2021-06-04 DIAGNOSIS — E785 Hyperlipidemia, unspecified: Secondary | ICD-10-CM

## 2021-06-05 NOTE — Telephone Encounter (Signed)
Requested medication (s) are due for refill today - expired Rx ? ?Requested medication (s) are on the active medication list -yes ? ?Future visit scheduled -yes ? ?Last refill: 06/02/20 #90 3RF ? ?Notes to clinic: expired Rx, fails lab protocol ? ?Requested Prescriptions  ?Pending Prescriptions Disp Refills  ? atorvastatin (LIPITOR) 10 MG tablet [Pharmacy Med Name: ATORVASTATIN '10MG'$  TABLETS] 90 tablet 3  ?  Sig: TAKE 1 TABLET(10 MG) BY MOUTH AT BEDTIME  ?  ? Cardiovascular:  Antilipid - Statins Failed - 06/04/2021  1:16 PM  ?  ?  Failed - Lipid Panel in normal range within the last 12 months  ?  Cholesterol  ?Date Value Ref Range Status  ?05/25/2020 167 <200 mg/dL Final  ? ?LDL Cholesterol (Calc)  ?Date Value Ref Range Status  ?05/25/2020 79 mg/dL (calc) Final  ?  Comment:  ?  Reference range: <100 ?Marland Kitchen ?Desirable range <100 mg/dL for primary prevention;   ?<70 mg/dL for patients with CHD or diabetic patients  ?with > or = 2 CHD risk factors. ?. ?LDL-C is now calculated using the Martin-Hopkins  ?calculation, which is a validated novel method providing  ?better accuracy than the Friedewald equation in the  ?estimation of LDL-C.  ?Cresenciano Genre et al. Annamaria Helling. 7829;562(13): 2061-2068  ?(http://education.[facility].com/faq/FAQ164) ?  ? ?HDL  ?Date Value Ref Range Status  ?05/25/2020 67 > OR = 50 mg/dL Final  ? ?Triglycerides  ?Date Value Ref Range Status  ?05/25/2020 110 <150 mg/dL Final  ? ?  ?  ?  Passed - Patient is not pregnant  ?  ?  Passed - Valid encounter within last 12 months  ?  Recent Outpatient Visits   ? ?      ? 6 months ago Benign hypertension with CKD (chronic kidney disease) stage III  ? [place], DO  ? 1 year ago Benign hypertension with CKD (chronic kidney disease) stage III  ? [place], DO  ? 1 year ago Liver cyst  ? [place], DO  ? 1 year ago Benign hypertension with CKD  (chronic kidney disease) stage III  ? [place], DO  ? 1 year ago Benign hypertension with CKD (chronic kidney disease) stage III  ? [place], DO  ? ?  ?  ?Future Appointments   ? ?        ? In 2 weeks Parks Ranger Devonne Doughty, DO [facility] [place]  ? ?  ? ? ?  ?  ?  ? ? ? ?Requested Prescriptions  ?Pending Prescriptions Disp Refills  ? atorvastatin (LIPITOR) 10 MG tablet [Pharmacy Med Name: ATORVASTATIN '10MG'$  TABLETS] 90 tablet 3  ?  Sig: TAKE 1 TABLET(10 MG) BY MOUTH AT BEDTIME  ?  ? Cardiovascular:  Antilipid - Statins Failed - 06/04/2021  1:16 PM  ?  ?  Failed - Lipid Panel in normal range within the last 12 months  ?  Cholesterol  ?Date Value Ref Range Status  ?05/25/2020 167 <200 mg/dL Final  ? ?LDL Cholesterol (Calc)  ?Date Value Ref Range Status  ?05/25/2020 79 mg/dL (calc) Final  ?  Comment:  ?  Reference range: <100 ?Marland Kitchen ?Desirable range <100 mg/dL for primary prevention;   ?<70 mg/dL for patients with CHD or diabetic patients  ?with > or = 2 CHD risk factors. ?. ?LDL-C is now calculated  using the Martin-Hopkins  ?calculation, which is a validated novel method providing  ?better accuracy than the Friedewald equation in the  ?estimation of LDL-C.  ?Cresenciano Genre et al. Annamaria Helling. 4235;361(44): 2061-2068  ?(http://education.[facility].com/faq/FAQ164) ?  ? ?HDL  ?Date Value Ref Range Status  ?05/25/2020 67 > OR = 50 mg/dL Final  ? ?Triglycerides  ?Date Value Ref Range Status  ?05/25/2020 110 <150 mg/dL Final  ? ?  ?  ?  Passed - Patient is not pregnant  ?  ?  Passed - Valid encounter within last 12 months  ?  Recent Outpatient Visits   ? ?      ? 6 months ago Benign hypertension with CKD (chronic kidney disease) stage III  ? [employer], DO  ? 1 year ago Benign hypertension with CKD (chronic kidney disease) stage III  ? [place], DO   ? 1 year ago Liver cyst  ? [place], DO  ? 1 year ago Benign hypertension with CKD (chronic kidney disease) stage III  ? [place], DO  ? 1 year ago Benign hypertension with CKD (chronic kidney disease) stage III  ? [place], DO  ? ?  ?  ?Future Appointments   ? ?        ? In 2 weeks Parks Ranger Devonne Doughty, DO [facility] [place]  ? ?  ? ? ?  ?  ?  ? ? ? ?

## 2021-06-09 DIAGNOSIS — H2513 Age-related nuclear cataract, bilateral: Secondary | ICD-10-CM | POA: Diagnosis not present

## 2021-06-09 DIAGNOSIS — H18513 Endothelial corneal dystrophy, bilateral: Secondary | ICD-10-CM | POA: Diagnosis not present

## 2021-06-09 DIAGNOSIS — H43813 Vitreous degeneration, bilateral: Secondary | ICD-10-CM | POA: Diagnosis not present

## 2021-06-16 ENCOUNTER — Other Ambulatory Visit: Payer: Medicare Other

## 2021-06-16 DIAGNOSIS — I129 Hypertensive chronic kidney disease with stage 1 through stage 4 chronic kidney disease, or unspecified chronic kidney disease: Secondary | ICD-10-CM

## 2021-06-16 DIAGNOSIS — E559 Vitamin D deficiency, unspecified: Secondary | ICD-10-CM | POA: Diagnosis not present

## 2021-06-16 DIAGNOSIS — R7309 Other abnormal glucose: Secondary | ICD-10-CM

## 2021-06-16 DIAGNOSIS — N183 Chronic kidney disease, stage 3 unspecified: Secondary | ICD-10-CM | POA: Diagnosis not present

## 2021-06-16 DIAGNOSIS — Z853 Personal history of malignant neoplasm of breast: Secondary | ICD-10-CM

## 2021-06-16 DIAGNOSIS — E782 Mixed hyperlipidemia: Secondary | ICD-10-CM | POA: Diagnosis not present

## 2021-06-19 ENCOUNTER — Encounter: Payer: Self-pay | Admitting: Family Medicine

## 2021-06-19 ENCOUNTER — Ambulatory Visit (INDEPENDENT_AMBULATORY_CARE_PROVIDER_SITE_OTHER): Payer: Medicare Other | Admitting: Family Medicine

## 2021-06-19 VITALS — BP 138/84 | HR 87 | Ht 64.0 in | Wt 188.8 lb

## 2021-06-19 DIAGNOSIS — M159 Polyosteoarthritis, unspecified: Secondary | ICD-10-CM

## 2021-06-19 DIAGNOSIS — N183 Chronic kidney disease, stage 3 unspecified: Secondary | ICD-10-CM

## 2021-06-19 DIAGNOSIS — I700 Atherosclerosis of aorta: Secondary | ICD-10-CM | POA: Diagnosis not present

## 2021-06-19 DIAGNOSIS — M150 Primary generalized (osteo)arthritis: Secondary | ICD-10-CM

## 2021-06-19 DIAGNOSIS — N1831 Chronic kidney disease, stage 3a: Secondary | ICD-10-CM

## 2021-06-19 DIAGNOSIS — E039 Hypothyroidism, unspecified: Secondary | ICD-10-CM

## 2021-06-19 DIAGNOSIS — E782 Mixed hyperlipidemia: Secondary | ICD-10-CM

## 2021-06-19 DIAGNOSIS — I129 Hypertensive chronic kidney disease with stage 1 through stage 4 chronic kidney disease, or unspecified chronic kidney disease: Secondary | ICD-10-CM | POA: Diagnosis not present

## 2021-06-19 DIAGNOSIS — G8929 Other chronic pain: Secondary | ICD-10-CM | POA: Diagnosis not present

## 2021-06-19 DIAGNOSIS — Z23 Encounter for immunization: Secondary | ICD-10-CM

## 2021-06-19 DIAGNOSIS — Z85828 Personal history of other malignant neoplasm of skin: Secondary | ICD-10-CM | POA: Diagnosis not present

## 2021-06-19 DIAGNOSIS — M545 Low back pain, unspecified: Secondary | ICD-10-CM | POA: Diagnosis not present

## 2021-06-19 DIAGNOSIS — Z0000 Encounter for general adult medical examination without abnormal findings: Secondary | ICD-10-CM

## 2021-06-19 LAB — CBC WITH DIFFERENTIAL/PLATELET
Absolute Monocytes: 315 cells/uL (ref 200–950)
Basophils Absolute: 18 cells/uL (ref 0–200)
Basophils Relative: 0.4 %
Eosinophils Absolute: 81 cells/uL (ref 15–500)
Eosinophils Relative: 1.8 %
HCT: 39.2 % (ref 35.0–45.0)
Hemoglobin: 13.1 g/dL (ref 11.7–15.5)
Lymphs Abs: 941 cells/uL (ref 850–3900)
MCH: 31.6 pg (ref 27.0–33.0)
MCHC: 33.4 g/dL (ref 32.0–36.0)
MCV: 94.7 fL (ref 80.0–100.0)
MPV: 11 fL (ref 7.5–12.5)
Monocytes Relative: 7 %
Neutro Abs: 3146 cells/uL (ref 1500–7800)
Neutrophils Relative %: 69.9 %
Platelets: 182 10*3/uL (ref 140–400)
RBC: 4.14 10*6/uL (ref 3.80–5.10)
RDW: 11.9 % (ref 11.0–15.0)
Total Lymphocyte: 20.9 %
WBC: 4.5 10*3/uL (ref 3.8–10.8)

## 2021-06-19 LAB — COMPLETE METABOLIC PANEL WITH GFR
AG Ratio: 1.8 (calc) (ref 1.0–2.5)
ALT: 13 U/L (ref 6–29)
AST: 17 U/L (ref 10–35)
Albumin: 4.2 g/dL (ref 3.6–5.1)
Alkaline phosphatase (APISO): 76 U/L (ref 37–153)
BUN/Creatinine Ratio: 16 (calc) (ref 6–22)
BUN: 17 mg/dL (ref 7–25)
CO2: 24 mmol/L (ref 20–32)
Calcium: 9.9 mg/dL (ref 8.6–10.4)
Chloride: 107 mmol/L (ref 98–110)
Creat: 1.08 mg/dL — ABNORMAL HIGH (ref 0.60–0.95)
Globulin: 2.3 g/dL (calc) (ref 1.9–3.7)
Glucose, Bld: 95 mg/dL (ref 65–139)
Potassium: 4.6 mmol/L (ref 3.5–5.3)
Sodium: 141 mmol/L (ref 135–146)
Total Bilirubin: 0.7 mg/dL (ref 0.2–1.2)
Total Protein: 6.5 g/dL (ref 6.1–8.1)
eGFR: 51 mL/min/{1.73_m2} — ABNORMAL LOW (ref >=60)

## 2021-06-19 LAB — LIPID PANEL
Cholesterol: 166 mg/dL (ref <200)
HDL: 66 mg/dL (ref >=50)
LDL Cholesterol (Calc): 79 mg/dL (calc)
Non-HDL Cholesterol (Calc): 100 mg/dL (calc) (ref <130)
Total CHOL/HDL Ratio: 2.5 (calc) (ref <5.0)
Triglycerides: 116 mg/dL (ref <150)

## 2021-06-19 LAB — VITAMIN D 25 HYDROXY (VIT D DEFICIENCY, FRACTURES): Vit D, 25-Hydroxy: 58 ng/mL (ref 30–100)

## 2021-06-19 LAB — HEMOGLOBIN A1C
Hgb A1c MFr Bld: 5.3 % of total Hgb (ref <5.7)
Mean Plasma Glucose: 105 mg/dL
eAG (mmol/L): 5.8 mmol/L

## 2021-06-19 LAB — CANCER ANTIGEN 27.29: CA 27.29: 28 U/mL (ref <38)

## 2021-06-19 LAB — TSH: TSH: 4.18 mIU/L (ref 0.40–4.50)

## 2021-06-19 MED ORDER — LEVOTHYROXINE SODIUM 25 MCG PO TABS: 25.0000 ug | ORAL_TABLET | Freq: Every day | ORAL | 0 refills | Status: DC

## 2021-06-19 NOTE — Patient Instructions (Addendum)
Thank you for coming to the office today.  Start low dose Levothyroxine 79mg daily for 3 months, we can double check thyroid lab at that time.  [id number] pneumonia dose today  Repeat shingles 2-6 months after your last dose 06/05/21  Keep on supplements and pain medication as you, 0 refills on Tramadol now, if you need new order, let me know.   DUE for FASTING BLOOD WORK (no food or drink after midnight before the lab appointment, only water or coffee without cream/sugar on the morning of)  SCHEDULE "Lab Only" visit in the morning at the clinic for lab draw in 3  MONTHS   - Make sure Lab Only appointment is at about 1 week before your next appointment, so that results will be available  For Lab Results, once available within 2-3 days of blood draw, you can can log in to MyChart online to view your results and a brief explanation. Also, we can discuss results at next follow-up visit.   Please schedule a Follow-up Appointment to: Return in about 3 months (around 09/19/2021) for 3 month fasting lab only then 1 week later Thyroid labs / results..  If you have any other questions or concerns, please feel free to call the office or send a message through [facility] You may also schedule an earlier appointment if necessary.  Additionally, you may be receiving a survey about your experience at our office within a few days to 1 week by e-mail or mail. We value your feedback.  ANobie Putnam DO [place]

## 2021-06-19 NOTE — Progress Notes (Addendum)
Subjective:    Patient ID: Angela Bullock, female    DOB: 03-29-1935, 86 y.o.   MRN: [id number]  Angela Bullock is a 86 y.o. female presenting on 06/19/2021 for Hypertension   HPI  Follow-up liver cyst ultrasound Recent results showed normal liver tissue on [place], no sign of cirrhosis or other liver disease, had . 3.6 x 2.4 x 3.5 cm hypoechoic lesion in the anterior aspect of the left lobe of the liver on [place]. They recommended MRI. - She is doing well asymptomatic Last imaging update MRI 11/2019 see below, unremarkable, stable   Chronic Pain Multiple Joints / Osteoarthritis Multiple joints (knees, back) Doing well currently on Tramadol PRN, takes infrequently 1 dose prior to days she will be more active and it has major benefit on reducing pain and allowing her to function and improves her mood as well. With CKD she was not taking Ibuprofen or NSAID anymore Now taking Tramadol 50mg  once daily She admits was taking less Tramadol when taking her supplements including glucosamine-chondroitin But admits some diarrhea side effect   CHRONIC HTN / CKD-III Lab showed showed Cr 1.08 eGFR 51 Current Meds - Lisinopril 20mg  daily currently needs refill Reports good compliance, took meds today. Tolerating well, w/o complaints. Interval update, has had Renal US - demonstrated resolved hydronephrosis, prior nephrolithiasis s/p resolved. She has not had any further urinary problem. Walking more regularly Denies CP, dyspnea, HA, edema, dizziness / lightheadedness   HYPERLIPIDEMIA: - Reports no concerns. Last lipid panel 05/2021, controlled on statin - Currently taking Atorvastatin 10mg , tolerating well without side effects or myalgias   Breast Cancer Surveillance See prior history Update w/ CA27.29 lab negative 04/2020   Aortic Atherosclerosis See HLD above Seen on imaging CT previously documented. On Statin  Hypothyroidism New finding, admits some diet changes but unable to lose weight, she had  labs showed TSH up to >4 now on recent, never diagnosed but in past she was on low dose Synthroid with improvement. She asks about restarting today   Health Maintenance:  COVID series 2021 and booster 10/2019, consider new covid update.   She had prior Zostavax.   Osteopenia R Femur/Hip She has had prior DEXA 2018, had mild osteopenia R femoral neck., otherwise normal. She will consider repeat DEXA. Fam with osteoporosis. Has had a fall but no injury.  Shingrix vaccine 06/05/21     06/19/2021   11:00 AM 03/27/2021    1:15 PM 12/08/2019   10:49 AM  Depression screen PHQ 2/9  Decreased Interest 0 0 0  Down, Depressed, Hopeless 0 0 0  PHQ - 2 Score 0 0 0  Altered sleeping 0 0   Tired, decreased energy 0 0   Change in appetite 0 0   Feeling bad or failure about yourself  0 0   Trouble concentrating 0 0   Moving slowly or fidgety/restless 0 0   Suicidal thoughts 0 0   PHQ-9 Score 0 0   Difficult doing work/chores Not difficult at all Not difficult at all     Past Medical History:  Diagnosis Date   Allergy    Breast cancer ([place]) 01/30/2012   left, radiation and lumpectomy   Hypertension    Personal history of radiation therapy    Past Surgical History:  Procedure Laterality Date   ABDOMINAL HYSTERECTOMY     BREAST BIOPSY Left 01/30/2011   negative   BREAST BIOPSY Left 01/30/2012   positive   BREAST CYST ASPIRATION Left  BREAST LUMPECTOMY Left 04/02/2012   positive   COLONOSCOPY WITH PROPOFOL N/A 07/23/2017   Procedure: COLONOSCOPY WITH PROPOFOL WITH BIOPSIES;  Surgeon: Jonathon Bellows, MD;  Location: [facility] ENDOSCOPY;  Service: Gastroenterology;  Laterality: N/A;   MOHS SURGERY  02/28/2021   Social History   Socioeconomic History   Marital status: Widowed    Spouse name: Not on file   Number of children: 1   Years of education: 13   Highest education level: High school graduate  Occupational History   Not on file  Tobacco Use   Smoking status: Never   Smokeless  tobacco: Never  Vaping Use   Vaping Use: Never used  Substance and Sexual Activity   Alcohol use: No    Alcohol/week: 0.0 standard drinks   Drug use: No   Sexual activity: Never  Other Topics Concern   Not on file  Social History Narrative   Not on file   Social Determinants of Health   Financial Resource Strain: Low Risk    Difficulty of Paying Living Expenses: Not hard at all  Food Insecurity: No Food Insecurity   Worried About Charity fundraiser in the Last Year: Never true   Arboriculturist in the Last Year: Never true  Transportation Needs: Unmet Transportation Needs   Lack of Transportation (Medical): Yes   Lack of Transportation (Non-Medical): Yes  Physical Activity: Insufficiently Active   Days of Exercise per Week: 7 days   Minutes of Exercise per Session: 20 min  Stress: No Stress Concern Present   Feeling of Stress : Not at all  Social Connections: Socially Isolated   Frequency of Communication with Friends and Family: More than three times a week   Frequency of Social Gatherings with Friends and Family: More than three times a week   Attends Religious Services: Never   Marine scientist or Organizations: No   Attends Archivist Meetings: Never   Marital Status: Widowed  Human resources officer Violence: Not on file   Family History  Problem Relation Age of Onset   Breast cancer Paternal Aunt 65   Osteoporosis Mother    Heart attack Father    Current Outpatient Medications on File Prior to Visit  Medication Sig   atorvastatin (LIPITOR) 10 MG tablet TAKE 1 TABLET(10 MG) BY MOUTH AT BEDTIME   calcium carbonate (OSCAL) 1500 (600 CA) MG TABS tablet Take 1 tablet by mouth every other day.   Calcium Carbonate-Vitamin D 600-200 MG-UNIT TABS Take by mouth.   cholecalciferol (VITAMIN D) 1000 UNITS tablet Take 1,000 Units by mouth daily.   co-enzyme Q-10 50 MG capsule Take 50 mg by mouth daily.   glucosamine-chondroitin 500-400 MG tablet Take 1 tablet by  mouth 3 (three) times daily.   lisinopril (ZESTRIL) 40 MG tablet Take 1 tablet (40 mg total) by mouth daily.   meloxicam (MOBIC) 15 MG tablet TAKE 1 TABLET BY MOUTH EVERY DAY AS NEEDED FOR PAIN. PREFERRED TO TAKE UP TO 1 TO 2 WEEKS AT A TIME THEN STOP.   Multiple Vitamins-Minerals (CENTRUM SILVER PO) Take 1 tablet by mouth daily.   Omega-3 Krill Oil 300 MG CAPS Take 1 capsule by mouth daily.   Resveratrol 250 MG CAPS    traMADol (ULTRAM) 50 MG tablet Take 1 tablet (50 mg total) by mouth every 8 (eight) hours as needed for moderate pain.   No current facility-administered medications on file prior to visit.    Review of Systems  Constitutional:  Negative for activity change, appetite change, chills, diaphoresis, fatigue and fever.  HENT:  Negative for congestion and hearing loss.   Eyes:  Negative for visual disturbance.  Respiratory:  Negative for cough, chest tightness, shortness of breath and wheezing.   Cardiovascular:  Negative for chest pain, palpitations and leg swelling.  Gastrointestinal:  Negative for abdominal pain, constipation, diarrhea, nausea and vomiting.  Genitourinary:  Negative for dysuria, frequency and hematuria.  Musculoskeletal:  Negative for arthralgias and neck pain.  Skin:  Negative for rash.  Neurological:  Negative for dizziness, weakness, light-headedness, numbness and headaches.  Hematological:  Negative for adenopathy.  Psychiatric/Behavioral:  Negative for behavioral problems, dysphoric mood and sleep disturbance.   Per HPI unless specifically indicated above      Objective:    BP 138/84   Pulse 87   Ht $R'5\' 4"'OP$  (1.626 m)   Wt 188 lb 12.8 oz (85.6 kg)   SpO2 99%   BMI 32.41 kg/m   Wt Readings from Last 3 Encounters:  06/19/21 188 lb 12.8 oz (85.6 kg)  12/02/20 183 lb 3.2 oz (83.1 kg)  06/02/20 179 lb (81.2 kg)    Physical Exam Vitals and nursing note reviewed.  Constitutional:      General: She is not in acute distress.    Appearance: She is  well-developed. She is not diaphoretic.     Comments: Well-appearing, comfortable, cooperative  HENT:     Head: Normocephalic and atraumatic.  Eyes:     General:        Right eye: No discharge.        Left eye: No discharge.     Conjunctiva/sclera: Conjunctivae normal.     Pupils: Pupils are equal, round, and reactive to light.  Neck:     Thyroid: No thyromegaly.     Vascular: No carotid bruit.  Cardiovascular:     Rate and Rhythm: Normal rate and regular rhythm.     Pulses: Normal pulses.     Heart sounds: Normal heart sounds. No murmur heard. Pulmonary:     Effort: Pulmonary effort is normal. No respiratory distress.     Breath sounds: Normal breath sounds. No wheezing or rales.  Abdominal:     General: Bowel sounds are normal. There is no distension.     Palpations: Abdomen is soft. There is no mass.     Tenderness: There is no abdominal tenderness.  Musculoskeletal:        General: No tenderness. Normal range of motion.     Cervical back: Normal range of motion and neck supple.     Right lower leg: No edema.     Left lower leg: No edema.     Comments: Upper / Lower Extremities: - Normal muscle tone, strength bilateral upper extremities 5/5, lower extremities 5/5  Lymphadenopathy:     Cervical: No cervical adenopathy.  Skin:    General: Skin is warm and dry.     Findings: No erythema or rash.     Comments: Top of scalp scab/healing tissue no open ulceration, dissolvable suture.  Thinning female pattern hair loss on scalp  Neurological:     Mental Status: She is alert and oriented to person, place, and time.     Comments: Distal sensation intact to light touch all extremities  Psychiatric:        Mood and Affect: Mood normal.        Behavior: Behavior normal.        Thought Content: Thought content normal.  Comments: Well groomed, good eye contact, normal speech and thoughts     Results for orders placed or performed in visit on 06/16/21  Cancer antigen 27.29   Result Value Ref Range   CA 27.29 28 <38 U/mL  TSH  Result Value Ref Range   TSH 4.18 0.40 - 4.50 mIU/L  VITAMIN D 25 Hydroxy (Vit-D Deficiency, Fractures)  Result Value Ref Range   Vit D, 25-Hydroxy 58 30 - 100 ng/mL  Hemoglobin A1c  Result Value Ref Range   Hgb A1c MFr Bld 5.3 <5.7 % of total Hgb   Mean Plasma Glucose 105 mg/dL   eAG (mmol/L) 5.8 mmol/L  CBC with Differential/Platelet  Result Value Ref Range   WBC 4.5 3.8 - 10.8 Thousand/uL   RBC 4.14 3.80 - 5.10 Million/uL   Hemoglobin 13.1 11.7 - 15.5 g/dL   HCT 39.2 35.0 - 45.0 %   MCV 94.7 80.0 - 100.0 fL   MCH 31.6 27.0 - 33.0 pg   MCHC 33.4 32.0 - 36.0 g/dL   RDW 11.9 11.0 - 15.0 %   Platelets 182 140 - 400 Thousand/uL   MPV 11.0 7.5 - 12.5 fL   Neutro Abs 3,146 1,500 - 7,800 cells/uL   Lymphs Abs 941 850 - 3,900 cells/uL   Absolute Monocytes 315 200 - 950 cells/uL   Eosinophils Absolute 81 15 - 500 cells/uL   Basophils Absolute 18 0 - 200 cells/uL   Neutrophils Relative % 69.9 %   Total Lymphocyte 20.9 %   Monocytes Relative 7.0 %   Eosinophils Relative 1.8 %   Basophils Relative 0.4 %  Lipid panel  Result Value Ref [place]   Cholesterol 166 <200 mg/dL   HDL 66 > OR = 50 mg/dL   Triglycerides 116 <150 mg/dL   LDL Cholesterol (Calc) 79 mg/dL (calc)   Total CHOL/HDL Ratio 2.5 <5.0 (calc)   Non-HDL Cholesterol (Calc) 100 <130 mg/dL (calc)  COMPLETE METABOLIC PANEL WITH GFR  Result Value Ref Range   Glucose, Bld 95 65 - 139 mg/dL   BUN 17 7 - 25 mg/dL   Creat 1.08 (H) 0.60 - 0.95 mg/dL   eGFR 51 (L) > OR = 60 mL/min/1.36m2   BUN/Creatinine Ratio 16 6 - 22 (calc)   Sodium 141 135 - 146 mmol/L   Potassium 4.6 3.5 - 5.3 mmol/L   Chloride 107 98 - 110 mmol/L   CO2 24 20 - 32 mmol/L   Calcium 9.9 8.6 - 10.4 mg/dL   Total Protein 6.5 6.1 - 8.1 g/dL   Albumin 4.2 3.6 - 5.1 g/dL   Globulin 2.3 1.9 - 3.7 g/dL (calc)   AG Ratio 1.8 1.0 - 2.5 (calc)   Total Bilirubin 0.7 0.2 - 1.2 mg/dL   Alkaline phosphatase  (APISO) 76 37 - 153 U/L   AST 17 10 - 35 U/L   ALT 13 6 - 29 U/L      Assessment & Plan:   Problem List Items Addressed This Visit     Osteoarthritis, multiple sites    Stable chronic OA/DJD multiple joints Without recent flare No recent imaging, would benefit from x-ray for characterizing arthritis  Continue OTC supplements glucosamine/chondroitin and Resveratrol   Tramadol PRN with improved functional quality of life Limit NSAIDs       Hyperlipidemia    Controlled cholesterol on statin and lifestyle  Plan: 1. Continue current meds - Atorvastatin $RemoveBefore'10mg'[id number]$  daily 2. Continue ASA $RemoveBefo'81mg'VhymwfOEPHM$  for primary ASCVD risk reduction 3. Encourage improved lifestyle -  low carb/cholesterol, reduce portion size, continue improving regular exercise 4. Follow-up yearly lipids       CKD (chronic kidney disease) stage 3, GFR 30-59 ml/min (HCC)   Chronic low back pain without sciatica   Benign hypertension with CKD (chronic kidney disease) stage III    Controlled HTN, repeat manual improved - Home BP readings normal  Complication with CKD-III - Cr improved today OFF BB Metoprolol   Plan:  1. Continue Lisinopril 20mg  daily refill 2. Encourage improved lifestyle - low sodium diet, regular exercise 3. Continue monitor BP outside office, bring readings to next visit, if persistently >140/90 or new symptoms notify office sooner 4. F/u 6 months  Reconsider ECHOcardiogram given heart murmur and ultimately we agree to defer for now, she is asymptomatic.       Other Visit Diagnoses     Annual physical exam    -  Primary   Atherosclerosis of aorta ([place])       Acquired hypothyroidism       Relevant Medications   levothyroxine (SYNTHROID) 25 MCG tablet   Need for vaccination for Strep pneumoniae       Relevant Orders   Pneumococcal conjugate vaccine 20-valent (Completed)   History of basal cell carcinoma (BCC)           Updated Health Maintenance information Reviewed recent lab results with  patient Encouraged improvement to lifestyle with diet and exercise Goal of weight loss  Atheroclerosis On imaging Taking Statin  Orders Placed This Encounter  Procedures   Pneumococcal conjugate vaccine 20-valent      Meds ordered this encounter  Medications   levothyroxine (SYNTHROID) 25 MCG tablet    Sig: Take 1 tablet (25 mcg total) by mouth daily before breakfast.    Dispense:  90 tablet    Refill:  0     Follow up plan: Return in about 3 months (around 09/19/2021) for 3 month fasting lab only then 1 week later Thyroid labs / results.Nobie Putnam, DO [facility] 06/19/2021, 10:01 AM

## 2021-06-20 ENCOUNTER — Other Ambulatory Visit: Payer: Self-pay | Admitting: Family Medicine

## 2021-06-20 DIAGNOSIS — E039 Hypothyroidism, unspecified: Secondary | ICD-10-CM

## 2021-06-20 NOTE — Assessment & Plan Note (Signed)
Stable chronic OA/DJD multiple joints Without recent flare No recent imaging, would benefit from x-ray for characterizing arthritis  Continue OTC supplements glucosamine/chondroitin and Resveratrol   Tramadol PRN with improved functional quality of life Limit NSAIDs

## 2021-06-20 NOTE — Assessment & Plan Note (Signed)
Controlled cholesterol on statin and lifestyle  Plan: 1. Continue current meds - Atorvastatin '10mg'$  daily 2. Continue ASA '81mg'$  for primary ASCVD risk reduction 3. Encourage improved lifestyle - low carb/cholesterol, reduce portion size, continue improving regular exercise 4. Follow-up yearly lipids

## 2021-06-20 NOTE — Assessment & Plan Note (Signed)
Controlled HTN, repeat manual improved - Home BP readings normal  Complication with CKD-III - Cr improved today OFF BB Metoprolol   Plan:  1. Continue Lisinopril '20mg'$  daily refill 2. Encourage improved lifestyle - low sodium diet, regular exercise 3. Continue monitor BP outside office, bring readings to next visit, if persistently >140/90 or new symptoms notify office sooner 4. F/u 6 months  Reconsider ECHOcardiogram given heart murmur and ultimately we agree to defer for now, she is asymptomatic.

## 2021-06-23 NOTE — Addendum Note (Signed)
Addended by: Olin Hauser on: 06/23/2021 01:26 PM   Modules accepted: Level of Service

## 2021-07-05 DIAGNOSIS — L821 Other seborrheic keratosis: Secondary | ICD-10-CM | POA: Diagnosis not present

## 2021-07-05 DIAGNOSIS — B353 Tinea pedis: Secondary | ICD-10-CM | POA: Diagnosis not present

## 2021-07-05 DIAGNOSIS — Z85828 Personal history of other malignant neoplasm of skin: Secondary | ICD-10-CM | POA: Diagnosis not present

## 2021-07-05 DIAGNOSIS — Z08 Encounter for follow-up examination after completed treatment for malignant neoplasm: Secondary | ICD-10-CM | POA: Diagnosis not present

## 2021-09-03 ENCOUNTER — Other Ambulatory Visit: Payer: Self-pay | Admitting: Family Medicine

## 2021-09-03 DIAGNOSIS — M545 Low back pain, unspecified: Secondary | ICD-10-CM

## 2021-09-03 DIAGNOSIS — M159 Polyosteoarthritis, unspecified: Secondary | ICD-10-CM

## 2021-09-03 DIAGNOSIS — G8929 Other chronic pain: Secondary | ICD-10-CM

## 2021-09-04 NOTE — Telephone Encounter (Signed)
Requested medication (s) are due for refill today: yes  Requested medication (s) are on the active medication list: yes  Last refill:  03/01/21 #30/2  Future visit scheduled: yes  Notes to clinic:  Unable to refill per protocol, cannot delegate.      Requested Prescriptions  Pending Prescriptions Disp Refills   traMADol (ULTRAM) 50 MG tablet [Pharmacy Med Name: TRAMADOL '50MG'$  TABLETS] 30 tablet     Sig: TAKE 1 TABLET(50 MG) BY MOUTH EVERY 8 HOURS AS NEEDED FOR MODERATE PAIN     Not Delegated - Analgesics:  Opioid Agonists Failed - 09/03/2021 11:34 AM      Failed - This refill cannot be delegated      Failed - Urine Drug Screen completed in last 360 days      Passed - Valid encounter within last 3 months    Recent Outpatient Visits           2 months ago Annual physical exam   [facility] Olin Hauser, DO   9 months ago Benign hypertension with CKD (chronic kidney disease) stage III   [place], DO   1 year ago Benign hypertension with CKD (chronic kidney disease) stage III   [place], DO   1 year ago Liver cyst   [place], DO   2 years ago Benign hypertension with CKD (chronic kidney disease) stage III   [facility] [place], Devonne Doughty, DO       Future Appointments             In 2 weeks Parks Ranger, Devonne Doughty, DO [facility], [facility]

## 2021-09-12 ENCOUNTER — Telehealth: Payer: Self-pay | Admitting: Family Medicine

## 2021-09-12 DIAGNOSIS — E039 Hypothyroidism, unspecified: Secondary | ICD-10-CM

## 2021-09-12 MED ORDER — LEVOTHYROXINE SODIUM 25 MCG PO TABS: 25.0000 ug | ORAL_TABLET | Freq: Every day | ORAL | 3 refills | Status: DC

## 2021-09-12 NOTE — Telephone Encounter (Signed)
Pt have a lab appt tomorrow said that she will be out of thyroid medicine before her  appt with you she wanted to know if you would be calling something in

## 2021-09-13 ENCOUNTER — Other Ambulatory Visit: Payer: Medicare Other

## 2021-09-13 DIAGNOSIS — E039 Hypothyroidism, unspecified: Secondary | ICD-10-CM | POA: Diagnosis not present

## 2021-09-14 LAB — TSH: TSH: 2.4 mIU/L (ref 0.40–4.50)

## 2021-09-14 LAB — T4, FREE: Free T4: 1.3 ng/dL (ref 0.8–1.8)

## 2021-09-15 ENCOUNTER — Encounter: Payer: Self-pay | Admitting: Family Medicine

## 2021-09-20 ENCOUNTER — Ambulatory Visit: Payer: Medicare Other | Admitting: Family Medicine

## 2021-09-25 ENCOUNTER — Ambulatory Visit: Payer: Medicare Other | Admitting: Family Medicine

## 2021-12-27 ENCOUNTER — Other Ambulatory Visit: Payer: Self-pay | Admitting: Family Medicine

## 2021-12-27 DIAGNOSIS — I129 Hypertensive chronic kidney disease with stage 1 through stage 4 chronic kidney disease, or unspecified chronic kidney disease: Secondary | ICD-10-CM

## 2021-12-27 NOTE — Telephone Encounter (Signed)
Requested medication (s) are due for refill today: yes  Requested medication (s) are on the active medication list: yes  Last refill:  01/05/21 #90 3 RF  Future visit scheduled: yes  Notes to clinic:  overdue lab work   Requested Prescriptions  Pending Prescriptions Disp Refills   lisinopril (ZESTRIL) 40 MG tablet [Pharmacy Med Name: LISINOPRIL '40MG'$  TABLETS] 90 tablet 3    Sig: TAKE 1 TABLET(40 MG) BY MOUTH DAILY     Cardiovascular:  ACE Inhibitors Failed - 12/27/2021 11:12 AM      Failed - Cr in normal range and within 180 days    Creat  Date Value Ref Range Status  06/16/2021 1.08 (H) 0.60 - 0.95 mg/dL Final         Failed - K in normal range and within 180 days    Potassium  Date Value Ref Range Status  06/16/2021 4.6 3.5 - 5.3 mmol/L Final  10/13/2013 4.3 3.5 - 5.1 mmol/L Final         Failed - Valid encounter within last 6 months    Recent Outpatient Visits           6 months ago Annual physical exam   [facility] Olin Hauser, DO   1 year ago Benign hypertension with CKD (chronic kidney disease) stage III   [facility] [place], Devonne Doughty, DO   1 year ago Benign hypertension with CKD (chronic kidney disease) stage III   [facility] Olin Hauser, DO   2 years ago Liver cyst   [place], DO   2 years ago Benign hypertension with CKD (chronic kidney disease) stage III   [place], DO       Future Appointments             In 6 days Parks Ranger, Devonne Doughty, DO [facility], [place] - Patient is not pregnant      Passed - Last BP in normal range    BP Readings from Last 1 Encounters:  06/19/21 138/84

## 2021-12-28 DIAGNOSIS — Z23 Encounter for immunization: Secondary | ICD-10-CM | POA: Diagnosis not present

## 2022-01-02 ENCOUNTER — Other Ambulatory Visit: Payer: Self-pay | Admitting: Family Medicine

## 2022-01-02 ENCOUNTER — Encounter: Payer: Self-pay | Admitting: Family Medicine

## 2022-01-02 ENCOUNTER — Ambulatory Visit (INDEPENDENT_AMBULATORY_CARE_PROVIDER_SITE_OTHER): Payer: Medicare Other | Admitting: Family Medicine

## 2022-01-02 VITALS — BP 138/70 | HR 90 | Ht 64.0 in | Wt 184.0 lb

## 2022-01-02 DIAGNOSIS — M159 Polyosteoarthritis, unspecified: Secondary | ICD-10-CM

## 2022-01-02 DIAGNOSIS — E782 Mixed hyperlipidemia: Secondary | ICD-10-CM

## 2022-01-02 DIAGNOSIS — M25561 Pain in right knee: Secondary | ICD-10-CM | POA: Diagnosis not present

## 2022-01-02 DIAGNOSIS — G8929 Other chronic pain: Secondary | ICD-10-CM

## 2022-01-02 DIAGNOSIS — I129 Hypertensive chronic kidney disease with stage 1 through stage 4 chronic kidney disease, or unspecified chronic kidney disease: Secondary | ICD-10-CM

## 2022-01-02 DIAGNOSIS — Z853 Personal history of malignant neoplasm of breast: Secondary | ICD-10-CM

## 2022-01-02 DIAGNOSIS — M545 Low back pain, unspecified: Secondary | ICD-10-CM | POA: Diagnosis not present

## 2022-01-02 DIAGNOSIS — E039 Hypothyroidism, unspecified: Secondary | ICD-10-CM | POA: Diagnosis not present

## 2022-01-02 DIAGNOSIS — E559 Vitamin D deficiency, unspecified: Secondary | ICD-10-CM

## 2022-01-02 DIAGNOSIS — R7309 Other abnormal glucose: Secondary | ICD-10-CM

## 2022-01-02 DIAGNOSIS — N183 Chronic kidney disease, stage 3 unspecified: Secondary | ICD-10-CM

## 2022-01-02 DIAGNOSIS — M25562 Pain in left knee: Secondary | ICD-10-CM | POA: Diagnosis not present

## 2022-01-02 DIAGNOSIS — K7689 Other specified diseases of liver: Secondary | ICD-10-CM | POA: Diagnosis not present

## 2022-01-02 MED ORDER — TRAMADOL HCL 50 MG PO TABS: 50.0000 mg | ORAL_TABLET | Freq: Three times a day (TID) | ORAL | 5 refills | Status: DC | PRN

## 2022-01-02 NOTE — Assessment & Plan Note (Signed)
Controlled HTN, repeat manual improved, she will get a wrist cuff at home - Home BP readings normal Complication with CKD-III - Cr improved today OFF BB Metoprolol   Plan:  1. Continue Lisinopril '40mg'$  daily 2. Encourage improved lifestyle - low sodium diet, regular exercise 3. Continue monitor BP outside office, bring readings to next visit, if persistently >140/90 or new symptoms notify office sooner 4. F/u 6 months  Reconsider ECHOcardiogram given heart murmur and ultimately we agree to defer for now, she is asymptomatic.

## 2022-01-02 NOTE — Assessment & Plan Note (Signed)
Review of prior imaging results initial CT incidental finding 05/2019, had ultrasound then MRI 11/2019 At this point, appears to not have any high risk or concerning features, radiology suggests that it can be considered to repeat imaging, patient is not interested in repeat imaging, and I would be comfortable in agreeing that she can monitor for any concerns or symptoms

## 2022-01-02 NOTE — Patient Instructions (Addendum)
Thank you for coming to the office today.  Keep on current dose Levothyroxine 45mg daily Thyroid labs look great  Continue Tylenol and your supplements Refilled Tramadol - take as needed Okay to rarely take Meloxicam  BP improved on recheck Okay to get a Wrist cuff   DUE for FASTING BLOOD WORK (no food or drink after midnight before the lab appointment, only water or coffee without cream/sugar on the morning of)  SCHEDULE "Lab Only" visit in the morning at the clinic for lab draw in  6 MONTHS   - Make sure Lab Only appointment is at about 1 week before your next appointment, so that results will be available  For Lab Results, once available within 2-3 days of blood draw, you can can log in to MyChart online to view your results and a brief explanation. Also, we can discuss results at next follow-up visit.   Please schedule a Follow-up Appointment to: Return in about 6 months (around 07/04/2022) for 6 month fasting lab only then 1 week later Annual Medicare Physical.  If you have any other questions or concerns, please feel free to call the office or send a message through [facility] You may also schedule an earlier appointment if necessary.  Additionally, you may be receiving a survey about your experience at our office within a few days to 1 week by e-mail or mail. We value your feedback.  ANobie Putnam DO [facility]

## 2022-01-02 NOTE — Assessment & Plan Note (Signed)
Hypothyroidism, significantly improved and controlled TSH and T4 normal range Symptoms improved on low dose Continue Levothyroxine 61mg daily Future reconsider dose inc if indicated Rpt labs 6 months

## 2022-01-02 NOTE — Assessment & Plan Note (Signed)
Stable chronic OA/DJD multiple joints Without recent flare No recent imaging, would benefit from x-ray for characterizing arthritis  Continue OTC supplements glucosamine/chondroitin and Resveratrol Omega 3, improved  Tramadol PRN with improved functional quality of life, renew Use Tylenol AS NEEDED, Limit NSAIDs

## 2022-01-02 NOTE — Progress Notes (Signed)
Subjective:    Patient ID: Angela Bullock, female    DOB: 02-Sep-1935, 86 y.o.   MRN: [id number]  Angela Bullock is a 86 y.o. female presenting on 01/02/2022 for Hypothyroidism and Hypertension   HPI  Follow-up liver cyst ultrasound Recent results showed normal liver tissue on [place], no sign of cirrhosis or other liver disease, had . 3.6 x 2.4 x 3.5 cm hypoechoic lesion in the anterior aspect of the left lobe of the liver on [place]. They recommended MRI. - She is doing well asymptomatic Last imaging update MRI 11/2019 see below, unremarkable, stable Last imaging [place] after the MRI, stable, future reconsider MRI   Chronic Pain Multiple Joints / Osteoarthritis Multiple joints (knees, back) Currently doing well, she admits if active physically will get some pain and symptoms from her back and other joints. She takes something every day. Tylenol often, and then will take Tramadol AS NEEDED and then least often Meloxicam. She gains significant improvement on Tramadol and allows her to function and remain active She has CKD and prefers to limit Meloxicam NSAID - She is on Omega, Resveratrol, CoQ10, Glucosamine-Chondroitin  CHRONIC HTN / CKD-III Lab showed showed Cr 1.08 eGFR 51 Current Meds - Lisinopril 70m daily currently needs refill Reports good compliance, took meds today. Tolerating well, w/o complaints. Interval update, has had Renal [place]- demonstrated resolved hydronephrosis, prior nephrolithiasis s/p resolved. She has not had any further urinary problem. Walking more regularly Denies CP, dyspnea, HA, edema, dizziness / lightheadedness   Breast Cancer Surveillance See prior history Update w/ CA27.29 and lab result is 28 (05/2021), negative result.   Hypothyroidism Has been on Levothyroxine 24m daily low dose She feels better overall on medication she could tell a difference Improved lab results TSH 2.4 and Free T4 1.3       01/02/2022    1:13 PM 06/19/2021   11:00 AM 03/27/2021     1:15 PM  Depression screen PHQ 2/9  Decreased Interest 1 0 0  Down, Depressed, Hopeless 0 0 0  PHQ - 2 Score 1 0 0  Altered sleeping 1 0 0  Tired, decreased energy 1 0 0  Change in appetite 0 0 0  Feeling bad or failure about yourself  0 0 0  Trouble concentrating 0 0 0  Moving slowly or fidgety/restless 0 0 0  Suicidal thoughts 0 0 0  PHQ-9 Score 3 0 0  Difficult doing work/chores Not difficult at all Not difficult at all Not difficult at all    Past Medical History:  Diagnosis Date   Allergy    [place]   left, radiation and lumpectomy   Hypertension    Personal history of radiation therapy    Past Surgical History:  Procedure Laterality Date   ABDOMINAL HYSTERECTOMY     BREAST BIOPSY Left 01/30/2011   negative   BREAST BIOPSY Left 01/30/2012   positive   BREAST CYST ASPIRATION Left    BREAST LUMPECTOMY Left 04/02/2012   positive   COLONOSCOPY WITH PROPOFOL N/A 07/23/2017   Procedure: COLONOSCOPY WITH PROPOFOL WITH BIOPSIES;  Surgeon: AnJonathon BellowsMD;  [facility]  Service: Gastroenterology;  Laterality: N/A;   MOHS SURGERY  02/28/2021   Social History   Socioeconomic History   Marital status: Widowed    Spouse name: Not on file   Number of children: 1   Years of education: 1370 Highest education level: High school graduate  Occupational History  Not on file  Tobacco Use   Smoking status: Never   Smokeless tobacco: Never  Vaping Use   Vaping Use: Never used  Substance and Sexual Activity   Alcohol use: No    Alcohol/week: 0.0 standard drinks of alcohol   Drug use: No   Sexual activity: Never  Other Topics Concern   Not on file  Social History Narrative   Not on file   Social Determinants of Health   Financial Resource Strain: Low Risk  (03/27/2021)   Overall Financial Resource Strain (CARDIA)    Difficulty of Paying Living Expenses: Not hard at all  Food Insecurity: No Food Insecurity (03/27/2021)   Hunger Vital  Sign    Worried About Running Out of Food in the Last Year: Never true    Ran Out of Food in the Last Year: Never true  Transportation Needs: Unmet Transportation Needs (03/27/2021)   PRAPARE - Transportation    Lack of Transportation (Medical): Yes    Lack of Transportation (Non-Medical): Yes  Physical Activity: Insufficiently Active (03/27/2021)   Exercise Vital Sign    Days of Exercise per Week: 7 days    Minutes of Exercise per Session: 20 min  Stress: No Stress Concern Present (03/27/2021)   [name]    Feeling of Stress : Not at all  Social Connections: Socially Isolated (03/27/2021)   Social Connection and Isolation Panel [NHANES]    Frequency of Communication with Friends and Family: More than three times a week    Frequency of Social Gatherings with Friends and Family: More than three times a week    Attends Religious Services: Never    Marine scientist or Organizations: No    Attends Archivist Meetings: Never    Marital Status: Widowed  Intimate Partner Violence: Not At Risk (08/13/2017)   Humiliation, Afraid, Rape, and Kick questionnaire    Fear of Current or Ex-Partner: No    Emotionally Abused: No    Physically Abused: No    Sexually Abused: No   Family History  Problem Relation Age of Onset   Breast cancer Paternal Aunt 36   Osteoporosis Mother    Heart attack Father    Current Outpatient Medications on File Prior to Visit  Medication Sig   atorvastatin (LIPITOR) 10 MG tablet TAKE 1 TABLET(10 MG) BY MOUTH AT BEDTIME   calcium carbonate (OSCAL) 1500 (600 CA) MG TABS tablet Take 1 tablet by mouth every other day.   Calcium Carbonate-Vitamin D 600-200 MG-UNIT TABS Take by mouth.   cholecalciferol (VITAMIN D) 1000 UNITS tablet Take 1,000 Units by mouth daily.   co-enzyme Q-10 50 MG capsule Take 50 mg by mouth daily.   glucosamine-chondroitin 500-400 MG tablet Take 1 tablet by mouth 3  (three) times daily.   levothyroxine (SYNTHROID) 25 MCG tablet Take 1 tablet (25 mcg total) by mouth daily before breakfast.   lisinopril (ZESTRIL) 40 MG tablet TAKE 1 TABLET(40 MG) BY MOUTH DAILY   meloxicam (MOBIC) 15 MG tablet TAKE 1 TABLET BY MOUTH EVERY DAY AS NEEDED FOR PAIN. PREFERRED TO TAKE UP TO 1 TO 2 WEEKS AT A TIME THEN STOP.   Multiple Vitamins-Minerals (CENTRUM SILVER PO) Take 1 tablet by mouth daily.   Omega-3 Krill Oil 300 MG CAPS Take 1 capsule by mouth daily.   Resveratrol 250 MG CAPS    No current facility-administered medications on file prior to visit.    Review of Systems  Per HPI unless specifically indicated above     Objective:    BP 138/70 (BP Location: Right Arm, Cuff Size: Large)   Pulse 90   Ht _0  (1.626 m)   Wt 184 lb (83.5 kg)   SpO2 100%   BMI 31.58 kg/m   Wt Readings from Last 3 Encounters:  01/02/22 184 lb (83.5 kg)  06/19/21 188 lb 12.8 oz (85.6 kg)  12/02/20 183 lb 3.2 oz (83.1 kg)    Physical Exam Vitals and nursing note reviewed.  Constitutional:      General: She is not in acute distress.    Appearance: She is well-developed. She is not diaphoretic.     Comments: Well-appearing, comfortable, cooperative  HENT:     Head: Normocephalic and atraumatic.  Eyes:     General:        Right eye: No discharge.        Left eye: No discharge.     Conjunctiva/sclera: Conjunctivae normal.  Neck:     Thyroid: No thyromegaly.  Cardiovascular:     Rate and Rhythm: Normal rate and regular rhythm.     Heart sounds: Normal heart sounds. No murmur heard. Pulmonary:     Effort: Pulmonary effort is normal. No respiratory distress.     Breath sounds: Normal breath sounds. No wheezing or rales.  Musculoskeletal:        General: Normal range of motion.     Cervical back: Normal range of motion and neck supple.  Lymphadenopathy:     Cervical: No cervical adenopathy.  Skin:    General: Skin is warm and dry.     Findings: No erythema or rash.   Neurological:     Mental Status: She is alert and oriented to person, place, and time.  Psychiatric:        Behavior: Behavior normal.     Comments: Well groomed, good eye contact, normal speech and thoughts    Results for orders placed or performed in visit on 09/13/21  T4, free  Result Value Ref Range   Free T4 1.3 0.8 - 1.8 ng/dL  TSH  Result Value Ref Range   TSH 2.40 0.40 - 4.50 mIU/L      Assessment & Plan:   Problem List Items Addressed This Visit     Acquired hypothyroidism - Primary    Hypothyroidism, significantly improved and controlled TSH and T4 normal range Symptoms improved on low dose Continue Levothyroxine 72mg daily Future reconsider dose inc if indicated Rpt labs 6 months      Benign hypertension with CKD (chronic kidney disease) stage III    Controlled HTN, repeat manual improved, she will get a wrist cuff at home - Home BP readings normal Complication with CKD-III - Cr improved today OFF BB Metoprolol   Plan:  1. Continue Lisinopril 430mdaily 2. Encourage improved lifestyle - low sodium diet, regular exercise 3. Continue monitor BP outside office, bring readings to next visit, if persistently >140/90 or new symptoms notify office sooner 4. F/u 6 months  Reconsider ECHOcardiogram given heart murmur and ultimately we agree to defer for now, she is asymptomatic.      Bilateral chronic knee pain   Relevant Medications   traMADol (ULTRAM) 50 MG tablet   Chronic low back pain without sciatica   Relevant Medications   traMADol (ULTRAM) 50 MG tablet   Liver cyst    Review of prior imaging results initial CT incidental finding 05/2019, had ultrasound then MRI 11/2019 At  this point, appears to not have any high risk or concerning features, radiology suggests that it can be considered to repeat imaging, patient is not interested in repeat imaging, and I would be comfortable in agreeing that she can monitor for any concerns or symptoms       Osteoarthritis, multiple sites    Stable chronic OA/DJD multiple joints Without recent flare No recent imaging, would benefit from x-ray for characterizing arthritis  Continue OTC supplements glucosamine/chondroitin and Resveratrol Omega 3, improved  Tramadol PRN with improved functional quality of life, renew Use Tylenol AS NEEDED, Limit NSAIDs      Relevant Medications   traMADol (ULTRAM) 50 MG tablet    Updated Health Maintenance information Reviewed recent lab results with patient Encouraged improvement to lifestyle with diet and exercise Goal of weight loss   Meds ordered this encounter  Medications   traMADol (ULTRAM) 50 MG tablet    Sig: Take 1 tablet (50 mg total) by mouth every 8 (eight) hours as needed for moderate pain.    Dispense:  30 tablet    Refill:  5    For future refills on file      Follow up plan: Return in about 6 months (around 07/04/2022) for 6 month fasting lab only then 1 week later Annual Medicare Physical.  Future labs CA27.29 CMET Lipid CBC A1c TSH T4 in 6 months  Nobie Putnam, DO [facility] 01/02/2022, 1:26 PM

## 2022-06-03 ENCOUNTER — Other Ambulatory Visit: Payer: Self-pay | Admitting: Family Medicine

## 2022-06-03 DIAGNOSIS — E785 Hyperlipidemia, unspecified: Secondary | ICD-10-CM

## 2022-06-05 ENCOUNTER — Other Ambulatory Visit: Payer: Self-pay | Admitting: Family Medicine

## 2022-06-05 DIAGNOSIS — E785 Hyperlipidemia, unspecified: Secondary | ICD-10-CM

## 2022-06-05 NOTE — Telephone Encounter (Signed)
Requested Prescriptions  Refused Prescriptions Disp Refills   atorvastatin (LIPITOR) 10 MG tablet [Pharmacy Med Name: ATORVASTATIN 10MG  TABLETS] 90 tablet     Sig: TAKE 1 TABLET(10 MG) BY MOUTH AT BEDTIME     Cardiovascular:  Antilipid - Statins Failed - 06/05/2022  7:55 AM      Failed - Lipid Panel in normal range within the last 12 months    Cholesterol  Date Value Ref Range Status  06/16/2021 166 <200 mg/dL Final   LDL Cholesterol (Calc)  Date Value Ref Range Status  06/16/2021 79 mg/dL (calc) Final    Comment:    Reference range: <100 . Desirable range <100 mg/dL for primary prevention;   <70 mg/dL for patients with CHD or diabetic patients  with > or = 2 CHD risk factors. Marland Kitchen LDL-C is now calculated using the Martin-Hopkins  calculation, which is a validated novel method providing  better accuracy than the Friedewald equation in the  estimation of LDL-C.  Horald Pollen et al. Lenox Ahr. 5400;867(61): 2061-2068  (http://education.[facility].com/faq/FAQ164)    HDL  Date Value Ref Range Status  06/16/2021 66 > OR = 50 mg/dL Final   Triglycerides  Date Value Ref Range Status  06/16/2021 116 <150 mg/dL Final         Passed - Patient is not pregnant      Passed - Valid encounter within last 12 months    Recent Outpatient Visits           5 months ago Acquired hypothyroidism   [place] [facility] Smitty Cords, DO   11 months ago Annual physical exam   [place] [facility] Smitty Cords, DO   1 year ago Benign hypertension with CKD (chronic kidney disease) stage III   [place] [facility] [place], Netta Neat, DO   2 years ago Benign hypertension with CKD (chronic kidney disease) stage III   [place] [facility] Smitty Cords, DO   2 years ago Liver cyst   [place] [facility] Smitty Cords, DO       Future Appointments              In 1 month Althea Charon, Netta Neat, DO [facility], [facility]

## 2022-06-05 NOTE — Telephone Encounter (Signed)
Requested Prescriptions  Pending Prescriptions Disp Refills   atorvastatin (LIPITOR) 10 MG tablet [Pharmacy Med Name: ATORVASTATIN 10MG  TABLETS] 30 tablet 0    Sig: TAKE 1 TABLET(10 MG) BY MOUTH AT BEDTIME     Cardiovascular:  Antilipid - Statins Failed - 06/03/2022  2:54 PM      Failed - Lipid Panel in normal range within the last 12 months    Cholesterol  Date Value Ref Range Status  06/16/2021 166 <200 mg/dL Final   LDL Cholesterol (Calc)  Date Value Ref Range Status  06/16/2021 79 mg/dL (calc) Final    Comment:    Reference range: <100 . Desirable range <100 mg/dL for primary prevention;   <70 mg/dL for patients with CHD or diabetic patients  with > or = 2 CHD risk factors. Marland Kitchen LDL-C is now calculated using the Martin-Hopkins  calculation, which is a validated novel method providing  better accuracy than the Friedewald equation in the  estimation of LDL-C.  Horald Pollen et al. Lenox Ahr. 1610;960(45): 2061-2068  (http://education.[facility].com/faq/FAQ164)    HDL  Date Value Ref Range Status  06/16/2021 66 > OR = 50 mg/dL Final   Triglycerides  Date Value Ref Range Status  06/16/2021 116 <150 mg/dL Final         Passed - Patient is not pregnant      Passed - Valid encounter within last 12 months    Recent Outpatient Visits           5 months ago Acquired hypothyroidism   [place] [facility] Smitty Cords, DO   11 months ago Annual physical exam   [place] [facility] Smitty Cords, DO   1 year ago Benign hypertension with CKD (chronic kidney disease) stage III   [place] [facility] [place], Netta Neat, DO   2 years ago Benign hypertension with CKD (chronic kidney disease) stage III   [place] [facility] Smitty Cords, DO   2 years ago Liver cyst   [place] [facility] Smitty Cords, DO       Future Appointments              In 1 month Althea Charon, Netta Neat, DO [facility], [facility]

## 2022-06-12 DIAGNOSIS — H43813 Vitreous degeneration, bilateral: Secondary | ICD-10-CM | POA: Diagnosis not present

## 2022-06-12 DIAGNOSIS — H2513 Age-related nuclear cataract, bilateral: Secondary | ICD-10-CM | POA: Diagnosis not present

## 2022-07-01 ENCOUNTER — Other Ambulatory Visit: Payer: Self-pay | Admitting: Family Medicine

## 2022-07-01 DIAGNOSIS — E785 Hyperlipidemia, unspecified: Secondary | ICD-10-CM

## 2022-07-02 NOTE — Telephone Encounter (Signed)
Requested medication (s) are due for refill today: yes   Requested medication (s) are on the active medication list: yes   Last refill:  06/05/22 #30 0 refills  Future visit scheduled: yes in 1 week   Notes to clinic:   do you want to refill for another courtesy refill or wait until OV ? Last labs 06/16/21     Requested Prescriptions  Pending Prescriptions Disp Refills   atorvastatin (LIPITOR) 10 MG tablet [Pharmacy Med Name: ATORVASTATIN 10MG  TABLETS] 30 tablet 0    Sig: TAKE 1 TABLET(10 MG) BY MOUTH AT BEDTIME     Cardiovascular:  Antilipid - Statins Failed - 07/01/2022  2:34 PM      Failed - Lipid Panel in normal range within the last 12 months    Cholesterol  Date Value Ref Range Status  06/16/2021 166 <200 mg/dL Final   LDL Cholesterol (Calc)  Date Value Ref Range Status  06/16/2021 79 mg/dL (calc) Final    Comment:    Reference range: <100 . Desirable range <100 mg/dL for primary prevention;   <70 mg/dL for patients with CHD or diabetic patients  with > or = 2 CHD risk factors. Marland Kitchen LDL-C is now calculated using the Martin-Hopkins  calculation, which is a validated novel method providing  better accuracy than the Friedewald equation in the  estimation of LDL-C.  Horald Pollen et al. Lenox Ahr. 6045;409(81): 2061-2068  (http://education.[facility].com/faq/FAQ164)    HDL  Date Value Ref Range Status  06/16/2021 66 > OR = 50 mg/dL Final   Triglycerides  Date Value Ref Range Status  06/16/2021 116 <150 mg/dL Final         Passed - Patient is not pregnant      Passed - Valid encounter within last 12 months    Recent Outpatient Visits           6 months ago Acquired hypothyroidism   [place] [facility] Althea Charon, Netta Neat, DO   1 year ago Annual physical exam   [place] [facility] Smitty Cords, DO   1 year ago Benign hypertension with CKD (chronic kidney disease) stage III   [place] [facility], Netta Neat, DO   2 years ago Benign hypertension with CKD (chronic kidney disease) stage III   [place] [facility] Smitty Cords, DO   2 years ago Liver cyst   [place] [facility] Smitty Cords, DO       Future Appointments             In 1 week Althea Charon, Netta Neat, DO [place] [facility], [facility]

## 2022-07-04 ENCOUNTER — Other Ambulatory Visit: Payer: Medicare Other

## 2022-07-04 DIAGNOSIS — E559 Vitamin D deficiency, unspecified: Secondary | ICD-10-CM

## 2022-07-04 DIAGNOSIS — N183 Chronic kidney disease, stage 3 unspecified: Secondary | ICD-10-CM | POA: Diagnosis not present

## 2022-07-04 DIAGNOSIS — E782 Mixed hyperlipidemia: Secondary | ICD-10-CM

## 2022-07-04 DIAGNOSIS — E039 Hypothyroidism, unspecified: Secondary | ICD-10-CM | POA: Diagnosis not present

## 2022-07-04 DIAGNOSIS — R7309 Other abnormal glucose: Secondary | ICD-10-CM | POA: Diagnosis not present

## 2022-07-04 DIAGNOSIS — Z853 Personal history of malignant neoplasm of breast: Secondary | ICD-10-CM | POA: Diagnosis not present

## 2022-07-04 DIAGNOSIS — I129 Hypertensive chronic kidney disease with stage 1 through stage 4 chronic kidney disease, or unspecified chronic kidney disease: Secondary | ICD-10-CM | POA: Diagnosis not present

## 2022-07-04 LAB — CBC WITH DIFFERENTIAL/PLATELET
Basophils Absolute: 12 cells/uL (ref 0–200)
Eosinophils Absolute: 90 cells/uL (ref 15–500)
MCHC: 33.2 g/dL (ref 32.0–36.0)
MCV: 93.2 fL (ref 80.0–100.0)
Neutro Abs: 2480 cells/uL (ref 1500–7800)
Neutrophils Relative %: 63.6 %

## 2022-07-05 LAB — CBC WITH DIFFERENTIAL/PLATELET
Absolute Monocytes: 289 cells/uL (ref 200–950)
Basophils Relative: 0.3 %
Eosinophils Relative: 2.3 %
HCT: 42.5 % (ref 35.0–45.0)
Hemoglobin: 14.1 g/dL (ref 11.7–15.5)
Lymphs Abs: 1030 cells/uL (ref 850–3900)
MCH: 30.9 pg (ref 27.0–33.0)
MPV: 11.2 fL (ref 7.5–12.5)
Monocytes Relative: 7.4 %
Platelets: 152 10*3/uL (ref 140–400)
RBC: 4.56 10*6/uL (ref 3.80–5.10)
RDW: 11.8 % (ref 11.0–15.0)
Total Lymphocyte: 26.4 %
WBC: 3.9 10*3/uL (ref 3.8–10.8)

## 2022-07-05 LAB — COMPLETE METABOLIC PANEL WITH GFR
AG Ratio: 2.3 (calc) (ref 1.0–2.5)
ALT: 10 U/L (ref 6–29)
AST: 16 U/L (ref 10–35)
Albumin: 4.3 g/dL (ref 3.6–5.1)
Alkaline phosphatase (APISO): 73 U/L (ref 37–153)
BUN/Creatinine Ratio: 17 (calc) (ref 6–22)
BUN: 16 mg/dL (ref 7–25)
CO2: 22 mmol/L (ref 20–32)
Calcium: 10.6 mg/dL — ABNORMAL HIGH (ref 8.6–10.4)
Chloride: 108 mmol/L (ref 98–110)
Creat: 0.96 mg/dL — ABNORMAL HIGH (ref 0.60–0.95)
Globulin: 1.9 g/dL (calc) (ref 1.9–3.7)
Glucose, Bld: 95 mg/dL (ref 65–99)
Potassium: 4.3 mmol/L (ref 3.5–5.3)
Sodium: 140 mmol/L (ref 135–146)
Total Bilirubin: 0.9 mg/dL (ref 0.2–1.2)
Total Protein: 6.2 g/dL (ref 6.1–8.1)
eGFR: 58 mL/min/{1.73_m2} — ABNORMAL LOW (ref >=60)

## 2022-07-05 LAB — HEMOGLOBIN A1C
Hgb A1c MFr Bld: 5.6 % of total Hgb (ref <5.7)
Mean Plasma Glucose: 114 mg/dL
eAG (mmol/L): 6.3 mmol/L

## 2022-07-05 LAB — LIPID PANEL
Cholesterol: 150 mg/dL (ref <200)
HDL: 67 mg/dL (ref >=50)
LDL Cholesterol (Calc): 64 mg/dL (calc)
Non-HDL Cholesterol (Calc): 83 mg/dL (calc) (ref <130)
Total CHOL/HDL Ratio: 2.2 (calc) (ref <5.0)
Triglycerides: 103 mg/dL (ref <150)

## 2022-07-05 LAB — TSH: TSH: 2.01 mIU/L (ref 0.40–4.50)

## 2022-07-05 LAB — T4, FREE: Free T4: 1.4 ng/dL (ref 0.8–1.8)

## 2022-07-05 LAB — CANCER ANTIGEN 27.29: CA 27.29: 36 U/mL (ref <38)

## 2022-07-05 LAB — VITAMIN D 25 HYDROXY (VIT D DEFICIENCY, FRACTURES): Vit D, 25-Hydroxy: 45 ng/mL (ref 30–100)

## 2022-07-11 ENCOUNTER — Encounter: Payer: Medicare Other | Admitting: Family Medicine

## 2022-07-11 DIAGNOSIS — D225 Melanocytic nevi of trunk: Secondary | ICD-10-CM | POA: Diagnosis not present

## 2022-07-11 DIAGNOSIS — D2262 Melanocytic nevi of left upper limb, including shoulder: Secondary | ICD-10-CM | POA: Diagnosis not present

## 2022-07-11 DIAGNOSIS — L658 Other specified nonscarring hair loss: Secondary | ICD-10-CM | POA: Diagnosis not present

## 2022-07-11 DIAGNOSIS — L821 Other seborrheic keratosis: Secondary | ICD-10-CM | POA: Diagnosis not present

## 2022-07-11 DIAGNOSIS — D2261 Melanocytic nevi of right upper limb, including shoulder: Secondary | ICD-10-CM | POA: Diagnosis not present

## 2022-07-11 DIAGNOSIS — L718 Other rosacea: Secondary | ICD-10-CM | POA: Diagnosis not present

## 2022-07-11 DIAGNOSIS — D2272 Melanocytic nevi of left lower limb, including hip: Secondary | ICD-10-CM | POA: Diagnosis not present

## 2022-07-11 DIAGNOSIS — D2271 Melanocytic nevi of right lower limb, including hip: Secondary | ICD-10-CM | POA: Diagnosis not present

## 2022-07-12 ENCOUNTER — Ambulatory Visit (INDEPENDENT_AMBULATORY_CARE_PROVIDER_SITE_OTHER): Payer: Medicare Other | Admitting: Family Medicine

## 2022-07-12 ENCOUNTER — Encounter: Payer: Self-pay | Admitting: Family Medicine

## 2022-07-12 VITALS — BP 144/70 | HR 90 | Temp 98.9°F | Resp 18 | Ht 64.0 in | Wt 185.0 lb

## 2022-07-12 DIAGNOSIS — E039 Hypothyroidism, unspecified: Secondary | ICD-10-CM

## 2022-07-12 DIAGNOSIS — E782 Mixed hyperlipidemia: Secondary | ICD-10-CM

## 2022-07-12 DIAGNOSIS — N183 Chronic kidney disease, stage 3 unspecified: Secondary | ICD-10-CM | POA: Diagnosis not present

## 2022-07-12 DIAGNOSIS — L658 Other specified nonscarring hair loss: Secondary | ICD-10-CM

## 2022-07-12 DIAGNOSIS — R7309 Other abnormal glucose: Secondary | ICD-10-CM | POA: Diagnosis not present

## 2022-07-12 DIAGNOSIS — Z853 Personal history of malignant neoplasm of breast: Secondary | ICD-10-CM | POA: Diagnosis not present

## 2022-07-12 DIAGNOSIS — E559 Vitamin D deficiency, unspecified: Secondary | ICD-10-CM

## 2022-07-12 DIAGNOSIS — M159 Polyosteoarthritis, unspecified: Secondary | ICD-10-CM | POA: Diagnosis not present

## 2022-07-12 DIAGNOSIS — I700 Atherosclerosis of aorta: Secondary | ICD-10-CM | POA: Insufficient documentation

## 2022-07-12 DIAGNOSIS — I129 Hypertensive chronic kidney disease with stage 1 through stage 4 chronic kidney disease, or unspecified chronic kidney disease: Secondary | ICD-10-CM

## 2022-07-12 NOTE — Assessment & Plan Note (Addendum)
Stable chronic OA/DJD multiple joints Without recent flare No recent imaging, would benefit from x-ray for characterizing arthritis  Continue OTC supplements glucosamine/chondroitin and Resveratrol Omega 3, improved  Tramadol AS NEEDED, has refills Use Tylenol AS NEEDED, Limit NSAIDs

## 2022-07-12 NOTE — Assessment & Plan Note (Signed)
Controlled cholesterol on statin and lifestyle  Plan: 1. Continue current meds - Atorvastatin 10mg daily 2. Continue ASA 81mg for primary ASCVD risk reduction 3. Encourage improved lifestyle - low carb/cholesterol, reduce portion size, continue improving regular exercise 4. Follow-up yearly lipids 

## 2022-07-12 NOTE — Assessment & Plan Note (Signed)
Elevated BP but repeat improved Her cuff calibrated seems to be accurate but one false reading too high Complication with CKD-III OFF BB Metoprolol   Plan:  1. Continue Lisinopril 40mg  daily. No change today. We considered ARB vs other agent. - She will start Minoxidil for hair loss. This may lower BP as well. 2. Encourage improved lifestyle - low sodium diet, regular exercise 3. Continue monitor BP outside office, bring readings to next visit, if persistently >140/90 or new symptoms notify office sooner 4. F/u 6 months  Reconsider ECHOcardiogram given heart murmur and ultimately we agree to defer for now, she is asymptomatic.

## 2022-07-12 NOTE — Assessment & Plan Note (Signed)
Identified on imaging previously CT On Atorvastatin

## 2022-07-12 NOTE — Assessment & Plan Note (Signed)
A1c is slightly higher with avg blood sugar, this is okay again you do not need to change much. Caution with too much carb sweet starch sugar, but okay to still enjoy. No medicines required.

## 2022-07-12 NOTE — Assessment & Plan Note (Signed)
Stable, without recurrence See HPI / overview for background on breast cancer Left sided, s/p partial mastectomy after biopsy excision, radiation, 1 yr hormonal therapy Arimidex  Last lab CA27.29 negative but had slight elevated trend but still in normal range.  She would like to defer or decline mammogram and Oncology follow-up

## 2022-07-12 NOTE — Assessment & Plan Note (Signed)
Hypothyroidism, significantly improved and controlled TSH and T4 normal range Symptoms improved on low dose Continue Levothyroxine daily

## 2022-07-12 NOTE — Progress Notes (Signed)
Subjective:    Patient ID: Angela Bullock, female    DOB: 1935-07-29, 87 y.o.   MRN: [id number]  Angela Bullock is a 87 y.o. female presenting on 07/12/2022 for Annual Exam   HPI  Follow-up liver cyst ultrasound Recent results showed normal liver tissue on [place], no sign of cirrhosis or other liver disease, had . 3.6 x 2.4 x 3.5 cm hypoechoic lesion in the anterior aspect of the left lobe of the liver on [place]. They recommended MRI. - She is doing well asymptomatic Last imaging update MRI 11/2019 see below, unremarkable, stable Last imaging [place] after the MRI, stable, future reconsider MRI   Chronic Pain Multiple Joints / Osteoarthritis Multiple joints (knees, back) Currently doing well, she admits if active physically will get some pain and symptoms from her back and other joints. She takes something every day. Tylenol often, and then will take Tramadol AS NEEDED and then least often Meloxicam. She gains significant improvement on Tramadol and allows her to function and remain active She has CKD and prefers to limit Meloxicam NSAID - She is on Omega, Resveratrol, CoQ10, Glucosamine-Chondroitin  Taking Tylenol Arthritis and Tylenol PM at night with relief, less waking up Taking Tramadol AS NEEDED, not every day    CHRONIC HTN / CKD-III Lab showed showed Cr 0.96 eGFR 58 Current Meds - Lisinopril 40mg  daily currently Reports good compliance, took meds today. Tolerating well, w/o complaints. Interval update, has had Renal US - demonstrated resolved hydronephrosis, prior nephrolithiasis s/p resolved. She has not had any further urinary problem. Walking more regularly Denies CP, dyspnea, HA, edema, dizziness / lightheadedness  She has her home wrist BP Cuff today and it had an abnormal reading x 1 but then normal. 127/75 repeat reading wrist cuff  Hair Loss, chronic She has tried topical minoxidil formulation topical for years. Mixed results. She will now trial low dose Minoxidil for hair  loss, start half dose. May lower BP   Breast Cancer Surveillance See prior history Update w/ CA27.29 and lab result is 36 (2024), negative result.   Hypothyroidism Has been on Levothyroxine daily low dose Normalized labs TSH T4 She feels better overall on medication she could tell a difference      07/12/2022   11:26 AM 01/02/2022    1:13 PM 06/19/2021   11:00 AM  Depression screen PHQ 2/9  Decreased Interest 0 1 0  Down, Depressed, Hopeless 1 0 0  PHQ - 2 Score 1 1 0  Altered sleeping 1 1 0  Tired, decreased energy 1 1 0  Change in appetite 0 0 0  Feeling bad or failure about yourself  1 0 0  Trouble concentrating 0 0 0  Moving slowly or fidgety/restless 0 0 0  Suicidal thoughts 0 0 0  PHQ-9 Score 4 3 0  Difficult doing work/chores Not difficult at all Not difficult at all Not difficult at all      07/12/2022   11:27 AM 01/02/2022    1:14 PM 06/19/2021   11:00 AM  GAD 7 : Generalized Anxiety Score  Nervous, Anxious, on Edge 0 0 0  Control/stop worrying 0 0 0  Worry too much - different things 0 0 0  Trouble relaxing 0 0 0  Restless 0 0 0  Easily annoyed or irritable 1 1 0  Afraid - awful might happen 0 0 0  Total GAD 7 Score 1 1 0  Anxiety Difficulty Not difficult at all Not difficult at all  Not difficult at all      Past Medical History:  Diagnosis Date   Allergy    Breast cancer (HCC) 01/30/2012   left, radiation and lumpectomy   Hypertension    Personal history of radiation therapy    Past Surgical History:  Procedure Laterality Date   ABDOMINAL HYSTERECTOMY     BREAST BIOPSY Left 01/30/2011   negative   BREAST BIOPSY Left 01/30/2012   positive   BREAST CYST ASPIRATION Left    BREAST LUMPECTOMY Left 04/02/2012   positive   COLONOSCOPY WITH PROPOFOL N/A 07/23/2017   Procedure: COLONOSCOPY WITH PROPOFOL WITH BIOPSIES;  Surgeon: Wyline Mood, MD;  Location[facility] ENDOSCOPY;  Service: Gastroenterology;  Laterality: N/A;   MOHS SURGERY  02/28/2021    Social History   Socioeconomic History   Marital status: Widowed    Spouse name: Not on file   Number of children: 1   Years of education: 37   Highest education level: High school graduate  Occupational History   Not on file  Tobacco Use   Smoking status: Never   Smokeless tobacco: Never  Vaping Use   Vaping Use: Never used  Substance and Sexual Activity   Alcohol use: No    Alcohol/week: 0.0 standard drinks of alcohol   Drug use: No   Sexual activity: Never  Other Topics Concern   Not on file  Social History Narrative   Not on file   Social Determinants of Health   Financial Resource Strain: Low Risk  (03/27/2021)   Overall Financial Resource Strain (CARDIA)    Difficulty of Paying Living Expenses: Not hard at all  Food Insecurity: No Food Insecurity (03/27/2021)   Hunger Vital Sign    Worried About Running Out of Food in the Last Year: Never true    Ran Out of Food in the Last Year: Never true  Transportation Needs: Unmet Transportation Needs (03/27/2021)   PRAPARE - Transportation    Lack of Transportation (Medical): Yes    Lack of Transportation (Non-Medical): Yes  Physical Activity: Insufficiently Active (03/27/2021)   Exercise Vital Sign    Days of Exercise per Week: 7 days    Minutes of Exercise per Session: 20 min  Stress: No Stress Concern Present (03/27/2021)   [employer] of Occupational Health - Occupational Stress Questionnaire    Feeling of Stress : Not at all  Social Connections: Socially Isolated (03/27/2021)   Social Connection and Isolation Panel [NHANES]    Frequency of Communication with Friends and Family: More than three times a week    Frequency of Social Gatherings with Friends and Family: More than three times a week    Attends Religious Services: Never    Database administrator or Organizations: No    Attends Banker Meetings: Never    Marital Status: Widowed  Intimate Partner Violence: Not At Risk (08/13/2017)    Humiliation, Afraid, Rape, and Kick questionnaire    Fear of Current or Ex-Partner: No    Emotionally Abused: No    Physically Abused: No    Sexually Abused: No   Family History  Problem Relation Age of Onset   Breast cancer Paternal Aunt 17   Osteoporosis Mother    Heart attack Father    Current Outpatient Medications on File Prior to Visit  Medication Sig   atorvastatin (LIPITOR) 10 MG tablet TAKE 1 TABLET(10 MG) BY MOUTH AT BEDTIME   calcium carbonate (OSCAL) 1500 (600 CA) MG TABS tablet Take 1  tablet by mouth every other day.   Calcium Carbonate-Vitamin D 600-200 MG-UNIT TABS Take by mouth.   cholecalciferol (VITAMIN D) 1000 UNITS tablet Take 1,000 Units by mouth daily.   co-enzyme Q-10 50 MG capsule Take 50 mg by mouth daily.   levothyroxine (SYNTHROID) 25 MCG tablet Take 1 tablet (25 mcg total) by mouth daily before breakfast.   lisinopril (ZESTRIL) 40 MG tablet TAKE 1 TABLET(40 MG) BY MOUTH DAILY   meloxicam (MOBIC) 15 MG tablet TAKE 1 TABLET BY MOUTH EVERY DAY AS NEEDED FOR PAIN. PREFERRED TO TAKE UP TO 1 TO 2 WEEKS AT A TIME THEN STOP.   Multiple Vitamins-Minerals (CENTRUM SILVER PO) Take 1 tablet by mouth daily.   Omega-3 Krill Oil 300 MG CAPS Take 1 capsule by mouth daily.   Resveratrol 250 MG CAPS    traMADol (ULTRAM) 50 MG tablet Take 1 tablet (50 mg total) by mouth every 8 (eight) hours as needed for moderate pain.   No current facility-administered medications on file prior to visit.    Review of Systems Per HPI unless specifically indicated above      Objective:    BP (!) 144/70 (BP Location: Left Arm, Patient Position: Sitting, Cuff Size: Large)   Pulse 90   Temp 98.9 F (37.2 C) (Oral)   Resp 18   Ht 5\' 4"  (1.626 m)   Wt 185 lb (83.9 kg)   SpO2 97%   BMI 31.76 kg/m   Wt Readings from Last 3 Encounters:  07/12/22 185 lb (83.9 kg)  01/02/22 184 lb (83.5 kg)  06/19/21 188 lb 12.8 oz (85.6 kg)    Physical Exam Vitals and nursing note reviewed.   Constitutional:      General: She is not in acute distress.    Appearance: She is well-developed. She is not diaphoretic.     Comments: Well-appearing, comfortable, cooperative  HENT:     Head: Normocephalic and atraumatic.  Eyes:     General:        Right eye: No discharge.        Left eye: No discharge.     Conjunctiva/sclera: Conjunctivae normal.     Pupils: Pupils are equal, round, and reactive to light.  Neck:     Thyroid: No thyromegaly.     Vascular: Carotid bruit (bilateral carotid bruit is from radiation from heart murmur) present.  Cardiovascular:     Rate and Rhythm: Normal rate and regular rhythm.     Pulses: Normal pulses.     Heart sounds: Murmur (systolic 3 out of 6 murmur) heard.  Pulmonary:     Effort: Pulmonary effort is normal. No respiratory distress.     Breath sounds: Normal breath sounds. No wheezing or rales.  Abdominal:     General: Bowel sounds are normal. There is no distension.     Palpations: Abdomen is soft. There is no mass.     Tenderness: There is no abdominal tenderness.  Musculoskeletal:        General: No tenderness. Normal range of motion.     Cervical back: Normal range of motion and neck supple.     Right lower leg: Edema present.     Left lower leg: Edema present.     Comments: Upper / Lower Extremities: - Normal muscle tone, strength bilateral upper extremities 5/5, lower extremities 5/5  Lymphadenopathy:     Cervical: No cervical adenopathy.  Skin:    General: Skin is warm and dry.     Findings:  No erythema or rash.  Neurological:     Mental Status: She is alert and oriented to person, place, and time.     Comments: Distal sensation intact to light touch all extremities  Psychiatric:        Mood and Affect: Mood normal.        Behavior: Behavior normal.        Thought Content: Thought content normal.     Comments: Well groomed, good eye contact, normal speech and thoughts     Results for orders placed or performed in visit  on 07/04/22  Cancer antigen 27.29  Result Value Ref Range   CA 27.29 36 <38 U/mL  VITAMIN D 25 Hydroxy (Vit-D Deficiency, Fractures)  Result Value Ref Range   Vit D, 25-Hydroxy 45 30 - 100 ng/mL  T4, free  Result Value Ref Range   Free T4 1.4 0.8 - 1.8 ng/dL  TSH  Result Value Ref Range   TSH 2.01 0.40 - 4.50 mIU/L  Hemoglobin A1c  Result Value Ref Range   Hgb A1c MFr Bld 5.6 <5.7 % of total Hgb   Mean Plasma Glucose 114 mg/dL   eAG (mmol/L) 6.3 mmol/L  Lipid panel  Result Value Ref Range   Cholesterol 150 <200 mg/dL   HDL 67 > OR = 50 mg/dL   Triglycerides 161 <096 mg/dL   LDL Cholesterol (Calc) 64 mg/dL (calc)   Total CHOL/HDL Ratio 2.2 <5.0 (calc)   Non-HDL Cholesterol (Calc) 83 <045 mg/dL (calc)  CBC with Differential/Platelet  Result Value Ref Range   WBC 3.9 3.8 - 10.8 Thousand/uL   RBC 4.56 3.80 - 5.10 Million/uL   Hemoglobin 14.1 11.7 - 15.5 g/dL   HCT 40.9 81.1 - 91.4 %   MCV 93.2 80.0 - 100.0 fL   MCH 30.9 27.0 - 33.0 pg   MCHC 33.2 32.0 - 36.0 g/dL   RDW 78.2 95.6 - 21.3 %   Platelets 152 140 - 400 Thousand/uL   MPV 11.2 7.5 - 12.5 fL   Neutro Abs 2,480 1,500 - 7,800 cells/uL   Lymphs Abs 1,030 850 - 3,900 cells/uL   Absolute Monocytes 289 200 - 950 cells/uL   Eosinophils Absolute 90 15 - 500 cells/uL   Basophils Absolute 12 0 - 200 cells/uL   Neutrophils Relative % 63.6 %   Total Lymphocyte 26.4 %   Monocytes Relative 7.4 %   Eosinophils Relative 2.3 %   Basophils Relative 0.3 %  COMPLETE METABOLIC PANEL WITH GFR  Result Value Ref Range   Glucose, Bld 95 65 - 99 mg/dL   BUN 16 7 - 25 mg/dL   Creat 0.86 (H) 5.78 - 0.95 mg/dL   eGFR 58 (L) > OR = 60 mL/min/1.38m2   BUN/Creatinine Ratio 17 6 - 22 (calc)   Sodium 140 135 - 146 mmol/L   Potassium 4.3 3.5 - 5.3 mmol/L   Chloride 108 98 - 110 mmol/L   CO2 22 20 - 32 mmol/L   Calcium 10.6 (H) 8.6 - 10.4 mg/dL   Total Protein 6.2 6.1 - 8.1 g/dL   Albumin 4.3 3.6 - 5.1 g/dL   Globulin 1.9 1.9 - 3.7 g/dL  (calc)   AG Ratio 2.3 1.0 - 2.5 (calc)   Total Bilirubin 0.9 0.2 - 1.2 mg/dL   Alkaline phosphatase (APISO) 73 37 - 153 U/L   AST 16 10 - 35 U/L   ALT 10 6 - 29 U/L      Assessment & Plan:  Problem List Items Addressed This Visit     Acquired hypothyroidism    Hypothyroidism, significantly improved and controlled TSH and T4 normal range Symptoms improved on low dose Continue Levothyroxine daily      Atherosclerosis of aorta (HCC)    Identified on imaging previously CT On Atorvastatin      Benign hypertension with CKD (chronic kidney disease) stage III - Primary    Elevated BP but repeat improved Her cuff calibrated seems to be accurate but one false reading too high Complication with CKD-III OFF BB Metoprolol   Plan:  1. Continue Lisinopril 40mg  daily. No change today. We considered ARB vs other agent. - She will start Minoxidil for hair loss. This may lower BP as well. 2. Encourage improved lifestyle - low sodium diet, regular exercise 3. Continue monitor BP outside office, bring readings to next visit, if persistently >140/90 or new symptoms notify office sooner 4. F/u 6 months  Reconsider ECHOcardiogram given heart murmur and ultimately we agree to defer for now, she is asymptomatic.      Elevated hemoglobin A1c    A1c is slightly higher with avg blood sugar, this is okay again you do not need to change much. Caution with too much carb sweet starch sugar, but okay to still enjoy. No medicines required.      History of breast cancer    Stable, without recurrence See HPI / overview for background on breast cancer Left sided, s/p partial mastectomy after biopsy excision, radiation, 1 yr hormonal therapy Arimidex  Last lab CA27.29 negative but had slight elevated trend but still in normal range.  She would like to defer or decline mammogram and Oncology follow-up      Hyperlipidemia    Controlled cholesterol on statin and lifestyle  Plan: 1. Continue  current meds - Atorvastatin 10mg  daily 2. Continue ASA 81mg  for primary ASCVD risk reduction 3. Encourage improved lifestyle - low carb/cholesterol, reduce portion size, continue improving regular exercise 4. Follow-up yearly lipids      Osteoarthritis, multiple sites    Stable chronic OA/DJD multiple joints Without recent flare No recent imaging, would benefit from x-ray for characterizing arthritis  Continue OTC supplements glucosamine/chondroitin and Resveratrol Omega 3, improved  Tramadol AS NEEDED, has refills Use Tylenol AS NEEDED, Limit NSAIDs      Vitamin D deficiency   Other Visit Diagnoses     Female pattern hair loss           Updated Health Maintenance information Reviewed recent lab results with patient Encouraged improvement to lifestyle with diet and exercise Goal of weight loss  Slight elevated Calcium okay to scale back supplement.     No orders of the defined types were placed in this encounter.     Follow up plan: Return in about 6 months (around 01/11/2023) for 6 month follow-up HTN Edema Arthritis.  Saralyn Pilar, DO [facility] 07/12/2022, 11:39 AM

## 2022-07-12 NOTE — Patient Instructions (Addendum)
Thank you for coming to the office today.  Lab results look excellent overall  Slight elevated Calcium okay to scale back supplement.  Thyroid is controlled. Keep on same dose  Cholesterol is controlled. Keep on med.  Recent Labs    07/04/22 1132  HGBA1C 5.6   A1c is slightly higher with avg blood sugar, this is okay again you do not need to change much. Caution with too much carb sweet starch sugar, but okay to still enjoy. No medicines required.  Okay with BP since the repeat improved. Yes minoxidil could lower BP more.   Please schedule a Follow-up Appointment to: Return in about 6 months (around 01/11/2023) for 6 month follow-up HTN Edema Arthritis.  If you have any other questions or concerns, please feel free to call the office or send a message through MyChart. You may also schedule an earlier appointment if necessary.  Additionally, you may be receiving a survey about your experience at our office within a few days to 1 week by e-mail or mail. We value your feedback.  Saralyn Pilar, DO [facility], [place]

## 2022-08-19 ENCOUNTER — Other Ambulatory Visit: Payer: Self-pay | Admitting: Family Medicine

## 2022-08-19 DIAGNOSIS — G8929 Other chronic pain: Secondary | ICD-10-CM

## 2022-08-19 DIAGNOSIS — M159 Polyosteoarthritis, unspecified: Secondary | ICD-10-CM

## 2022-08-21 NOTE — Telephone Encounter (Signed)
Requested medication (s) are due for refill today: yes  Requested medication (s) are on the active medication list: yes  Last refill:  01/02/22  Future visit scheduled: yes  Notes to clinic:  Unable to refill per protocol, cannot delegate.      Requested Prescriptions  Pending Prescriptions Disp Refills   traMADol (ULTRAM) 50 MG tablet [Pharmacy Med Name: TRAMADOL 50MG  TABLETS] 30 tablet     Sig: TAKE 1 TABLET(50 MG) BY MOUTH EVERY 8 HOURS AS NEEDED FOR MODERATE PAIN     Not Delegated - Analgesics:  Opioid Agonists Failed - 08/19/2022  2:41 PM      Failed - This refill cannot be delegated      Failed - Urine Drug Screen completed in last 360 days      Passed - Valid encounter within last 3 months    Recent Outpatient Visits           1 month ago Benign hypertension with CKD (chronic kidney disease) stage III   [place] [facility] [place], Netta Neat, DO   7 months ago Acquired hypothyroidism   [place] [facility] Smitty Cords, DO   1 year ago Annual physical exam   [place] [facility] Smitty Cords, DO   1 year ago Benign hypertension with CKD (chronic kidney disease) stage III   [place] [facility] [place], Netta Neat, DO   2 years ago Benign hypertension with CKD (chronic kidney disease) stage III   [place] [facility] [place] [place], Netta Neat, DO       Future Appointments             In 4 months Althea Charon, Netta Neat, DO [place] [facility], [facility]

## 2022-09-03 ENCOUNTER — Other Ambulatory Visit: Payer: Self-pay | Admitting: Family Medicine

## 2022-09-03 DIAGNOSIS — E039 Hypothyroidism, unspecified: Secondary | ICD-10-CM

## 2022-09-04 NOTE — Telephone Encounter (Signed)
Requested Prescriptions  Pending Prescriptions Disp Refills   levothyroxine (SYNTHROID) 25 MCG tablet [Pharmacy Med Name: LEVOTHYROXINE 0.025MG  ( ) TAB] 90 tablet 3    Sig: TAKE 1 TABLET(25 MCG) BY MOUTH DAILY BEFORE BREAKFAST     Endocrinology:  Hypothyroid Agents Passed - 09/03/2022  7:08 AM      Passed - TSH in normal range and within 360 days    TSH  Date Value Ref Range Status  07/04/2022 2.01 0.40 - 4.50 mIU/L Final         Passed - Valid encounter within last 12 months    Recent Outpatient Visits           1 month ago Benign hypertension with CKD (chronic kidney disease) stage III   [place] [facility] [place], Netta Neat, DO   8 months ago Acquired hypothyroidism   [place] [facility] Smitty Cords, DO   1 year ago Annual physical exam   [place] [facility] Smitty Cords, DO   1 year ago Benign hypertension with CKD (chronic kidney disease) stage III   [place] [facility] [place], Netta Neat, DO   2 years ago Benign hypertension with CKD (chronic kidney disease) stage III   [place] [facility] [place], Netta Neat, DO       Future Appointments             In 4 months Althea Charon, Netta Neat, DO [place] [facility], [facility]

## 2022-09-13 ENCOUNTER — Other Ambulatory Visit: Payer: Self-pay | Admitting: Family Medicine

## 2022-09-13 DIAGNOSIS — E039 Hypothyroidism, unspecified: Secondary | ICD-10-CM

## 2022-09-14 ENCOUNTER — Telehealth: Payer: Self-pay | Admitting: Family Medicine

## 2022-09-14 ENCOUNTER — Ambulatory Visit: Payer: Medicare Other

## 2022-09-14 ENCOUNTER — Other Ambulatory Visit: Payer: Self-pay | Admitting: Family Medicine

## 2022-09-14 VITALS — Ht 64.0 in | Wt 180.0 lb

## 2022-09-14 DIAGNOSIS — Z0000 Encounter for general adult medical examination without abnormal findings: Secondary | ICD-10-CM

## 2022-09-14 DIAGNOSIS — E039 Hypothyroidism, unspecified: Secondary | ICD-10-CM

## 2022-09-14 NOTE — Progress Notes (Signed)
Subjective:   Angela Bullock is a 87 y.o. female who presents for Medicare Annual (Subsequent) preventive examination.  Visit Complete: Virtual  I connected with  Dhamar E Wands on 09/14/22 by a audio enabled telemedicine application and verified that I am speaking with the correct person using two identifiers.  Patient Location: Home  Provider Location: Home Office  I discussed the limitations of evaluation and management by telemedicine. The patient expressed understanding and agreed to proceed.   .  Review of Systems    Vital Signs: Unable to obtain new vitals due to this being a telehealth visit.  Cardiac Risk Factors include: advanced age (>62men, >51 women);hypertension     Objective:    Today's Vitals   09/14/22 1305  Weight: 180 lb (81.6 kg)  Height: 5\' 4"  (1.626 m)   Body mass index is 30.9 kg/m.     09/14/2022    1:13 PM 12/08/2019   10:48 AM 06/21/2019    6:46 PM 11/18/2018    9:46 AM 09/29/2018   11:58 AM 12/02/2017    2:55 PM 08/13/2017    1:41 PM  Advanced Directives  Does Patient Have a Medical Advance Directive? Yes Yes No Yes No No Yes  Type of Estate agent of [place];Living will Healthcare Power of Attorney  Living will;Healthcare Power of [employer] Power of [place];Living will  Copy of Healthcare Power of Attorney in Chart? No - copy requested No - copy requested  No - copy requested   No - copy requested  Would patient like information on creating a medical advance directive?   No - Patient declined  No - Patient declined No - Patient declined     Current Medications (verified) Outpatient Encounter Medications as of 09/14/2022  Medication Sig   atorvastatin (LIPITOR) 10 MG tablet TAKE 1 TABLET(10 MG) BY MOUTH AT BEDTIME   calcium carbonate (OSCAL) 1500 (600 CA) MG TABS tablet Take 1 tablet by mouth every other day.   Calcium Carbonate-Vitamin D 600-200 MG-UNIT TABS Take by mouth.   cholecalciferol (VITAMIN D) 1000  UNITS tablet Take 1,000 Units by mouth daily.   co-enzyme Q-10 50 MG capsule Take 50 mg by mouth daily.   levothyroxine (SYNTHROID) 25 MCG tablet TAKE 1 TABLET(25 MCG) BY MOUTH DAILY BEFORE BREAKFAST   lisinopril (ZESTRIL) 40 MG tablet TAKE 1 TABLET(40 MG) BY MOUTH DAILY   meloxicam (MOBIC) 15 MG tablet TAKE 1 TABLET BY MOUTH EVERY DAY AS NEEDED FOR PAIN. PREFERRED TO TAKE UP TO 1 TO 2 WEEKS AT A TIME THEN STOP.   Multiple Vitamins-Minerals (CENTRUM SILVER PO) Take 1 tablet by mouth daily.   Omega-3 Krill Oil 300 MG CAPS Take 1 capsule by mouth daily.   Resveratrol 250 MG CAPS    traMADol (ULTRAM) 50 MG tablet Take 1 tablet (50 mg total) by mouth every 8 (eight) hours as needed.   No facility-administered encounter medications on file as of 09/14/2022.    Allergies (verified) Penicillins   History: Past Medical History:  Diagnosis Date   Allergy    Breast cancer (HCC) 01/30/2012   left, radiation and lumpectomy   Hypertension    Personal history of radiation therapy    Past Surgical History:  Procedure Laterality Date   ABDOMINAL HYSTERECTOMY     BREAST BIOPSY Left 01/30/2011   negative   BREAST BIOPSY Left 01/30/2012   positive   BREAST CYST ASPIRATION Left    BREAST LUMPECTOMY Left 04/02/2012  positive   COLONOSCOPY WITH PROPOFOL N/A 07/23/2017   Procedure: COLONOSCOPY WITH PROPOFOL WITH BIOPSIES;  Surgeon: Wyline Mood, MD;  [facility] ENDOSCOPY;  Service: Gastroenterology;  Laterality: N/A;   MOHS SURGERY  02/28/2021   Family History  Problem Relation Age of Onset   Breast cancer Paternal Aunt 80   Osteoporosis Mother    Heart attack Father    Social History   Socioeconomic History   Marital status: Widowed    Spouse name: Not on file   Number of children: 1   Years of education: 2   Highest education level: High school graduate  Occupational History   Not on file  Tobacco Use   Smoking status: Never   Smokeless tobacco: Never  Vaping Use   Vaping  status: Never Used  Substance and Sexual Activity   Alcohol use: No    Alcohol/week: 0.0 standard drinks of alcohol   Drug use: No   Sexual activity: Never  Other Topics Concern   Not on file  Social History Narrative   Not on file   Social Determinants of Health   Financial Resource Strain: Low Risk  (09/14/2022)   Overall Financial Resource Strain (CARDIA)    Difficulty of Paying Living Expenses: Not hard at all  Food Insecurity: No Food Insecurity (09/14/2022)   Hunger Vital Sign    Worried About Running Out of Food in the Last Year: Never true    Ran Out of Food in the Last Year: Never true  Transportation Needs: No Transportation Needs (09/14/2022)   PRAPARE - Administrator, Civil Service (Medical): No    Lack of Transportation (Non-Medical): No  Physical Activity: Insufficiently Active (09/14/2022)   Exercise Vital Sign    Days of Exercise per Week: 6 days    Minutes of Exercise per Session: 10 min  Stress: No Stress Concern Present (09/14/2022)   [employer] of Occupational Health - Occupational Stress Questionnaire    Feeling of Stress : Not at all  Social Connections: Socially Isolated (09/14/2022)   Social Connection and Isolation Panel [NHANES]    Frequency of Communication with Friends and Family: More than three times a week    Frequency of Social Gatherings with Friends and Family: More than three times a week    Attends Religious Services: Never    Database administrator or Organizations: No    Attends Banker Meetings: Never    Marital Status: Widowed    Tobacco Counseling Counseling given: Not Answered   Clinical Intake:  Pre-visit preparation completed: Yes  Pain : No/denies pain     BMI - recorded: 30.9 Nutritional Status: BMI > 30  Obese Nutritional Risks: None Diabetes: No  How often do you need to have someone help you when you read instructions, pamphlets, or other written materials from your doctor or  pharmacy?: 1 - Never  Interpreter Needed?: No  Information entered by :: Theresa Mulligan LPN   Activities of Daily Living    09/14/2022    1:12 PM 07/12/2022   11:28 AM  In your present state of health, do you have any difficulty performing the following activities:  Hearing? 0 0  Vision? 0 0  Difficulty concentrating or making decisions? 0 1  Walking or climbing stairs? 0 0  Dressing or bathing? 0 0  Doing errands, shopping? 0 0  Preparing Food and eating ? N   Using the Toilet? N   In the past six months, have  you accidently leaked urine? N   Do you have problems with loss of bowel control? N   Managing your Medications? N   Managing your Finances? N   Housekeeping or managing your Housekeeping? N     Patient Care Team: Smitty Cords, DO as PCP - General (Family Medicine) Rosey Bath, MD (Inactive) as Referring Physician (Hematology and Oncology) Minor, Theadora Rama, RN (Inactive) as [facility] Care Management  Indicate any recent Medical Services you may have received from other than [facility] providers in the past year (date may be approximate).     Assessment:   This is a routine wellness examination for [place].  Hearing/Vision screen Hearing Screening - Comments:: Denies hearing difficulties   Vision Screening - Comments:: Wears rx glasses - up to date with routine eye exams with  [facility]  Dietary issues and exercise activities discussed:     Goals Addressed               This Visit's Progress     Stay Healthy (pt-stated)         Depression Screen    09/14/2022    1:11 PM 07/12/2022   11:26 AM 01/02/2022    1:13 PM 06/19/2021   11:00 AM 03/27/2021    1:15 PM 12/08/2019   10:49 AM 11/20/2019   11:17 AM  PHQ 2/9 Scores  PHQ - 2 Score 0 1 1 0 0 0 0  PHQ- 9 Score  4 3 0 0      Fall Risk    09/14/2022    1:12 PM 07/12/2022   11:28 AM 01/02/2022    1:13 PM 06/19/2021   10:59 AM 03/27/2021    1:15 PM  Fall Risk   Falls  in the past year? 0 1 1 0 1  Number falls in past yr: 0 0 1 0 0  Injury with Fall? 0 0 0 0 0  Risk for fall due to : No Fall Risks Impaired balance/gait History of fall(s) No Fall Risks Other (Comment)  Follow up Falls prevention discussed Falls evaluation completed Falls evaluation completed Falls evaluation completed Falls evaluation completed    MEDICARE RISK AT HOME:  Medicare Risk at Home - 09/14/22 1316     Any stairs in or around the home? Yes    If so, are there any without handrails? No    Home free of loose throw rugs in walkways, pet beds, electrical cords, etc? Yes    Adequate lighting in your home to reduce risk of falls? Yes    Life alert? No    Use of a cane, walker or w/c? Yes    Grab bars in the bathroom? Yes    Shower chair or bench in shower? Yes    Elevated toilet seat or a handicapped toilet? Yes             TIMED UP AND GO:  Was the test performed?  No    Cognitive Function:        09/14/2022    1:13 PM 03/27/2021    1:24 PM 12/08/2019   10:52 AM 11/18/2018    9:47 AM 08/13/2017    1:42 PM  6CIT Screen  What Year? 0 points 0 points 0 points 0 points 0 points  What month? 0 points 0 points 0 points 0 points 0 points  What time? 0 points 0 points 0 points 0 points 0 points  Count back from 20 0 points  0 points 0 points 0 points 0 points  Months in reverse 0 points 0 points 0 points 0 points 0 points  Repeat phrase 0 points 0 points 0 points 0 points 0 points  Total Score 0 points 0 points 0 points 0 points 0 points    Immunizations Immunization History  Administered Date(s) Administered   Fluad Quad(high Dose 65+) 10/02/2018, 11/01/2021   Influenza, High Dose Seasonal PF 10/22/2014, 11/07/2017, 11/01/2021   Influenza, Seasonal, Injecte, Preservative Fre 10/24/2012   Influenza-Unspecified 10/24/2015, 10/29/2019, 10/19/2020   Moderna SARS-COV2 Booster Vaccination 12/28/2021   PFIZER(Purple Top)SARS-COV-2 Vaccination 02/12/2019, 03/05/2019,  11/04/2019   PNEUMOCOCCAL CONJUGATE-20 06/19/2021   PPD Test 06/16/2019   Pneumococcal Conjugate-13 01/14/2013   Rsv, Bivalent, Protein Subunit Rsvpref,pf Verdis Frederickson) 11/01/2021   Tdap 05/21/2013   Zoster Recombinant(Shingrix) 06/05/2021, 06/05/2021, 09/11/2021   Zoster, Live 01/30/2012    TDAP status: Up to date  Flu Vaccine status: Due, Education has been provided regarding the importance of this vaccine. Advised may receive this vaccine at local pharmacy or Health Dept. Aware to provide a copy of the vaccination record if obtained from local pharmacy or Health Dept. Verbalized acceptance and understanding.  Pneumococcal vaccine status: Up to date  Covid-19 vaccine status: Declined, Education has been provided regarding the importance of this vaccine but patient still declined. Advised may receive this vaccine at local pharmacy or Health Dept.or vaccine clinic. Aware to provide a copy of the vaccination record if obtained from local pharmacy or Health Dept. Verbalized acceptance and understanding.  Qualifies for Shingles Vaccine? Yes   Zostavax completed Yes   Shingrix Completed?: Yes  Screening Tests Health Maintenance  Topic Date Due   COVID-19 Vaccine (4 - 2023-24 season) 02/22/2022   INFLUENZA VACCINE  08/30/2022   DTaP/Tdap/Td (2 - Td or Tdap) 05/22/2023   Medicare Annual Wellness (AWV)  09/14/2023   Pneumonia Vaccine 2+ Years old  Completed   DEXA SCAN  Completed   Zoster Vaccines- Shingrix  Completed   HPV VACCINES  Aged Out    Health Maintenance  Health Maintenance Due  Topic Date Due   COVID-19 Vaccine (4 - 2023-24 season) 02/22/2022   INFLUENZA VACCINE  08/30/2022    Colorectal cancer screening: No longer required.   Mammogram status: No longer required due to Age.  Bone Density status: Completed 10/17/16. Results reflect: Bone density results: OSTEOPENIA. Repeat every   years.  Lung Cancer Screening: (Low Dose CT Chest recommended if Age 92-80 years, 20  pack-year currently smoking OR have quit w/in 15years.) does not qualify.     Additional Screening:  Hepatitis C Screening: does not qualify; Completed   Vision Screening: Recommended annual ophthalmology exams for early detection of glaucoma and other disorders of the eye. Is the patient up to date with their annual eye exam?  Yes  Who is the provider or what is the name of the office in which the patient attends annual eye exams? [facility] If pt is not established with a provider, would they like to be referred to a provider to establish care? No .   Dental Screening: Recommended annual dental exams for proper oral hygiene    Community Resource Referral / Chronic Care Management:  CRR required this visit?  No   CCM required this visit?  No     Plan:     I have personally reviewed and noted the following in the patient's chart:   Medical and social history Use of alcohol, tobacco or illicit drugs  Current medications and supplements including opioid prescriptions. Patient is not currently taking opioid prescriptions. Functional ability and status Nutritional status Physical activity Advanced directives List of other physicians Hospitalizations, surgeries, and ER visits in previous 12 months Vitals Screenings to include cognitive, depression, and falls Referrals and appointments  In addition, I have reviewed and discussed with patient certain preventive protocols, quality metrics, and best practice recommendations. A written personalized care plan for preventive services as well as general preventive health recommendations were provided to patient.     Tillie Rung, LPN   1/61/0960   After Visit Summary: (MyChart) Due to this being a telephonic visit, the after visit summary with patients personalized plan was offered to patient via MyChart   Nurse Notes: None

## 2022-09-14 NOTE — Telephone Encounter (Signed)
Called, spoke with Molly Maduro (Pharmacist) in regard to patient message about prescription for levothyroxine 25 mcg not having any refills. Medication was refilled 09/04/22 for 90 and 3 refills. Molly Maduro states that request was not received. He states that he will put the refill request in for patient and patient can get medication today.   Called patient to advise her that she should be hearing from the pharmacy when medication is ready for pick up.

## 2022-09-14 NOTE — Patient Instructions (Addendum)
Angela Bullock , Thank you for taking time to come for your Medicare Wellness Visit. I appreciate your ongoing commitment to your health goals. Please review the following plan we discussed and let me know if I can assist you in the future.   Referrals/Orders/Follow-Ups/Clinician Recommendations:   This is a list of the screening recommended for you and due dates:  Health Maintenance  Topic Date Due   COVID-19 Vaccine (4 - 2023-24 season) 02/22/2022   Flu Shot  08/30/2022   DTaP/Tdap/Td vaccine (2 - Td or Tdap) 05/22/2023   Medicare Annual Wellness Visit  09/14/2023   Pneumonia Vaccine  Completed   DEXA scan (bone density measurement)  Completed   Zoster (Shingles) Vaccine  Completed   HPV Vaccine  Aged Out    Advanced directives: (Copy Requested) Please bring a copy of your health care power of attorney and living will to the office to be added to your chart at your convenience.  Next Medicare Annual Wellness Visit scheduled for next year: Yes  Preventive Care 73 Years and Older, Female Preventive care refers to lifestyle choices and visits with your health care provider that can promote health and wellness. What does preventive care include? A yearly physical exam. This is also called an annual well check. Dental exams once or twice a year. Routine eye exams. Ask your health care provider how often you should have your eyes checked. Personal lifestyle choices, including: Daily care of your teeth and gums. Regular physical activity. Eating a healthy diet. Avoiding tobacco and drug use. Limiting alcohol use. Practicing safe sex. Taking low-dose aspirin every day. Taking vitamin and mineral supplements as recommended by your health care provider. What happens during an annual well check? The services and screenings done by your health care provider during your annual well check will depend on your age, overall health, lifestyle risk factors, and family history of disease. Counseling   Your health care provider may ask you questions about your: Alcohol use. Tobacco use. Drug use. Emotional well-being. Home and relationship well-being. Sexual activity. Eating habits. History of falls. Memory and ability to understand (cognition). Work and work Astronomer. Reproductive health. Screening  You may have the following tests or measurements: Height, weight, and BMI. Blood pressure. Lipid and cholesterol levels. These may be checked every 5 years, or more frequently if you are over 75 years old. Skin check. Lung cancer screening. You may have this screening every year starting at age 42 if you have a 30-pack-year history of smoking and currently smoke or have quit within the past 15 years. Fecal occult blood test (FOBT) of the stool. You may have this test every year starting at age 33. Flexible sigmoidoscopy or colonoscopy. You may have a sigmoidoscopy every 5 years or a colonoscopy every 10 years starting at age 51. Hepatitis C blood test. Hepatitis B blood test. Sexually transmitted disease (STD) testing. Diabetes screening. This is done by checking your blood sugar (glucose) after you have not eaten for a while (fasting). You may have this done every 1-3 years. Bone density scan. This is done to screen for osteoporosis. You may have this done starting at age 14. Mammogram. This may be done every 1-2 years. Talk to your health care provider about how often you should have regular mammograms. Talk with your health care provider about your test results, treatment options, and if necessary, the need for more tests. Vaccines  Your health care provider may recommend certain vaccines, such as: Influenza vaccine. This is  recommended every year. Tetanus, diphtheria, and acellular pertussis (Tdap, Td) vaccine. You may need a Td booster every 10 years. Zoster vaccine. You may need this after age 63. Pneumococcal 13-valent conjugate (PCV13) vaccine. One dose is recommended  after age 70. Pneumococcal polysaccharide (PPSV23) vaccine. One dose is recommended after age 24. Talk to your health care provider about which screenings and vaccines you need and how often you need them. This information is not intended to replace advice given to you by your health care provider. Make sure you discuss any questions you have with your health care provider. Document Released: 02/11/2015 Document Revised: 10/05/2015 Document Reviewed: 11/16/2014 Elsevier Interactive Patient Education  2017 [employer].  Fall Prevention in the Home Falls can cause injuries. They can happen to people of all ages. There are many things you can do to make your home safe and to help prevent falls. What can I do on the outside of my home? Regularly fix the edges of walkways and driveways and fix any cracks. Remove anything that might make you trip as you walk through a door, such as a raised step or threshold. Trim any bushes or trees on the path to your home. Use bright outdoor lighting. Clear any walking paths of anything that might make someone trip, such as rocks or tools. Regularly check to see if handrails are loose or broken. Make sure that both sides of any steps have handrails. Any raised decks and porches should have guardrails on the edges. Have any leaves, snow, or ice cleared regularly. Use sand or salt on walking paths during winter. Clean up any spills in your garage right away. This includes oil or grease spills. What can I do in the bathroom? Use night lights. Install grab bars by the toilet and in the tub and shower. Do not use towel bars as grab bars. Use non-skid mats or decals in the tub or shower. If you need to sit down in the shower, use a plastic, non-slip stool. Keep the floor dry. Clean up any water that spills on the floor as soon as it happens. Remove soap buildup in the tub or shower regularly. Attach bath mats securely with double-sided non-slip rug tape. Do not  have throw rugs and other things on the floor that can make you trip. What can I do in the bedroom? Use night lights. Make sure that you have a light by your bed that is easy to reach. Do not use any sheets or blankets that are too big for your bed. They should not hang down onto the floor. Have a firm chair that has side arms. You can use this for support while you get dressed. Do not have throw rugs and other things on the floor that can make you trip. What can I do in the kitchen? Clean up any spills right away. Avoid walking on wet floors. Keep items that you use a lot in easy-to-reach places. If you need to reach something above you, use a strong step stool that has a grab bar. Keep electrical cords out of the way. Do not use floor polish or wax that makes floors slippery. If you must use wax, use non-skid floor wax. Do not have throw rugs and other things on the floor that can make you trip. What can I do with my stairs? Do not leave any items on the stairs. Make sure that there are handrails on both sides of the stairs and use them. Fix handrails that are  broken or loose. Make sure that handrails are as long as the stairways. Check any carpeting to make sure that it is firmly attached to the stairs. Fix any carpet that is loose or worn. Avoid having throw rugs at the top or bottom of the stairs. If you do have throw rugs, attach them to the floor with carpet tape. Make sure that you have a light switch at the top of the stairs and the bottom of the stairs. If you do not have them, ask someone to add them for you. What else can I do to help prevent falls? Wear shoes that: Do not have high heels. Have rubber bottoms. Are comfortable and fit you well. Are closed at the toe. Do not wear sandals. If you use a stepladder: Make sure that it is fully opened. Do not climb a closed stepladder. Make sure that both sides of the stepladder are locked into place. Ask someone to hold it for  you, if possible. Clearly mark and make sure that you can see: Any grab bars or handrails. First and last steps. Where the edge of each step is. Use tools that help you move around (mobility aids) if they are needed. These include: Canes. Walkers. Scooters. Crutches. Turn on the lights when you go into a dark area. Replace any light bulbs as soon as they burn out. Set up your furniture so you have a clear path. Avoid moving your furniture around. If any of your floors are uneven, fix them. If there are any pets around you, be aware of where they are. Review your medicines with your doctor. Some medicines can make you feel dizzy. This can increase your chance of falling. Ask your doctor what other things that you can do to help prevent falls. This information is not intended to replace advice given to you by your health care provider. Make sure you discuss any questions you have with your health care provider. Document Released: 11/11/2008 Document Revised: 06/23/2015 Document Reviewed: 02/19/2014 Elsevier Interactive Patient Education  2017 [employer].

## 2022-09-14 NOTE — Telephone Encounter (Signed)
Patient states that the pharmacy does not have levothyroxine (SYNTHROID) 25 MCG tablet ready for her to pick up. Patient states that she did not pick up the Rx that was sent on 09/04/22. Please advise.

## 2022-09-30 ENCOUNTER — Other Ambulatory Visit: Payer: Self-pay | Admitting: Internal Medicine

## 2022-09-30 DIAGNOSIS — M159 Polyosteoarthritis, unspecified: Secondary | ICD-10-CM

## 2022-09-30 DIAGNOSIS — G8929 Other chronic pain: Secondary | ICD-10-CM

## 2022-10-03 NOTE — Telephone Encounter (Signed)
Requested medication (s) are due for refill today: routing for approval  Requested medication (s) are on the active medication list: yes  Last refill:  08/21/22  Future visit scheduled: yes  Notes to clinic:  Unable to refill per protocol, cannot delegate.      Requested Prescriptions  Pending Prescriptions Disp Refills   traMADol (ULTRAM) 50 MG tablet [Pharmacy Med Name: TRAMADOL 50MG  TABLETS] 30 tablet     Sig: TAKE 1 TABLET(50 MG) BY MOUTH EVERY 8 HOURS AS NEEDED     Not Delegated - Analgesics:  Opioid Agonists Failed - 09/30/2022  4:48 PM      Failed - This refill cannot be delegated      Failed - Urine Drug Screen completed in last 360 days      Passed - Valid encounter within last 3 months    Recent Outpatient Visits           2 months ago Benign hypertension with CKD (chronic kidney disease) stage III   [place] [facility] Smitty Cords, DO   9 months ago Acquired hypothyroidism   [place] [facility] Smitty Cords, DO   1 year ago Annual physical exam   [place] [facility] Smitty Cords, DO   1 year ago Benign hypertension with CKD (chronic kidney disease) stage III   [place] [facility], Netta Neat, DO   2 years ago Benign hypertension with CKD (chronic kidney disease) stage III   [place] [facility], Netta Neat, DO       Future Appointments             In 3 months Althea Charon, Netta Neat, DO  [facility], [facility]

## 2023-01-14 ENCOUNTER — Ambulatory Visit: Payer: Medicare Other | Admitting: Family Medicine

## 2023-01-17 ENCOUNTER — Ambulatory Visit: Payer: Medicare Other | Admitting: Family Medicine

## 2023-01-17 ENCOUNTER — Other Ambulatory Visit: Payer: Self-pay | Admitting: Family Medicine

## 2023-01-17 ENCOUNTER — Encounter: Payer: Self-pay | Admitting: Family Medicine

## 2023-01-17 VITALS — BP 140/82 | HR 91 | Ht 64.0 in | Wt 185.0 lb

## 2023-01-17 DIAGNOSIS — E039 Hypothyroidism, unspecified: Secondary | ICD-10-CM

## 2023-01-17 DIAGNOSIS — Z853 Personal history of malignant neoplasm of breast: Secondary | ICD-10-CM

## 2023-01-17 DIAGNOSIS — E782 Mixed hyperlipidemia: Secondary | ICD-10-CM

## 2023-01-17 DIAGNOSIS — N183 Chronic kidney disease, stage 3 unspecified: Secondary | ICD-10-CM | POA: Diagnosis not present

## 2023-01-17 DIAGNOSIS — I700 Atherosclerosis of aorta: Secondary | ICD-10-CM

## 2023-01-17 DIAGNOSIS — I129 Hypertensive chronic kidney disease with stage 1 through stage 4 chronic kidney disease, or unspecified chronic kidney disease: Secondary | ICD-10-CM | POA: Diagnosis not present

## 2023-01-17 DIAGNOSIS — E559 Vitamin D deficiency, unspecified: Secondary | ICD-10-CM

## 2023-01-17 DIAGNOSIS — R7309 Other abnormal glucose: Secondary | ICD-10-CM

## 2023-01-17 DIAGNOSIS — M150 Primary generalized (osteo)arthritis: Secondary | ICD-10-CM | POA: Diagnosis not present

## 2023-01-17 NOTE — Patient Instructions (Addendum)
Thank you for coming to the office today.  Keep on half dose minoxodil and same dose 40mg  lisinopril  DUE for FASTING BLOOD WORK (no food or drink after midnight before the lab appointment, only water or coffee without cream/sugar on the morning of)  SCHEDULE "Lab Only" visit in the morning at the clinic for lab draw in 6 MONTHS   - Make sure Lab Only appointment is at about 1 week before your next appointment, so that results will be available  For Lab Results, once available within 2-3 days of blood draw, you can can log in to MyChart online to view your results and a brief explanation. Also, we can discuss results at next follow-up visit.    Please schedule a Follow-up Appointment to: Return in about 6 months (around 07/18/2023) for 6 month fasting lab > 1 week later Medicare Yearly visit.  If you have any other questions or concerns, please feel free to call the office or send a message through MyChart. You may also schedule an earlier appointment if necessary.  Additionally, you may be receiving a survey about your experience at our office within a few days to 1 week by e-mail or mail. We value your feedback.  Saralyn Pilar, DO [facility], [place]

## 2023-01-17 NOTE — Progress Notes (Signed)
Subjective:    Patient ID: Angela Bullock, female    DOB: 01/22/1936, 87 y.o.   MRN: [id number]  Angela Bullock is a 87 y.o. female presenting on 01/17/2023 for Medical Management of Chronic Issues and Hypertension   HPI  Discussed the use of AI scribe software for clinical note transcription with the patient, who gave verbal consent to proceed.   The patient, with a history of hypertension, presents with concerns about increasing frequency of "senior moments," described as episodes of confusion and forgetfulness. 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 However, she still is doing well and has good functional memory and no impact to regular ADLs or other concerns.  Follow-up liver cyst ultrasound Recent results showed normal liver tissue on [place], no sign of cirrhosis or other liver disease, had . 3.6 x 2.4 x 3.5 cm hypoechoic lesion in the anterior aspect of the left lobe of the liver on [place]. They recommended MRI. - She is doing well asymptomatic Last imaging update MRI 11/2019 see below, unremarkable, stable Last imaging [place] after the MRI, stable, future reconsider MRI   Chronic Pain Multiple Joints / Osteoarthritis Multiple joints (knees, back) Currently doing well, she admits if active physically will get some pain and symptoms from her back and other joints. She takes something every day. Tylenol often, and then will take Tramadol AS NEEDED and then least often Meloxicam. She gains significant improvement on Tramadol and allows her to function and remain active She has CKD and prefers to limit Meloxicam NSAID - She is on Omega, Resveratrol, CoQ10, Glucosamine-Chondroitin   Taking Tylenol Arthritis and Tylenol PM at night with relief, less waking up Taking Tramadol AS NEEDED, not every day     CHRONIC HTN / CKD-III Asking about Minoxidil The patient also reports consistent blood pressure readings  at home, typically around 128, using a wrist cuff on her left arm. She is currently on Lisinopril 40mg  and has been advised by her dermatologist to halve this dose due to concurrent use of Minoxidil. However, she expresses hesitation about reducing her blood pressure medication.  Current Meds - Lisinopril 40mg  daily currently Reports good compliance, took meds today. Tolerating well, w/o complaints. Denies CP, dyspnea, HA, edema, dizziness / lightheadedness   She can check her home BP cuff on both arms. We cannot check L arm    Hair Loss, chronic She has tried topical minoxidil formulation topical for years. Mixed results. On Minoxidil per Derm    Breast Cancer Surveillance See prior history Update w/ CA27.29 and lab result is 36 (2024), negative result.   Hypothyroidism Has been on Levothyroxine daily low dose Normalized labs TSH T4 She feels better overall on medication she could tell a difference       01/17/2023   10:57 AM 09/14/2022    1:11 PM 07/12/2022   11:26 AM  Depression screen PHQ 2/9  Decreased Interest 0 0 0  Down, Depressed, Hopeless 0 0 1  PHQ - 2 Score 0 0 1  Altered sleeping 0  1  Tired, decreased energy 1  1  Change in appetite 0  0  Feeling bad or failure about yourself  0  1  Trouble concentrating 0  0  Moving slowly or fidgety/restless 0  0  Suicidal thoughts 0  0  PHQ-9 Score 1  4  Difficult doing work/chores   Not difficult at all  01/17/2023   10:57 AM 07/12/2022   11:27 AM 01/02/2022    1:14 PM 06/19/2021   11:00 AM  GAD 7 : Generalized Anxiety Score  Nervous, Anxious, on Edge 0 0 0 0  Control/stop worrying 0 0 0 0  Worry too much - different things 0 0 0 0  Trouble relaxing 0 0 0 0  Restless 0 0 0 0  Easily annoyed or irritable 0 1 1 0  Afraid - awful might happen 0 0 0 0  Total GAD 7 Score 0 1 1 0  Anxiety Difficulty  Not difficult at all Not difficult at all Not difficult at all    Social History   Tobacco Use    Smoking status: Never   Smokeless tobacco: Never  Vaping Use   Vaping status: Never Used  Substance Use Topics   Alcohol use: No    Alcohol/week: 0.0 standard drinks of alcohol   Drug use: No    Review of Systems Per HPI unless specifically indicated above     Objective:    BP (!) 140/82 (BP Location: Left Arm, Cuff Size: Normal)   Pulse 91   Ht 5\' 4"  (1.626 m)   Wt 185 lb (83.9 kg)   SpO2 93%   BMI 31.76 kg/m   Wt Readings from Last 3 Encounters:  01/17/23 185 lb (83.9 kg)  09/14/22 180 lb (81.6 kg)  07/12/22 185 lb (83.9 kg)    Physical Exam Vitals and nursing note reviewed.  Constitutional:      General: She is not in acute distress.    Appearance: She is well-developed. She is not diaphoretic.     Comments: Well-appearing, comfortable, cooperative  HENT:     Head: Normocephalic and atraumatic.  Eyes:     General:        Right eye: No discharge.        Left eye: No discharge.     Conjunctiva/sclera: Conjunctivae normal.  Neck:     Thyroid: No thyromegaly.  Cardiovascular:     Rate and Rhythm: Normal rate and regular rhythm.     Heart sounds: Normal heart sounds. No murmur heard. Pulmonary:     Effort: Pulmonary effort is normal. No respiratory distress.     Breath sounds: Normal breath sounds. No wheezing or rales.  Musculoskeletal:        General: Normal range of motion.     Cervical back: Normal range of motion and neck supple.  Lymphadenopathy:     Cervical: No cervical adenopathy.  Skin:    General: Skin is warm and dry.     Findings: No erythema or rash.  Neurological:     Mental Status: She is alert and oriented to person, place, and time.  Psychiatric:        Behavior: Behavior normal.     Comments: Well groomed, good eye contact, normal speech and thoughts       Results for orders placed or performed in visit on 07/04/22  Cancer antigen 27.29   Collection Time: 07/04/22 11:32 AM  Result Value Ref Range   CA 27.29 36 <38 U/mL  VITAMIN  D 25 Hydroxy (Vit-D Deficiency, Fractures)   Collection Time: 07/04/22 11:32 AM  Result Value Ref Range   Vit D, 25-Hydroxy 45 30 - 100 ng/mL  T4, free   Collection Time: 07/04/22 11:32 AM  Result Value Ref Range   Free T4 1.4 0.8 - 1.8 ng/dL  TSH   Collection Time: 07/04/22 11:32  AM  Result Value Ref Range   TSH 2.01 0.40 - 4.50 mIU/L  Hemoglobin A1c   Collection Time: 07/04/22 11:32 AM  Result Value Ref Range   Hgb A1c MFr Bld 5.6 <5.7 % of total Hgb   Mean Plasma Glucose 114 mg/dL   eAG (mmol/L) 6.3 mmol/L  Lipid panel   Collection Time: 07/04/22 11:32 AM  Result Value Ref Range   Cholesterol 150 <200 mg/dL   HDL 67 > OR = 50 mg/dL   Triglycerides 409 <811 mg/dL   LDL Cholesterol (Calc) 64 mg/dL (calc)   Total CHOL/HDL Ratio 2.2 <5.0 (calc)   Non-HDL Cholesterol (Calc) 83 <914 mg/dL (calc)  CBC with Differential/Platelet   Collection Time: 07/04/22 11:32 AM  Result Value Ref Range   WBC 3.9 3.8 - 10.8 Thousand/uL   RBC 4.56 3.80 - 5.10 Million/uL   Hemoglobin 14.1 11.7 - 15.5 g/dL   HCT 78.2 95.6 - 21.3 %   MCV 93.2 80.0 - 100.0 fL   MCH 30.9 27.0 - 33.0 pg   MCHC 33.2 32.0 - 36.0 g/dL   RDW 08.6 57.8 - 46.9 %   Platelets 152 140 - 400 Thousand/uL   MPV 11.2 7.5 - 12.5 fL   Neutro Abs 2,480 1,500 - 7,800 cells/uL   Lymphs Abs 1,030 850 - 3,900 cells/uL   Absolute Monocytes 289 200 - 950 cells/uL   Eosinophils Absolute 90 15 - 500 cells/uL   Basophils Absolute 12 0 - 200 cells/uL   Neutrophils Relative % 63.6 %   Total Lymphocyte 26.4 %   Monocytes Relative 7.4 %   Eosinophils Relative 2.3 %   Basophils Relative 0.3 %  COMPLETE METABOLIC PANEL WITH GFR   Collection Time: 07/04/22 11:32 AM  Result Value Ref Range   Glucose, Bld 95 65 - 99 mg/dL   BUN 16 7 - 25 mg/dL   Creat 6.29 (H) 5.28 - 0.95 mg/dL   eGFR 58 (L) > OR = 60 mL/min/1.62m2   BUN/Creatinine Ratio 17 6 - 22 (calc)   Sodium 140 135 - 146 mmol/L   Potassium 4.3 3.5 - 5.3 mmol/L   Chloride 108 98  - 110 mmol/L   CO2 22 20 - 32 mmol/L   Calcium 10.6 (H) 8.6 - 10.4 mg/dL   Total Protein 6.2 6.1 - 8.1 g/dL   Albumin 4.3 3.6 - 5.1 g/dL   Globulin 1.9 1.9 - 3.7 g/dL (calc)   AG Ratio 2.3 1.0 - 2.5 (calc)   Total Bilirubin 0.9 0.2 - 1.2 mg/dL   Alkaline phosphatase (APISO) 73 37 - 153 U/L   AST 16 10 - 35 U/L   ALT 10 6 - 29 U/L      Assessment & Plan:   Problem List Items Addressed This Visit     Atherosclerosis of aorta (HCC)   Relevant Medications   minoxidil (LONITEN) 2.5 MG tablet   Benign hypertension with CKD (chronic kidney disease) stage III - Primary   Relevant Medications   minoxidil (LONITEN) 2.5 MG tablet   History of breast cancer   Hyperlipidemia   Relevant Medications   minoxidil (LONITEN) 2.5 MG tablet   Osteoarthritis, multiple sites   Vitamin D deficiency     Assessment and Plan    Cognitive Concerns   Reports of increased "senior moments" with instances of confusion and hesitation. No evidence of significant cognitive impairment as patient still recognizes objects and her function.   -Continue to monitor for any significant changes or  worsening of symptoms.  Hypertension   Blood pressure slightly elevated at 140/88 in office, but patient reports readings around 128 at home. Patient is currently on Lisinopril 40mg  daily and half dose of Minoxidil.   -Continue Lisinopril 40mg  daily and half dose of Minoxidil.  Given lack of significant impact on her BP by Minoxidil -Check blood pressure at home and bring in device for calibration.   -Plan to recheck blood pressure at next visit.  General Health Maintenance   -Schedule blood panel for June 2025.   -Schedule general check-up appointment.         No orders of the defined types were placed in this encounter.   No orders of the defined types were placed in this encounter.   Follow up plan: Return in about 6 months (around 07/18/2023) for 6 month fasting lab > 1 week later Medicare Yearly  visit.  Future labs ordered for 07/11/23   Saralyn Pilar, DO [facility] 01/17/2023, 11:09 AM

## 2023-01-27 ENCOUNTER — Other Ambulatory Visit: Payer: Self-pay | Admitting: Family Medicine

## 2023-01-27 DIAGNOSIS — M150 Primary generalized (osteo)arthritis: Secondary | ICD-10-CM

## 2023-01-27 DIAGNOSIS — G8929 Other chronic pain: Secondary | ICD-10-CM

## 2023-01-31 NOTE — Telephone Encounter (Signed)
 Requested medication (s) are due for refill today:   Provider to review  Requested medication (s) are on the active medication list:   Yes  Future visit scheduled:   Yes 07/18/2023   Last ordered: 10/03/2022 #30, 2 refills  Non delegated refill    Requested Prescriptions  Pending Prescriptions Disp Refills   traMADol  (ULTRAM ) 50 MG tablet [Pharmacy Med Name: TRAMADOL  50MG  TABLETS] 30 tablet     Sig: TAKE 1 TABLET(50 MG) BY MOUTH EVERY 8 HOURS AS NEEDED FOR MODERATE PAIN OR SEVERE PAIN     Not Delegated - Analgesics:  Opioid Agonists Failed - 01/31/2023  3:11 PM      Failed - This refill cannot be delegated      Failed - Urine Drug Screen completed in last 360 days      Passed - Valid encounter within last 3 months    Recent Outpatient Visits           2 weeks ago Benign hypertension with CKD (chronic kidney disease) stage III   [place] [facility] [place], Marsa PARAS, DO   6 months ago Benign hypertension with CKD (chronic kidney disease) stage III   [place] [facility] [place] Marsa PARAS, DO   1 year ago Acquired hypothyroidism   [place] [facility] [place] Marsa PARAS, DO   1 year ago Annual physical exam   [place] [facility] [place] Marsa PARAS, DO   2 years ago Benign hypertension with CKD (chronic kidney disease) stage III   [place] [facility] [place], Marsa PARAS, DO       Future Appointments             In 5 months [place], Marsa PARAS, DO [place] [facility], [facility]

## 2023-02-13 DIAGNOSIS — L658 Other specified nonscarring hair loss: Secondary | ICD-10-CM | POA: Diagnosis not present

## 2023-03-05 ENCOUNTER — Other Ambulatory Visit: Payer: Self-pay | Admitting: Family Medicine

## 2023-03-05 DIAGNOSIS — I129 Hypertensive chronic kidney disease with stage 1 through stage 4 chronic kidney disease, or unspecified chronic kidney disease: Secondary | ICD-10-CM

## 2023-03-07 NOTE — Telephone Encounter (Signed)
 Requested Prescriptions  Pending Prescriptions Disp Refills   lisinopril  (ZESTRIL ) 40 MG tablet [Pharmacy Med Name: LISINOPRIL  40MG  TABLETS] 90 tablet 1    Sig: TAKE 1 TABLET(40 MG) BY MOUTH DAILY     Cardiovascular:  ACE Inhibitors Failed - 03/07/2023  9:17 AM      Failed - Cr in normal range and within 180 days    Creat  Date Value Ref Range Status  07/04/2022 0.96 (H) 0.60 - 0.95 mg/dL Final         Failed - K in normal range and within 180 days    Potassium  Date Value Ref Range Status  07/04/2022 4.3 3.5 - 5.3 mmol/L Final  10/13/2013 4.3 3.5 - 5.1 mmol/L Final         Failed - Last BP in normal range    BP Readings from Last 1 Encounters:  01/17/23 (!) 140/82         Passed - Patient is not pregnant      Passed - Valid encounter within last 6 months    Recent Outpatient Visits           1 month ago Benign hypertension with CKD (chronic kidney disease) stage III   [place] [facility] [place], Marsa PARAS, DO   7 months ago Benign hypertension with CKD (chronic kidney disease) stage III   [place] [facility] [place] Marsa PARAS, DO   1 year ago Acquired hypothyroidism   [place] [facility] [place] Marsa PARAS, DO   1 year ago Annual physical exam   [place] [facility] [place] Marsa PARAS, DO   2 years ago Benign hypertension with CKD (chronic kidney disease) stage III   [place] [facility] [place], Marsa PARAS, DO       Future Appointments             In 4 months Edman, Marsa PARAS, DO [place] [facility], [facility]

## 2023-05-12 ENCOUNTER — Other Ambulatory Visit: Payer: Self-pay | Admitting: Family Medicine

## 2023-05-12 DIAGNOSIS — G8929 Other chronic pain: Secondary | ICD-10-CM

## 2023-05-12 DIAGNOSIS — M150 Primary generalized (osteo)arthritis: Secondary | ICD-10-CM

## 2023-05-12 DIAGNOSIS — M545 Low back pain, unspecified: Secondary | ICD-10-CM

## 2023-05-14 NOTE — Telephone Encounter (Signed)
 Requested medication (s) are due for refill today: yes  Requested medication (s) are on the active medication list: yes  Last refill:  02/01/23  Future visit scheduled: yes  Notes to clinic:  Unable to refill per protocol, cannot delegate.      Requested Prescriptions  Pending Prescriptions Disp Refills   traMADol (ULTRAM) 50 MG tablet [Pharmacy Med Name: TRAMADOL 50MG  TABLETS] 30 tablet     Sig: TAKE 1 TABLET(50 MG) BY MOUTH EVERY 8 HOURS AS NEEDED FOR MODERATE PAIN OR SEVERE PAIN     Not Delegated - Analgesics:  Opioid Agonists Failed - 05/14/2023  9:35 AM      Failed - This refill cannot be delegated      Failed - Urine Drug Screen completed in last 360 days      Failed - Valid encounter within last 3 months    Recent Outpatient Visits   None     Future Appointments             In 2 months Karamalegos, Kayleen Party, DO [place] [facility], [facility]

## 2023-06-17 DIAGNOSIS — H538 Other visual disturbances: Secondary | ICD-10-CM | POA: Diagnosis not present

## 2023-06-17 DIAGNOSIS — H18513 Endothelial corneal dystrophy, bilateral: Secondary | ICD-10-CM | POA: Diagnosis not present

## 2023-06-17 DIAGNOSIS — H04123 Dry eye syndrome of bilateral lacrimal glands: Secondary | ICD-10-CM | POA: Diagnosis not present

## 2023-06-17 DIAGNOSIS — H2513 Age-related nuclear cataract, bilateral: Secondary | ICD-10-CM | POA: Diagnosis not present

## 2023-06-26 ENCOUNTER — Other Ambulatory Visit: Payer: Self-pay | Admitting: Family Medicine

## 2023-06-26 DIAGNOSIS — E785 Hyperlipidemia, unspecified: Secondary | ICD-10-CM

## 2023-06-28 NOTE — Telephone Encounter (Signed)
 Requested Prescriptions  Pending Prescriptions Disp Refills   atorvastatin  (LIPITOR) 10 MG tablet [Pharmacy Med Name: ATORVASTATIN  10MG  TABLETS] 90 tablet 0    Sig: TAKE 1 TABLET(10 MG) BY MOUTH AT BEDTIME     Cardiovascular:  Antilipid - Statins Failed - 06/28/2023 11:27 AM      Failed - Valid encounter within last 12 months    Recent Outpatient Visits   None     Future Appointments             In 2 weeks Raina Bunting, DO [place] [facility], [facility]            Failed - Lipid Panel in normal range within the last 12 months    Cholesterol  Date Value Ref Range Status  07/04/2022 150 <200 mg/dL Final   LDL Cholesterol (Calc)  Date Value Ref Range Status  07/04/2022 64 mg/dL (calc) Final    Comment:    Reference range: <100 . Desirable range <100 mg/dL for primary prevention;   <70 mg/dL for patients with CHD or diabetic patients  with > or = 2 CHD risk factors. Aaron Aas LDL-C is now calculated using the Martin-Hopkins  calculation, which is a validated novel method providing  better accuracy than the Friedewald equation in the  estimation of LDL-C.  Melinda Sprawls et al. Erroll Heard. 9518;841(66): 2061-2068  (http://education.[employer].com/faq/FAQ164)    HDL  Date Value Ref Range Status  07/04/2022 67 > OR = 50 mg/dL Final   Triglycerides  Date Value Ref Range Status  07/04/2022 103 <150 mg/dL Final         Passed - Patient is not pregnant

## 2023-07-04 ENCOUNTER — Other Ambulatory Visit: Payer: Self-pay

## 2023-07-04 ENCOUNTER — Telehealth: Payer: Self-pay

## 2023-07-04 DIAGNOSIS — E785 Hyperlipidemia, unspecified: Secondary | ICD-10-CM

## 2023-07-04 MED ORDER — ATORVASTATIN CALCIUM 10 MG PO TABS: 10.0000 mg | ORAL_TABLET | Freq: Every day | ORAL | 0 refills | Status: DC

## 2023-07-04 NOTE — Telephone Encounter (Signed)
 Copied from CRM [id number]. Topic: Clinical - Prescription Issue >> Jul 03, 2023  4:53 PM Magdalene School wrote: Reason for CRM: Patient called regarding prescription for atorvastatin  (LIPITOR) 10 MG tablet. She stated that when she logged in to MyChart it shows as it was sent on 06/28/23 but it is not showing online when she checks [facility] website and when she calls the pharmacy she gets an automated system which tells her there are no prescriptions for her phone number. She would like to verify that the prescription was sent because she will be running out of her meds soon.

## 2023-07-11 ENCOUNTER — Other Ambulatory Visit: Payer: Self-pay

## 2023-07-11 DIAGNOSIS — R7309 Other abnormal glucose: Secondary | ICD-10-CM | POA: Diagnosis not present

## 2023-07-11 DIAGNOSIS — E039 Hypothyroidism, unspecified: Secondary | ICD-10-CM | POA: Diagnosis not present

## 2023-07-11 DIAGNOSIS — I129 Hypertensive chronic kidney disease with stage 1 through stage 4 chronic kidney disease, or unspecified chronic kidney disease: Secondary | ICD-10-CM | POA: Diagnosis not present

## 2023-07-11 DIAGNOSIS — N183 Chronic kidney disease, stage 3 unspecified: Secondary | ICD-10-CM | POA: Diagnosis not present

## 2023-07-11 DIAGNOSIS — I700 Atherosclerosis of aorta: Secondary | ICD-10-CM

## 2023-07-11 DIAGNOSIS — E559 Vitamin D deficiency, unspecified: Secondary | ICD-10-CM | POA: Diagnosis not present

## 2023-07-11 DIAGNOSIS — E782 Mixed hyperlipidemia: Secondary | ICD-10-CM

## 2023-07-11 DIAGNOSIS — Z853 Personal history of malignant neoplasm of breast: Secondary | ICD-10-CM | POA: Diagnosis not present

## 2023-07-11 LAB — CBC WITH DIFFERENTIAL/PLATELET
Absolute Lymphocytes: 1008 {cells}/uL (ref 850–3900)
Absolute Monocytes: 281 {cells}/uL (ref 200–950)
Basophils Absolute: 21 {cells}/uL (ref 0–200)
Basophils Relative: 0.5 %
Eosinophils Absolute: 109 {cells}/uL (ref 15–500)
Eosinophils Relative: 2.6 %
HCT: 41 % (ref 35.0–45.0)
Hemoglobin: 13.2 g/dL (ref 11.7–15.5)
MCH: 30.5 pg (ref 27.0–33.0)
MCHC: 32.2 g/dL (ref 32.0–36.0)
MCV: 94.7 fL (ref 80.0–100.0)
MPV: 11.2 fL (ref 7.5–12.5)
Monocytes Relative: 6.7 %
Neutro Abs: 2780 {cells}/uL (ref 1500–7800)
Neutrophils Relative %: 66.2 %
Platelets: 175 10*3/uL (ref 140–400)
RBC: 4.33 10*6/uL (ref 3.80–5.10)
RDW: 11.9 % (ref 11.0–15.0)
Total Lymphocyte: 24 %
WBC: 4.2 10*3/uL (ref 3.8–10.8)

## 2023-07-11 LAB — HEMOGLOBIN A1C
Hgb A1c MFr Bld: 5.5 % (ref <5.7)
Mean Plasma Glucose: 111 mg/dL
eAG (mmol/L): 6.2 mmol/L

## 2023-07-12 DIAGNOSIS — L821 Other seborrheic keratosis: Secondary | ICD-10-CM | POA: Diagnosis not present

## 2023-07-12 DIAGNOSIS — L658 Other specified nonscarring hair loss: Secondary | ICD-10-CM | POA: Diagnosis not present

## 2023-07-12 DIAGNOSIS — Z08 Encounter for follow-up examination after completed treatment for malignant neoplasm: Secondary | ICD-10-CM | POA: Diagnosis not present

## 2023-07-12 DIAGNOSIS — D239 Other benign neoplasm of skin, unspecified: Secondary | ICD-10-CM | POA: Diagnosis not present

## 2023-07-12 DIAGNOSIS — L820 Inflamed seborrheic keratosis: Secondary | ICD-10-CM | POA: Diagnosis not present

## 2023-07-12 DIAGNOSIS — L538 Other specified erythematous conditions: Secondary | ICD-10-CM | POA: Diagnosis not present

## 2023-07-12 DIAGNOSIS — Z85828 Personal history of other malignant neoplasm of skin: Secondary | ICD-10-CM | POA: Diagnosis not present

## 2023-07-12 DIAGNOSIS — R208 Other disturbances of skin sensation: Secondary | ICD-10-CM | POA: Diagnosis not present

## 2023-07-17 ENCOUNTER — Ambulatory Visit: Payer: Self-pay | Admitting: Family Medicine

## 2023-07-18 ENCOUNTER — Encounter: Payer: Self-pay | Admitting: Family Medicine

## 2023-07-18 ENCOUNTER — Ambulatory Visit (INDEPENDENT_AMBULATORY_CARE_PROVIDER_SITE_OTHER): Payer: Self-pay | Admitting: Family Medicine

## 2023-07-18 VITALS — BP 132/80 | HR 101 | Ht 64.0 in | Wt 176.4 lb

## 2023-07-18 DIAGNOSIS — M25561 Pain in right knee: Secondary | ICD-10-CM | POA: Diagnosis not present

## 2023-07-18 DIAGNOSIS — I129 Hypertensive chronic kidney disease with stage 1 through stage 4 chronic kidney disease, or unspecified chronic kidney disease: Secondary | ICD-10-CM

## 2023-07-18 DIAGNOSIS — E039 Hypothyroidism, unspecified: Secondary | ICD-10-CM

## 2023-07-18 DIAGNOSIS — E782 Mixed hyperlipidemia: Secondary | ICD-10-CM | POA: Diagnosis not present

## 2023-07-18 DIAGNOSIS — M25562 Pain in left knee: Secondary | ICD-10-CM | POA: Diagnosis not present

## 2023-07-18 DIAGNOSIS — G8929 Other chronic pain: Secondary | ICD-10-CM

## 2023-07-18 DIAGNOSIS — M545 Low back pain, unspecified: Secondary | ICD-10-CM | POA: Diagnosis not present

## 2023-07-18 DIAGNOSIS — I700 Atherosclerosis of aorta: Secondary | ICD-10-CM

## 2023-07-18 DIAGNOSIS — E785 Hyperlipidemia, unspecified: Secondary | ICD-10-CM | POA: Diagnosis not present

## 2023-07-18 DIAGNOSIS — R7309 Other abnormal glucose: Secondary | ICD-10-CM | POA: Diagnosis not present

## 2023-07-18 DIAGNOSIS — N183 Chronic kidney disease, stage 3 unspecified: Secondary | ICD-10-CM | POA: Diagnosis not present

## 2023-07-18 DIAGNOSIS — M150 Primary generalized (osteo)arthritis: Secondary | ICD-10-CM | POA: Diagnosis not present

## 2023-07-18 MED ORDER — LEVOTHYROXINE SODIUM 25 MCG PO TABS: 25.0000 ug | ORAL_TABLET | Freq: Every day | ORAL | 3 refills | Status: AC

## 2023-07-18 MED ORDER — TRAMADOL HCL 50 MG PO TABS
50.0000 mg | ORAL_TABLET | Freq: Three times a day (TID) | ORAL | 2 refills | Status: DC | PRN
Start: 2023-07-18 — End: 2023-12-23

## 2023-07-18 MED ORDER — LISINOPRIL 40 MG PO TABS: 40.0000 mg | ORAL_TABLET | Freq: Every day | ORAL | 3 refills | Status: AC

## 2023-07-18 MED ORDER — ATORVASTATIN CALCIUM 10 MG PO TABS
10.0000 mg | ORAL_TABLET | Freq: Every day | ORAL | 3 refills | Status: AC
Start: 2023-07-18 — End: ?

## 2023-07-18 NOTE — Patient Instructions (Addendum)
 Thank you for coming to the office today.  Increase Tramadol  pills 30 to 45,  Okay to take with Ibuprofen 200-400mg  per dose 2-3 times per day WITH MEAL  Recent Labs    07/11/23 0825  HGBA1C 5.5    Excellent lab results  Cholesterol is controlled, refilled med.  Excellent labs overall  Reduce Calcium  supplement.  Consider heart scan in future.  No other vaccines due at this time.  Please schedule a Follow-up Appointment to: Return in about 6 months (around 01/17/2024) for 6 month Follow-up PreDM A1c, Arthritis, refill, HTN.  If you have any other questions or concerns, please feel free to call the office or send a message through MyChart. You may also schedule an earlier appointment if necessary.  Additionally, you may be receiving a survey about your experience at our office within a few days to 1 week by e-mail or mail. We value your feedback.  Domingo Friend, DO [facility], [place]

## 2023-07-18 NOTE — Progress Notes (Signed)
 Subjective:    Patient ID: Angela Bullock, female    DOB: 26-Mar-1935, 88 y.o.   MRN: [id number]  Angela Bullock is a 88 y.o. female presenting on 07/18/2023 for Annual Exam   HPI  Discussed the use of AI scribe software for clinical note transcription with the patient, who gave verbal consent to proceed.  History of Present Illness   Angela Bullock is an 88 year old female who presents for an annual physical exam.   Follow-up liver cyst ultrasound - She is doing well asymptomatic   Chronic Pain Multiple Joints / Osteoarthritis Multiple joints (knees, back) Currently doing well, she admits if active physically will get some pain and symptoms from her back and other joints. Increased episodic back pain flares worse with heavy lifting. She takes something every day. Tylenol  often, and then will take Tramadol  AS NEEDED and stopped taking Meloxicam . She gains significant improvement on Tramadol  and allows her to function and remain active, however sometimes she may warrant 2nd dose Tramadol  She has CKD and prefers to limit Meloxicam  NSAID - She is on Omega, Resveratrol, CoQ10, Glucosamine-Chondroitin Taking Tylenol  Arthritis and Tylenol  PM at night with relief, less waking up Taking Tramadol  AS NEEDED, not every day     CHRONIC HTN / CKD-III Asking about Minoxidil The patient also reports consistent blood pressure readings at home, typically around 128, using a wrist cuff on her left arm. She is currently on Lisinopril  40mg  and has been advised by her dermatologist to halve this dose due to concurrent use of Minoxidil. However, she expresses hesitation about reducing her blood pressure medication.  Current Meds - Lisinopril  40mg  daily currently Reports good compliance, took meds today. Tolerating well, w/o complaints. Denies CP, dyspnea, HA, edema, dizziness / lightheadedness   Hypercalcemia Mild elevated 10.5, goal is < 10.4 She does take daily calcium  supplement   Hair Loss,  chronic She has tried topical minoxidil formulation topical for years. Mixed results. On Minoxidil per Derm  A1c 5.5, prior 5.6    Breast Cancer Surveillance See prior history Update w/ CA27.29 and lab result is 29 (2025), negative result.   Hypothyroidism Has been on Levothyroxine  25mcg daily low dose Normalized labs TSH T4  HYPERLIPIDEMIA: - Reports no concerns. Last lipid panel 06/2023, controlled LDL 60, excellent improvement - Currently taking Atorvastatin  10mg , tolerating well without side effects or myalgias Not interested in CT Coronary Scan She also uses supplements like omega, turmeric, and resveratrol for pain relief and anti-inflammatory purposes.  Cataracts matured Ophthalmologist scheduled for both eyes, this summer   Health Maintenance:   She is up to date with her vaccinations, including COVID boosters, tetanus, pneumonia, and shingles     07/18/2023    9:00 AM 01/17/2023   10:57 AM 09/14/2022    1:11 PM  Depression screen PHQ 2/9  Decreased Interest 0 0 0  Down, Depressed, Hopeless 0 0 0  PHQ - 2 Score 0 0 0  Altered sleeping 0 0   Tired, decreased energy 1 1   Change in appetite 0 0   Feeling bad or failure about yourself  1 0   Trouble concentrating 0 0   Moving slowly or fidgety/restless 0 0   Suicidal thoughts 0 0   PHQ-9 Score 2 1        07/18/2023    9:00 AM 01/17/2023   10:57 AM 07/12/2022   11:27 AM 01/02/2022    1:14 PM  GAD 7 : Generalized Anxiety Score  Nervous, Anxious, on Edge 0 0 0 0  Control/stop worrying 0 0 0 0  Worry too much - different things 0 0 0 0  Trouble relaxing 0 0 0 0  Restless 0 0 0 0  Easily annoyed or irritable 0 0 1 1  Afraid - awful might happen 0 0 0 0  Total GAD 7 Score 0 0 1 1  Anxiety Difficulty   Not difficult at all Not difficult at all     Past Medical History:  Diagnosis Date   Allergy    Breast cancer (HCC) 01/30/2012   left, radiation and lumpectomy   Hypertension    Personal history of  radiation therapy    Past Surgical History:  Procedure Laterality Date   ABDOMINAL HYSTERECTOMY     BREAST BIOPSY Left 01/30/2011   negative   BREAST BIOPSY Left 01/30/2012   positive   BREAST CYST ASPIRATION Left    BREAST LUMPECTOMY Left 04/02/2012   positive   COLONOSCOPY WITH PROPOFOL  N/A 07/23/2017   Procedure: COLONOSCOPY WITH PROPOFOL  WITH BIOPSIES;  Surgeon: Luke Salaam, MD;  Location: [facility] ENDOSCOPY;  Service: Gastroenterology;  Laterality: N/A;   MOHS SURGERY  02/28/2021   Social History   Socioeconomic History   Marital status: Widowed    Spouse name: Not on file   Number of children: 1   Years of education: 63   Highest education level: Associate degree: occupational, Scientist, product/process development, or vocational program  Occupational History   Not on file  Tobacco Use   Smoking status: Never   Smokeless tobacco: Never  Vaping Use   Vaping status: Never Used  Substance and Sexual Activity   Alcohol use: No    Alcohol/week: 0.0 standard drinks of alcohol   Drug use: No   Sexual activity: Never  Other Topics Concern   Not on file  Social History Narrative   Not on file   Social Drivers of Health   Financial Resource Strain: Low Risk  (07/14/2023)   Overall Financial Resource Strain (CARDIA)    Difficulty of Paying Living Expenses: Not hard at all  Food Insecurity: No Food Insecurity (07/14/2023)   Hunger Vital Sign    Worried About Running Out of Food in the Last Year: Never true    Ran Out of Food in the Last Year: Never true  Transportation Needs: No Transportation Needs (07/14/2023)   PRAPARE - Administrator, Civil Service (Medical): No    Lack of Transportation (Non-Medical): No  Physical Activity: Insufficiently Active (07/14/2023)   Exercise Vital Sign    Days of Exercise per Week: 5 days    Minutes of Exercise per Session: 20 min  Stress: No Stress Concern Present (07/14/2023)   [employer] of Occupational Health - Occupational Stress Questionnaire     Feeling of Stress: Not at all  Social Connections: Socially Isolated (07/14/2023)   Social Connection and Isolation Panel    Frequency of Communication with Friends and Family: Once a week    Frequency of Social Gatherings with Friends and Family: More than three times a week    Attends Religious Services: Patient declined    Database administrator or Organizations: No    Attends Banker Meetings: Not on file    Marital Status: Widowed  Intimate Partner Violence: Not At Risk (09/14/2022)   Humiliation, Afraid, Rape, and Kick questionnaire    Fear of Current or Ex-Partner: No    Emotionally Abused: No    Physically  Abused: No    Sexually Abused: No   Family History  Problem Relation Age of Onset   Breast cancer Paternal Aunt 67   Osteoporosis Mother    Heart attack Father    Current Outpatient Medications on File Prior to Visit  Medication Sig   calcium  carbonate (OSCAL) 1500 (600 CA) MG TABS tablet Take 1 tablet by mouth every other day.   Calcium  Carbonate-Vitamin D  600-200 MG-UNIT TABS Take by mouth.   cholecalciferol (VITAMIN D ) 1000 UNITS tablet Take 1,000 Units by mouth daily.   co-enzyme Q-10 50 MG capsule Take 50 mg by mouth daily.   minoxidil (LONITEN) 2.5 MG tablet Take 1.25 mg by mouth daily.   Multiple Vitamins-Minerals (CENTRUM SILVER PO) Take 1 tablet by mouth daily.   Omega-3 Krill Oil 300 MG CAPS Take 1 capsule by mouth daily.   Resveratrol 250 MG CAPS    No current facility-administered medications on file prior to visit.    Review of Systems  Constitutional:  Negative for activity change, appetite change, chills, diaphoresis, fatigue and fever.  HENT:  Negative for congestion and hearing loss.   Eyes:  Negative for visual disturbance.  Respiratory:  Negative for cough, chest tightness, shortness of breath and wheezing.   Cardiovascular:  Negative for chest pain, palpitations and leg swelling.  Gastrointestinal:  Negative for abdominal pain,  constipation, diarrhea, nausea and vomiting.  Genitourinary:  Negative for dysuria, frequency and hematuria.  Musculoskeletal:  Negative for arthralgias and neck pain.  Skin:  Negative for rash.  Neurological:  Negative for dizziness, weakness, light-headedness, numbness and headaches.  Hematological:  Negative for adenopathy.  Psychiatric/Behavioral:  Negative for behavioral problems, dysphoric mood and sleep disturbance.    Per HPI unless specifically indicated above     Objective:    BP 132/80 (BP Location: Right Arm, Patient Position: Sitting, Cuff Size: Normal)   Pulse (!) 101   Ht 5' 4 (1.626 m)   Wt 176 lb 6 oz (80 kg)   SpO2 94%   BMI 30.27 kg/m   Wt Readings from Last 3 Encounters:  07/18/23 176 lb 6 oz (80 kg)  01/17/23 185 lb (83.9 kg)  09/14/22 180 lb (81.6 kg)    Physical Exam Vitals and nursing note reviewed.  Constitutional:      General: She is not in acute distress.    Appearance: She is well-developed. She is not diaphoretic.     Comments: Well-appearing, comfortable, cooperative  HENT:     Head: Normocephalic and atraumatic.   Eyes:     General:        Right eye: No discharge.        Left eye: No discharge.     Conjunctiva/sclera: Conjunctivae normal.     Pupils: Pupils are equal, round, and reactive to light.   Neck:     Thyroid : No thyromegaly.     Vascular: No carotid bruit.   Cardiovascular:     Rate and Rhythm: Normal rate and regular rhythm.     Pulses: Normal pulses.     Heart sounds: Murmur (systolic 2-3/6) heard.  Pulmonary:     Effort: Pulmonary effort is normal. No respiratory distress.     Breath sounds: Normal breath sounds. No wheezing or rales.  Abdominal:     General: Bowel sounds are normal. There is no distension.     Palpations: Abdomen is soft. There is no mass.     Tenderness: There is no abdominal tenderness.   Musculoskeletal:  General: No tenderness. Normal range of motion.     Cervical back: Normal range of  motion and neck supple.     Right lower leg: Edema (trace only bilateral) present.     Left lower leg: Edema present.     Comments: Upper / Lower Extremities: - Normal muscle tone, strength bilateral upper extremities 5/5, lower extremities 5/5  Lymphadenopathy:     Cervical: No cervical adenopathy.   Skin:    General: Skin is warm and dry.     Findings: No erythema or rash.   Neurological:     Mental Status: She is alert and oriented to person, place, and time.     Comments: Distal sensation intact to light touch all extremities  Psychiatric:        Mood and Affect: Mood normal.        Behavior: Behavior normal.        Thought Content: Thought content normal.     Comments: Well groomed, good eye contact, normal speech and thoughts     Results for orders placed or performed in visit on 07/11/23  Cancer antigen 27.29   Collection Time: 07/11/23  8:25 AM  Result Value Ref Range   CA 27.29 29 <38 U/mL  VITAMIN D  25 Hydroxy (Vit-D Deficiency, Fractures)   Collection Time: 07/11/23  8:25 AM  Result Value Ref Range   Vit D, 25-Hydroxy 76 30 - 100 ng/mL  T4, free   Collection Time: 07/11/23  8:25 AM  Result Value Ref Range   Free T4 1.7 0.8 - 1.8 ng/dL  TSH   Collection Time: 07/11/23  8:25 AM  Result Value Ref Range   TSH 1.55 0.40 - 4.50 mIU/L  COMPLETE METABOLIC PANEL WITH GFR   Collection Time: 07/11/23  8:25 AM  Result Value Ref Range   Glucose, Bld 96 65 - 99 mg/dL   BUN 19 7 - 25 mg/dL   Creat 1.61 0.96 - 0.45 mg/dL   BUN/Creatinine Ratio SEE NOTE: 6 - 22 (calc)   Sodium 140 135 - 146 mmol/L   Potassium 4.4 3.5 - 5.3 mmol/L   Chloride 106 98 - 110 mmol/L   CO2 24 20 - 32 mmol/L   Calcium  10.5 (H) 8.6 - 10.4 mg/dL   Total Protein 6.5 6.1 - 8.1 g/dL   Albumin 4.3 3.6 - 5.1 g/dL   Globulin 2.2 1.9 - 3.7 g/dL (calc)   AG Ratio 2.0 1.0 - 2.5 (calc)   Total Bilirubin 1.0 0.2 - 1.2 mg/dL   Alkaline phosphatase (APISO) 73 37 - 153 U/L   AST 13 10 - 35 U/L   ALT 9 6  - 29 U/L  CBC with Differential/Platelet   Collection Time: 07/11/23  8:25 AM  Result Value Ref Range   WBC 4.2 3.8 - 10.8 Thousand/uL   RBC 4.33 3.80 - 5.10 Million/uL   Hemoglobin 13.2 11.7 - 15.5 g/dL   HCT 40.9 81.1 - 91.4 %   MCV 94.7 80.0 - 100.0 fL   MCH 30.5 27.0 - 33.0 pg   MCHC 32.2 32.0 - 36.0 g/dL   RDW 78.2 95.6 - 21.3 %   Platelets 175 140 - 400 Thousand/uL   MPV 11.2 7.5 - 12.5 fL   Neutro Abs 2,780 1,500 - 7,800 cells/uL   Absolute Lymphocytes 1,008 850 - 3,900 cells/uL   Absolute Monocytes 281 200 - 950 cells/uL   Eosinophils Absolute 109 15 - 500 cells/uL   Basophils Absolute 21 0 - 200  cells/uL   Neutrophils Relative % 66.2 %   Total Lymphocyte 24.0 %   Monocytes Relative 6.7 %   Eosinophils Relative 2.6 %   Basophils Relative 0.5 %  Lipid panel   Collection Time: 07/11/23  8:25 AM  Result Value Ref Range   Cholesterol 141 <200 mg/dL   HDL 62 > OR = 50 mg/dL   Triglycerides 130 <865 mg/dL   LDL Cholesterol (Calc) 60 mg/dL (calc)   Total CHOL/HDL Ratio 2.3 <5.0 (calc)   Non-HDL Cholesterol (Calc) 79 <784 mg/dL (calc)  Hemoglobin O9G   Collection Time: 07/11/23  8:25 AM  Result Value Ref Range   Hgb A1c MFr Bld 5.5 <5.7 %   Mean Plasma Glucose 111 mg/dL   eAG (mmol/L) 6.2 mmol/L      Assessment & Plan:   Problem List Items Addressed This Visit     Acquired hypothyroidism   Relevant Medications   levothyroxine  (SYNTHROID ) 25 MCG tablet   Atherosclerosis of aorta (HCC)   Relevant Medications   atorvastatin  (LIPITOR) 10 MG tablet   lisinopril  (ZESTRIL ) 40 MG tablet   Benign hypertension with CKD (chronic kidney disease) stage III - Primary   Relevant Medications   atorvastatin  (LIPITOR) 10 MG tablet   lisinopril  (ZESTRIL ) 40 MG tablet   Bilateral chronic knee pain   Relevant Medications   traMADol  (ULTRAM ) 50 MG tablet   Chronic low back pain without sciatica   Relevant Medications   traMADol  (ULTRAM ) 50 MG tablet   Elevated hemoglobin A1c    Hyperlipidemia   Relevant Medications   atorvastatin  (LIPITOR) 10 MG tablet   lisinopril  (ZESTRIL ) 40 MG tablet   Osteoarthritis, multiple sites   Relevant Medications   traMADol  (ULTRAM ) 50 MG tablet     Updated Health Maintenance information Reviewed recent lab results with patient Encouraged improvement to lifestyle with diet and exercise Goal of weight loss  A1c stable to improved  Hyperlipidemia Aorta Atherosclerosis Controlled on Atorvastatin .  Hypothyroidism Levothyroxine  25mcg controlled  Lumbar DDD / Osteoarthritis / Chronic Pain Management Chronic back pain persists despite tramadol . Ibuprofen preferred for additional relief. Renal function remains good okay to take AS NEEDED NSAID - Increase tramadol  to 45 pills/month for additional dosing, not limited to max 1 per day. - Discontinue meloxicam , use ibuprofen as needed, OTC 200-400mg  AS NEEDED reviewed risks precautions with NSAID  Cataracts Cataracts matured, surgery scheduled for both eyes this summer. - Proceed with scheduled cataract surgery.  General Health Maintenance Annual wellness visit completed. Mild hypercalcemia noted. Renal, liver, glucose, and cholesterol levels are well controlled. Blood pressure and weight improved. - Continue atorvastatin  10 mg. - Reduce calcium  intake. - Schedule follow-up in six months.        No orders of the defined types were placed in this encounter.   Meds ordered this encounter  Medications   atorvastatin  (LIPITOR) 10 MG tablet    Sig: Take 1 tablet (10 mg total) by mouth daily.    Dispense:  90 tablet    Refill:  3    Add future refills   lisinopril  (ZESTRIL ) 40 MG tablet    Sig: Take 1 tablet (40 mg total) by mouth daily.    Dispense:  90 tablet    Refill:  3    Add future refills   traMADol  (ULTRAM ) 50 MG tablet    Sig: Take 1 tablet (50 mg total) by mouth every 8 (eight) hours as needed for moderate pain (pain score 4-6) or severe pain (pain  score  7-10).    Dispense:  45 tablet    Refill:  2    Increase pill count from 30 to 45   levothyroxine  (SYNTHROID ) 25 MCG tablet    Sig: Take 1 tablet (25 mcg total) by mouth daily before breakfast.    Dispense:  90 tablet    Refill:  3    Add refills     Follow up plan: Return in about 6 months (around 01/17/2024) for 6 month Follow-up PreDM A1c, Arthritis, refill, HTN.  Domingo Friend, DO [facility] 07/18/2023, 9:10 AM

## 2023-07-31 DIAGNOSIS — H2511 Age-related nuclear cataract, right eye: Secondary | ICD-10-CM | POA: Diagnosis not present

## 2023-07-31 DIAGNOSIS — H2513 Age-related nuclear cataract, bilateral: Secondary | ICD-10-CM | POA: Diagnosis not present

## 2023-08-07 DIAGNOSIS — H2513 Age-related nuclear cataract, bilateral: Secondary | ICD-10-CM | POA: Diagnosis not present

## 2023-08-07 DIAGNOSIS — H2511 Age-related nuclear cataract, right eye: Secondary | ICD-10-CM | POA: Diagnosis not present

## 2023-08-12 ENCOUNTER — Encounter: Payer: Self-pay | Admitting: Ophthalmology

## 2023-08-13 NOTE — Anesthesia Preprocedure Evaluation (Addendum)
 Anesthesia Evaluation  Patient identified by MRN, date of birth, ID band Patient awake    Reviewed: Allergy & Precautions, H&P , NPO status , Patient's Chart, lab work & pertinent test results  Airway Mallampati: IV  TM Distance: <3 FB Neck ROM: Full  Mouth opening: Limited Mouth Opening  Dental no notable dental hx. (+) Poor Dentition, Missing Missing lower left central incisor:   Pulmonary neg pulmonary ROS   Pulmonary exam normal breath sounds clear to auscultation       Cardiovascular hypertension, negative cardio ROS Normal cardiovascular exam+ Valvular Problems/Murmurs  Rhythm:Regular Rate:Normal  Epic shows atherosclerosis of aorta. Do not see any echo in epic. Do not see heart cath.  The aortic atherosclerosis is reported on CT scan.    Neuro/Psych  Neuromuscular disease negative neurological ROS  negative psych ROS   GI/Hepatic negative GI ROS, Neg liver ROS, hiatal hernia,GERD  ,,  Endo/Other  negative endocrine ROSHypothyroidism    Renal/GU Renal diseasenegative Renal ROS  negative genitourinary   Musculoskeletal negative musculoskeletal ROS (+) Arthritis ,    Abdominal   Peds negative pediatric ROS (+)  Hematology negative hematology ROS (+)   Anesthesia Other Findings  Allergy  Hypertension Personal history of radiation therapy  Breast cancer (HCC) Heart murmur  Arthritis Benign hypertension with CKD (chronic kidney disease) stage III (HCC) Atherosclerosis of aorta (HCC) Large hiatal hernia  GERD (gastroesophageal reflux disease    Reproductive/Obstetrics negative OB ROS                              Anesthesia Physical Anesthesia Plan  ASA: 3  Anesthesia Plan: MAC   Post-op Pain Management:    Induction: Intravenous  PONV Risk Score and Plan:   Airway Management Planned: Natural Airway and Nasal Cannula  Additional Equipment:   Intra-op Plan:    Post-operative Plan:   Informed Consent: I have reviewed the patients History and Physical, chart, labs and discussed the procedure including the risks, benefits and alternatives for the proposed anesthesia with the patient or authorized representative who has indicated his/her understanding and acceptance.     Dental Advisory Given  Plan Discussed with: Anesthesiologist, CRNA and Surgeon  Anesthesia Plan Comments: (Patient consented for risks of anesthesia including but not limited to:  - adverse reactions to medications - damage to eyes, teeth, lips or other oral mucosa - nerve damage due to positioning  - sore throat or hoarseness - Damage to heart, brain, nerves, lungs, other parts of body or loss of life  Patient voiced understanding and assent.)         Anesthesia Quick Evaluation

## 2023-08-19 NOTE — Discharge Instructions (Signed)

## 2023-08-21 ENCOUNTER — Encounter: Payer: Self-pay | Admitting: Ophthalmology

## 2023-08-21 ENCOUNTER — Ambulatory Visit
Admission: RE | Admit: 2023-08-21 | Discharge: 2023-08-21 | Disposition: A | Discharge status: Home / Self Care | Attending: Ophthalmology | Admitting: Ophthalmology

## 2023-08-21 ENCOUNTER — Encounter
Admission: RE | Disposition: A | Discharge status: Home / Self Care | Payer: Self-pay | Source: Home / Self Care | Attending: Ophthalmology

## 2023-08-21 ENCOUNTER — Ambulatory Visit: Payer: Self-pay | Admitting: Anesthesiology

## 2023-08-21 ENCOUNTER — Other Ambulatory Visit: Payer: Self-pay

## 2023-08-21 DIAGNOSIS — K449 Diaphragmatic hernia without obstruction or gangrene: Secondary | ICD-10-CM | POA: Insufficient documentation

## 2023-08-21 DIAGNOSIS — H2511 Age-related nuclear cataract, right eye: Secondary | ICD-10-CM | POA: Diagnosis present

## 2023-08-21 DIAGNOSIS — N183 Chronic kidney disease, stage 3 unspecified: Secondary | ICD-10-CM | POA: Diagnosis not present

## 2023-08-21 DIAGNOSIS — E039 Hypothyroidism, unspecified: Secondary | ICD-10-CM | POA: Diagnosis not present

## 2023-08-21 DIAGNOSIS — Z79890 Hormone replacement therapy (postmenopausal): Secondary | ICD-10-CM | POA: Insufficient documentation

## 2023-08-21 DIAGNOSIS — K219 Gastro-esophageal reflux disease without esophagitis: Secondary | ICD-10-CM | POA: Diagnosis not present

## 2023-08-21 DIAGNOSIS — I129 Hypertensive chronic kidney disease with stage 1 through stage 4 chronic kidney disease, or unspecified chronic kidney disease: Secondary | ICD-10-CM | POA: Insufficient documentation

## 2023-08-21 DIAGNOSIS — I10 Essential (primary) hypertension: Secondary | ICD-10-CM | POA: Diagnosis not present

## 2023-08-21 HISTORY — DX: Diaphragmatic hernia without obstruction or gangrene: K44.9

## 2023-08-21 HISTORY — DX: Chronic kidney disease, stage 3 unspecified: N18.30

## 2023-08-21 HISTORY — PX: CATARACT EXTRACTION W/PHACO: SHX586

## 2023-08-21 HISTORY — DX: Cardiac murmur, unspecified: R01.1

## 2023-08-21 HISTORY — DX: Gastro-esophageal reflux disease without esophagitis: K21.9

## 2023-08-21 HISTORY — DX: Atherosclerosis of aorta: I70.0

## 2023-08-21 HISTORY — DX: Unspecified osteoarthritis, unspecified site: M19.90

## 2023-08-21 SURGERY — PHACOEMULSIFICATION, CATARACT, WITH IOL INSERTION
Anesthesia: Monitor Anesthesia Care | Site: Eye | Laterality: Right

## 2023-08-21 MED ORDER — SIGHTPATH DOSE#1 NA HYALUR & NA CHOND-NA HYALUR IO KIT
PACK | INTRAOCULAR | Status: DC | PRN
  Administered 2023-08-21: 1 via OPHTHALMIC

## 2023-08-21 MED ORDER — LIDOCAINE HCL (PF) 2 % IJ SOLN
INTRAOCULAR | Status: DC | PRN
  Administered 2023-08-21: 2 mL

## 2023-08-21 MED ORDER — SIGHTPATH DOSE#1 BSS IO SOLN
INTRAOCULAR | Status: DC | PRN
  Administered 2023-08-21: 90 mL via OPHTHALMIC

## 2023-08-21 MED ORDER — FENTANYL CITRATE (PF) 100 MCG/2ML IJ SOLN
INTRAMUSCULAR | Status: AC
Start: 2023-08-21 — End: 2023-08-21
  Filled 2023-08-21: qty 2

## 2023-08-21 MED ORDER — BRIMONIDINE TARTRATE-TIMOLOL 0.2-0.5 % OP SOLN
OPHTHALMIC | Status: DC | PRN
Start: 2023-08-21 — End: 2023-08-21
  Administered 2023-08-21: 1 [drp] via OPHTHALMIC

## 2023-08-21 MED ORDER — ARMC OPHTHALMIC DILATING DROPS
OPHTHALMIC | Status: AC
  Filled 2023-08-21: qty 0.5

## 2023-08-21 MED ORDER — FENTANYL CITRATE (PF) 100 MCG/2ML IJ SOLN
INTRAMUSCULAR | Status: DC | PRN
  Administered 2023-08-21 (×2): 50 ug via INTRAVENOUS

## 2023-08-21 MED ORDER — SIGHTPATH DOSE#1 BSS IO SOLN
INTRAOCULAR | Status: DC | PRN
  Administered 2023-08-21: 15 mL via INTRAOCULAR

## 2023-08-21 MED ORDER — TETRACAINE HCL 0.5 % OP SOLN
OPHTHALMIC | Status: AC
  Filled 2023-08-21: qty 4

## 2023-08-21 MED ORDER — ARMC OPHTHALMIC DILATING DROPS
1.0000 | OPHTHALMIC | Status: DC | PRN
  Administered 2023-08-21 (×3): 1 via OPHTHALMIC

## 2023-08-21 MED ORDER — LACTATED RINGERS IV SOLN: INTRAVENOUS | Status: DC

## 2023-08-21 MED ORDER — MOXIFLOXACIN HCL 0.5 % OP SOLN
OPHTHALMIC | Status: DC | PRN
  Administered 2023-08-21: .2 mL via OPHTHALMIC

## 2023-08-21 MED ORDER — TETRACAINE HCL 0.5 % OP SOLN
1.0000 [drp] | OPHTHALMIC | Status: DC | PRN
  Administered 2023-08-21 (×3): 1 [drp] via OPHTHALMIC

## 2023-08-21 SURGICAL SUPPLY — 10 items
CATARACT SUITE SIGHTPATH (MISCELLANEOUS) ×1 IMPLANT
FEE CATARACT SUITE SIGHTPATH (MISCELLANEOUS) ×1 IMPLANT
GLOVE BIOGEL PI IND STRL 8 (GLOVE) ×1 IMPLANT
GLOVE SURG LX STRL 7.5 STRW (GLOVE) ×1 IMPLANT
GLOVE SURG SYN 6.5 PF PI BL (GLOVE) ×1 IMPLANT
LENS IOL CLRN VT TRC 4 19.5 IMPLANT
NDL FILTER BLUNT 18X1 1/2 (NEEDLE) ×1 IMPLANT
NEEDLE FILTER BLUNT 18X1 1/2 (NEEDLE) ×1 IMPLANT
RING MALYGIN 7.0 (MISCELLANEOUS) IMPLANT
SYR 3ML LL SCALE MARK (SYRINGE) ×1 IMPLANT

## 2023-08-21 NOTE — H&P (Signed)
 [facility]   Primary Care Physician:  Edman Marsa PARAS, DO Ophthalmologist: Dr. Dene Etienne  Pre-Procedure History & Physical: HPI:  Angela Bullock is a 88 y.o. female here for ophthalmic surgery.   Past Medical History:  Diagnosis Date   Allergy    Arthritis    Atherosclerosis of aorta (HCC)    Benign hypertension with CKD (chronic kidney disease) stage III (HCC)    Breast cancer (HCC) 01/30/2012   left, radiation and lumpectomy   GERD (gastroesophageal reflux disease)    Heart murmur    Hypertension    Large hiatal hernia    Personal history of radiation therapy     Past Surgical History:  Procedure Laterality Date   ABDOMINAL HYSTERECTOMY     BREAST BIOPSY Left 01/30/2011   negative   BREAST BIOPSY Left 01/30/2012   positive   BREAST CYST ASPIRATION Left    BREAST LUMPECTOMY Left 04/02/2012   positive   COLONOSCOPY WITH PROPOFOL  N/A 07/23/2017   Procedure: COLONOSCOPY WITH PROPOFOL  WITH BIOPSIES;  Surgeon: Therisa Bi, MD;  Location: [facility] END[facility]COPY;  Service: Gastroenterology;  Laterality: N/A;   MOHS SURGERY  02/28/2021    Prior to Admission medications   Medication Sig Start Date End Date Taking? Authorizing Provider  atorvastatin  (LIPITOR) 10 MG tablet Take 1 tablet (10 mg total) by mouth daily. 07/18/23  Yes Karamalegos, Marsa PARAS, DO  Calcium  Carbonate-Vitamin D  600-200 MG-UNIT TABS Take by mouth 3 (three) times a week.   Yes [provider]  cholecalciferol (VITAMIN D ) 1000 UNITS tablet Take 1,000 Units by mouth daily.   Yes [provider]  co-enzyme Q-10 50 MG capsule Take 50 mg by mouth daily.   Yes [provider]  diphenhydramine-acetaminophen  (TYLENOL  PM) 25-500 MG TABS tablet Take 1 tablet by mouth at bedtime.   Yes [provider]  levothyroxine  (SYNTHROID ) 25 MCG tablet Take 1 tablet (25 mcg total) by mouth daily before breakfast. 07/18/23  Yes Karamalegos, Marsa PARAS, DO  lisinopril   (ZESTRIL ) 40 MG tablet Take 1 tablet (40 mg total) by mouth daily. 07/18/23  Yes Karamalegos, Marsa PARAS, DO  minoxidil (LONITEN) 2.5 MG tablet Take 1.25 mg by mouth daily. 12/31/22  Yes [provider]  Multiple Vitamins-Minerals (CENTRUM SILVER PO) Take 1 tablet by mouth daily.   Yes [provider]  Omega-3 Krill Oil 300 MG CAPS Take 1 capsule by mouth daily.   Yes [provider]  Resveratrol 250 MG CAPS  05/20/99  Yes [provider]  traMADol  (ULTRAM ) 50 MG tablet Take 1 tablet (50 mg total) by mouth every 8 (eight) hours as needed for moderate pain (pain score 4-6) or severe pain (pain score 7-10). 07/18/23  Yes Karamalegos, Marsa PARAS, DO  calcium  carbonate (OSCAL) 1500 (600 CA) MG TABS tablet Take 1 tablet by mouth every other day. Patient not taking: Reported on 08/12/2023    [provider]    Allergies as of 07/18/2023 - Review Complete 07/18/2023  Allergen Reaction Noted   Penicillins Rash 10/28/2015    Family History  Problem Relation Age of Onset   Breast cancer Paternal Aunt 68   Osteoporosis Mother    Heart attack Father     Social History   Socioeconomic History   Marital status: Widowed    Spouse name: Not on file   Number of children: 1   Years of education: 63   Highest education level: Associate degree: occupational, Scientist, product/process development, or vocational program  Occupational History  Not on file  Tobacco Use   Smoking status: Never   Smokeless tobacco: Never  Vaping Use   Vaping status: Never Used  Substance and Sexual Activity   Alcohol use: No    Alcohol/week: 0.0 standard drinks of alcohol   Drug use: No   Sexual activity: Never  Other Topics Concern   Not on file  Social History Narrative   Not on file   Social Drivers of Health   Financial Resource Strain: Low Risk  (07/14/2023)   Overall Financial Resource Strain (CARDIA)    Difficulty of Paying Living Expenses: Not hard at all  Food Insecurity: No Food  Insecurity (07/14/2023)   Hunger Vital Sign    Worried About Running Out of Food in the Last Year: Never true    Ran Out of Food in the Last Year: Never true  Transportation Needs: No Transportation Needs (07/14/2023)   PRAPARE - Administrator, Civil Service (Medical): No    Lack of Transportation (Non-Medical): No  Physical Activity: Insufficiently Active (07/14/2023)   Exercise Vital Sign    Days of Exercise per Week: 5 days    Minutes of Exercise per Session: 20 min  Stress: No Stress Concern Present (07/14/2023)   [employer] of Occupational Health - Occupational Stress Questionnaire    Feeling of Stress: Not at all  Social Connections: Socially Isolated (07/14/2023)   Social Connection and Isolation Panel    Frequency of Communication with Friends and Family: Once a week    Frequency of Social Gatherings with Friends and Family: More than three times a week    Attends Religious Services: Patient declined    Database administrator or Organizations: No    Attends Banker Meetings: Not on file    Marital Status: Widowed  Intimate Partner Violence: Not At Risk (09/14/2022)   Humiliation, Afraid, Rape, and Kick questionnaire    Fear of Current or Ex-Partner: No    Emotionally Abused: No    Physically Abused: No    Sexually Abused: No    Review of Systems: See HPI, otherwise negative ROS  Physical Exam: BP (!) 173/72   Temp (!) 97.3 F (36.3 C) (Temporal)   Resp 12   Ht 5' 4.02 (1.626 m)   Wt 79.2 kg   SpO2 97%   BMI 29.97 kg/m  General:   Alert,  pleasant and cooperative in NAD Head:  Normocephalic and atraumatic. Lungs:  Clear to auscultation.    Heart:  Regular rate and rhythm.   Impression/Plan: Angela Bullock is here for ophthalmic surgery.  Risks, benefits, limitations, and alternatives regarding ophthalmic surgery have been reviewed with the patient.  Questions have been answered.  All parties agreeable.   Angela GASKIN, MD   08/21/2023, 8:55 AM

## 2023-08-21 NOTE — Op Note (Signed)
 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 Item Serial No. Manufacturer Lot No. LRB No. Used Action  LENS IOL CLRN VT TRC 4 19.5 - [id number]  LENS IOL CLRN VT TRC 4 19.5 [id number] SIGHTPATH  Right 1 Implanted     CNWET4 19.5 Vivity Toric intraocular lens with 2.25 diopters of cylindrical power with axis orientation at 1 degrees.   SURGEON:  Dene FABIENE Etienne, MD   ANESTHESIA: Topical with tetracaine  drops and 2% Xylocaine  jelly, augmented with 1% preservative-free intracameral lidocaine . .   COMPLICATIONS:  None.   DESCRIPTION OF PROCEDURE:  The patient was identified in the holding room and transported to the operating suite and placed in the supine position under the operating microscope.  The right eye was identified as the operative eye, and it was prepped and draped in the usual sterile ophthalmic fashion.    A clear-corneal paracentesis incision was made at the 12:00 position.  0.5 ml of preservative-free 1% lidocaine  was injected into the anterior chamber. The anterior chamber was filled with Viscoat. A malyugin ring was inserted to expand the pupil to 7mm. A 2.4 millimeter near clear corneal incision was then made at the 9:00 position.  A cystotome and capsulorrhexis forceps were then used to make a curvilinear capsulorrhexis.  Hydrodissection and hydrodelineation were then performed using balanced salt solution.   Phacoemulsification was then used in stop and chop fashion to remove the lens, nucleus and epinucleus.  The remaining cortex  was aspirated using the irrigation and aspiration handpiece.  Provisc viscoelastic was then placed into the capsular bag to distend it for lens placement.  The Verion digital marker was used to align the implant at the intended axis.   A Toric lens was then injected into the capsular bag.  It was rotated clockwise until the axis marks on the lens were approximately 15 degrees in the counterclockwise direction to the intended alignment. The Malyugin ring was removed. The viscoelastic was aspirated from the eye using the irrigation aspiration handpiece.  Then, a Koch spatula through the sideport incision was used to rotate the lens in a clockwise direction until the axis markings of the intraocular lens were lined up with the Verion alignment.  Balanced salt solution was then used to hydrate the wounds. Cefuroxime 0.1 ml of a 10mg /ml solution was injected into the anterior chamber for a dose of 1 mg of intracameral antibiotic at the completion of the case.    The eye was noted to have a physiologic pressure and there was no wound leak noted.   Timolol  and Brimonidine  drops were applied to the eye.  The patient was taken to the recovery room in stable condition having had no complications of anesthesia or surgery.  Elinore Shults 08/21/2023, 9:41 AM

## 2023-08-21 NOTE — Transfer of Care (Signed)
 Immediate Anesthesia Transfer of Care Note  Patient: Angela Bullock  Procedure(s) Performed: PHACOEMULSIFICATION, CATARACT, WITH IOL INSERTION 11.15 00:50.0 (Right: Eye)  Patient Location: PACU  Anesthesia Type: MAC  Level of Consciousness: awake, alert  and patient cooperative  Airway and Oxygen Therapy: Patient Spontanous Breathing and Patient connected to supplemental oxygen  Post-op Assessment: Post-op Vital signs reviewed, Patient's Cardiovascular Status Stable, Respiratory Function Stable, Patent Airway and No signs of Nausea or vomiting  Post-op Vital Signs: Reviewed and stable  Complications: No notable events documented.

## 2023-08-21 NOTE — Anesthesia Postprocedure Evaluation (Signed)
 Anesthesia Post Note  Patient: Angela Bullock  Procedure(s) Performed: PHACOEMULSIFICATION, CATARACT, WITH IOL INSERTION 11.15 00:50.0 (Right: Eye)  Patient location during evaluation: PACU Anesthesia Type: MAC Level of consciousness: awake and alert Pain management: pain level controlled Vital Signs Assessment: post-procedure vital signs reviewed and stable Respiratory status: spontaneous breathing, nonlabored ventilation, respiratory function stable and patient connected to nasal cannula oxygen Cardiovascular status: stable and blood pressure returned to baseline Postop Assessment: no apparent nausea or vomiting Anesthetic complications: no   No notable events documented.   Last Vitals:  Vitals:   08/21/23 0943 08/21/23 0947  BP: (!) 149/80 (!) 154/87  Pulse: (!) 102 95  Resp: 14 16  Temp: (!) 36.2 C (!) 36.2 C  SpO2: 99% 99%    Last Pain:  Vitals:   08/21/23 0947  TempSrc:   PainSc: 0-No pain                 Donny [name]

## 2023-08-22 ENCOUNTER — Encounter: Payer: Self-pay | Admitting: Ophthalmology

## 2023-08-22 ENCOUNTER — Other Ambulatory Visit: Payer: Self-pay

## 2023-08-28 NOTE — Anesthesia Preprocedure Evaluation (Addendum)
 Anesthesia Evaluation  Patient identified by MRN, date of birth, ID band Patient awake    Reviewed: Allergy & Precautions, H&P , NPO status , Patient's Chart, lab work & pertinent test results  Airway Mallampati: IV  TM Distance: <3 FB Neck ROM: Full    Dental no notable dental hx. (+) Missing Missing lower left central incisor: :   Pulmonary neg pulmonary ROS   Pulmonary exam normal breath sounds clear to auscultation       Cardiovascular hypertension, Normal cardiovascular exam+ Valvular Problems/Murmurs  Rhythm:Regular Rate:Normal     Neuro/Psych  Neuromuscular disease negative neurological ROS  negative psych ROS   GI/Hepatic negative GI ROS, Neg liver ROS, hiatal hernia,GERD  ,,  Endo/Other  negative endocrine ROSHypothyroidism    Renal/GU Renal diseasenegative Renal ROS  negative genitourinary   Musculoskeletal negative musculoskeletal ROS (+) Arthritis ,    Abdominal   Peds negative pediatric ROS (+)  Hematology negative hematology ROS (+)   Anesthesia Other Findings Previous cataract surgery 08-21-23 Dr. Ola  Had fentanyl  100 mcg IV last time, no versed.   Allergy  Hypertension Personal history of radiation therapy  Breast cancer (HCC) Heart murmur  Arthritis Benign hypertension with CKD (chronic kidney disease) stage III (HCC)  Atherosclerosis of aorta (HCC) Large hiatal hernia  GERD (gastroesophageal reflux disease    Reproductive/Obstetrics negative OB ROS                              Anesthesia Physical Anesthesia Plan  ASA: 3  Anesthesia Plan: MAC   Post-op Pain Management:    Induction: Intravenous  PONV Risk Score and Plan:   Airway Management Planned: Natural Airway and Nasal Cannula  Additional Equipment:   Intra-op Plan:   Post-operative Plan:   Informed Consent: I have reviewed the patients History and Physical, chart, labs and discussed the  procedure including the risks, benefits and alternatives for the proposed anesthesia with the patient or authorized representative who has indicated his/her understanding and acceptance.     Dental Advisory Given  Plan Discussed with: Anesthesiologist, CRNA and Surgeon  Anesthesia Plan Comments: (Patient consented for risks of anesthesia including but not limited to:  - adverse reactions to medications - damage to eyes, teeth, lips or other oral mucosa - nerve damage due to positioning  - sore throat or hoarseness - Damage to heart, brain, nerves, lungs, other parts of body or loss of life  Patient voiced understanding and assent.)         Anesthesia Quick Evaluation

## 2023-09-05 NOTE — Discharge Instructions (Signed)

## 2023-09-11 ENCOUNTER — Other Ambulatory Visit: Payer: Self-pay

## 2023-09-11 ENCOUNTER — Ambulatory Visit: Payer: Self-pay | Admitting: Anesthesiology

## 2023-09-11 ENCOUNTER — Ambulatory Visit
Admission: RE | Admit: 2023-09-11 | Discharge: 2023-09-11 | Disposition: A | Discharge status: Home / Self Care | Attending: Ophthalmology | Admitting: Ophthalmology

## 2023-09-11 ENCOUNTER — Encounter
Admission: RE | Disposition: A | Discharge status: Home / Self Care | Payer: Self-pay | Source: Home / Self Care | Attending: Ophthalmology

## 2023-09-11 DIAGNOSIS — I10 Essential (primary) hypertension: Secondary | ICD-10-CM | POA: Diagnosis not present

## 2023-09-11 DIAGNOSIS — Z853 Personal history of malignant neoplasm of breast: Secondary | ICD-10-CM | POA: Insufficient documentation

## 2023-09-11 DIAGNOSIS — N183 Chronic kidney disease, stage 3 unspecified: Secondary | ICD-10-CM | POA: Diagnosis not present

## 2023-09-11 DIAGNOSIS — Z923 Personal history of irradiation: Secondary | ICD-10-CM | POA: Diagnosis not present

## 2023-09-11 DIAGNOSIS — I129 Hypertensive chronic kidney disease with stage 1 through stage 4 chronic kidney disease, or unspecified chronic kidney disease: Secondary | ICD-10-CM | POA: Insufficient documentation

## 2023-09-11 DIAGNOSIS — H2512 Age-related nuclear cataract, left eye: Secondary | ICD-10-CM | POA: Insufficient documentation

## 2023-09-11 HISTORY — PX: CATARACT EXTRACTION W/PHACO: SHX586

## 2023-09-11 SURGERY — PHACOEMULSIFICATION, CATARACT, WITH IOL INSERTION
Anesthesia: Monitor Anesthesia Care | Laterality: Left

## 2023-09-11 MED ORDER — LACTATED RINGERS IV SOLN: INTRAVENOUS | Status: DC

## 2023-09-11 MED ORDER — SIGHTPATH DOSE#1 BSS IO SOLN
INTRAOCULAR | Status: DC | PRN
  Administered 2023-09-11 (×2): 60 mL via OPHTHALMIC

## 2023-09-11 MED ORDER — BRIMONIDINE TARTRATE-TIMOLOL 0.2-0.5 % OP SOLN
OPHTHALMIC | Status: DC | PRN
Start: 2023-09-11 — End: 2023-09-11
  Administered 2023-09-11 (×2): 1 [drp] via OPHTHALMIC

## 2023-09-11 MED ORDER — SIGHTPATH DOSE#1 NA HYALUR & NA CHOND-NA HYALUR IO KIT
PACK | INTRAOCULAR | Status: DC | PRN
  Administered 2023-09-11 (×2): 1 via OPHTHALMIC

## 2023-09-11 MED ORDER — SIGHTPATH DOSE#1 BSS IO SOLN
INTRAOCULAR | Status: DC | PRN
  Administered 2023-09-11 (×2): 15 mL via INTRAOCULAR

## 2023-09-11 MED ORDER — ARMC OPHTHALMIC DILATING DROPS
1.0000 | OPHTHALMIC | Status: DC | PRN
  Administered 2023-09-11 (×6): 1 via OPHTHALMIC

## 2023-09-11 MED ORDER — TETRACAINE HCL 0.5 % OP SOLN
OPHTHALMIC | Status: AC
  Filled 2023-09-11: qty 4

## 2023-09-11 MED ORDER — FENTANYL CITRATE (PF) 100 MCG/2ML IJ SOLN
INTRAMUSCULAR | Status: AC
  Filled 2023-09-11: qty 2

## 2023-09-11 MED ORDER — LIDOCAINE HCL (PF) 2 % IJ SOLN
INTRAOCULAR | Status: DC | PRN
  Administered 2023-09-11 (×2): 1 mL

## 2023-09-11 MED ORDER — FENTANYL CITRATE (PF) 100 MCG/2ML IJ SOLN
INTRAMUSCULAR | Status: DC | PRN
  Administered 2023-09-11 (×4): 50 ug via INTRAVENOUS

## 2023-09-11 MED ORDER — ARMC OPHTHALMIC DILATING DROPS
OPHTHALMIC | Status: AC
  Filled 2023-09-11: qty 0.5

## 2023-09-11 MED ORDER — MOXIFLOXACIN HCL 0.5 % OP SOLN
OPHTHALMIC | Status: DC | PRN
  Administered 2023-09-11 (×2): .2 mL via OPHTHALMIC

## 2023-09-11 MED ORDER — TETRACAINE HCL 0.5 % OP SOLN
1.0000 [drp] | OPHTHALMIC | Status: DC | PRN
  Administered 2023-09-11 (×6): 1 [drp] via OPHTHALMIC

## 2023-09-11 SURGICAL SUPPLY — 9 items
FEE CATARACT SUITE SIGHTPATH (MISCELLANEOUS) ×1 IMPLANT
GLOVE BIOGEL PI IND STRL 8 (GLOVE) ×1 IMPLANT
GLOVE SURG LX STRL 7.5 STRW (GLOVE) ×1 IMPLANT
GLOVE SURG SYN 6.5 PF PI BL (GLOVE) ×1 IMPLANT
LENS IOL CLRN VT TRC 3 20.0 IMPLANT
NDL FILTER BLUNT 18X1 1/2 (NEEDLE) ×1 IMPLANT
NEEDLE FILTER BLUNT 18X1 1/2 (NEEDLE) ×1 IMPLANT
RING MALYGIN 7.0 (MISCELLANEOUS) IMPLANT
SYR 3ML LL SCALE MARK (SYRINGE) ×1 IMPLANT

## 2023-09-11 NOTE — Anesthesia Postprocedure Evaluation (Signed)
 Anesthesia Post Note  Patient: Angela Bullock  Procedure(s) Performed: PHACOEMULSIFICATION, CATARACT, WITH IOL INSERTION 6.65, 00:36.8 (Left)  Patient location during evaluation: PACU Anesthesia Type: MAC Level of consciousness: awake and alert Pain management: pain level controlled Vital Signs Assessment: post-procedure vital signs reviewed and stable Respiratory status: spontaneous breathing, nonlabored ventilation, respiratory function stable and patient connected to nasal cannula oxygen Cardiovascular status: stable and blood pressure returned to baseline Postop Assessment: no apparent nausea or vomiting Anesthetic complications: no   No notable events documented.   Last Vitals:  Vitals:   09/11/23 1024 09/11/23 1030  BP: 131/77 138/68  Pulse: 96 92  Resp: 11 13  Temp: 36.9 C 36.9 C  SpO2: 98% 97%    Last Pain:  Vitals:   09/11/23 1030  TempSrc:   PainSc: 0-No pain                 [name] C Quandre Polinski

## 2023-09-11 NOTE — H&P (Signed)
 [facility]   Primary Care Physician:  Edman Marsa PARAS, DO Ophthalmologist: Dr. Dene Etienne  Pre-Procedure History & Physical: HPI:  Angela Bullock is a 88 y.o. female here for ophthalmic surgery.   Past Medical History:  Diagnosis Date   Allergy    Arthritis    Atherosclerosis of aorta (HCC)    Benign hypertension with CKD (chronic kidney disease) stage III (HCC)    Breast cancer (HCC) 01/30/2012   left, radiation and lumpectomy   GERD (gastroesophageal reflux disease)    Heart murmur    Hypertension    Large hiatal hernia    Personal history of radiation therapy     Past Surgical History:  Procedure Laterality Date   ABDOMINAL HYSTERECTOMY     BREAST BIOPSY Left 01/30/2011   negative   BREAST BIOPSY Left 01/30/2012   positive   BREAST CYST ASPIRATION Left    BREAST LUMPECTOMY Left 04/02/2012   positive   CATARACT EXTRACTION W/PHACO Right 08/21/2023   Procedure: PHACOEMULSIFICATION, CATARACT, WITH IOL INSERTION 11.15 00:50.0;  Surgeon: Etienne Dene, MD;  Location: [facility];  Service: Ophthalmology;  Laterality: Right;   COLONOSCOPY WITH PROPOFOL  N/A 07/23/2017   Procedure: COLONOSCOPY WITH PROPOFOL  WITH BIOPSIES;  Surgeon: Therisa Bi, MD;  Location: [facility] ENDOSCOPY;  Service: Gastroenterology;  Laterality: N/A;   MOHS SURGERY  02/28/2021    Prior to Admission medications   Medication Sig Start Date End Date Taking? Authorizing Provider  atorvastatin  (LIPITOR) 10 MG tablet Take 1 tablet (10 mg total) by mouth daily. 07/18/23  Yes Karamalegos, Marsa PARAS, DO  Calcium  Carbonate-Vitamin D  600-200 MG-UNIT TABS Take by mouth 3 (three) times a week.   Yes [provider]  cephALEXin (KEFLEX) 500 MG capsule Take 500 mg by mouth 4 (four) times daily.   Yes [provider]  cholecalciferol (VITAMIN D ) 1000 UNITS tablet Take 1,000 Units by mouth daily.   Yes [provider]  co-enzyme Q-10 50 MG capsule Take 50  mg by mouth daily.   Yes [provider]  diphenhydramine-acetaminophen  (TYLENOL  PM) 25-500 MG TABS tablet Take 1 tablet by mouth at bedtime.   Yes [provider]  levothyroxine  (SYNTHROID ) 25 MCG tablet Take 1 tablet (25 mcg total) by mouth daily before breakfast. 07/18/23  Yes Karamalegos, Marsa PARAS, DO  lisinopril  (ZESTRIL ) 40 MG tablet Take 1 tablet (40 mg total) by mouth daily. 07/18/23  Yes Karamalegos, Marsa PARAS, DO  minoxidil (LONITEN) 2.5 MG tablet Take 1.25 mg by mouth daily. 12/31/22  Yes [provider]  Multiple Vitamins-Minerals (CENTRUM SILVER PO) Take 1 tablet by mouth daily.   Yes [provider]  Omega-3 Krill Oil 300 MG CAPS Take 1 capsule by mouth daily.   Yes [provider]  Resveratrol 250 MG CAPS  05/20/99  Yes [provider]  traMADol  (ULTRAM ) 50 MG tablet Take 1 tablet (50 mg total) by mouth every 8 (eight) hours as needed for moderate pain (pain score 4-6) or severe pain (pain score 7-10). 07/18/23  Yes Karamalegos, Marsa PARAS, DO  calcium  carbonate (OSCAL) 1500 (600 CA) MG TABS tablet Take 1 tablet by mouth every other day. Patient not taking: Reported on 08/12/2023    [provider]    Allergies as of 07/18/2023 - Review Complete 07/18/2023  Allergen Reaction Noted   Penicillins Rash 10/28/2015    Family History  Problem Relation Age of Onset   Breast cancer Paternal Aunt 100   Osteoporosis Mother  Heart attack Father     Social History   Socioeconomic History   Marital status: Widowed    Spouse name: Not on file   Number of children: 1   Years of education: 53   Highest education level: Associate degree: occupational, Scientist, product/process development, or vocational program  Occupational History   Not on file  Tobacco Use   Smoking status: Never   Smokeless tobacco: Never  Vaping Use   Vaping status: Never Used  Substance and Sexual Activity   Alcohol use: No    Alcohol/week: 0.0 standard drinks of  alcohol   Drug use: No   Sexual activity: Never  Other Topics Concern   Not on file  Social History Narrative   Not on file   Social Drivers of Health   Financial Resource Strain: Low Risk  (07/14/2023)   Overall Financial Resource Strain (CARDIA)    Difficulty of Paying Living Expenses: Not hard at all  Food Insecurity: No Food Insecurity (07/14/2023)   Hunger Vital Sign    Worried About Running Out of Food in the Last Year: Never true    Ran Out of Food in the Last Year: Never true  Transportation Needs: No Transportation Needs (07/14/2023)   PRAPARE - Administrator, Civil Service (Medical): No    Lack of Transportation (Non-Medical): No  Physical Activity: Insufficiently Active (07/14/2023)   Exercise Vital Sign    Days of Exercise per Week: 5 days    Minutes of Exercise per Session: 20 min  Stress: No Stress Concern Present (07/14/2023)   [employer] of Occupational Health - Occupational Stress Questionnaire    Feeling of Stress: Not at all  Social Connections: Socially Isolated (07/14/2023)   Social Connection and Isolation Panel    Frequency of Communication with Friends and Family: Once a week    Frequency of Social Gatherings with Friends and Family: More than three times a week    Attends Religious Services: Patient declined    Database administrator or Organizations: No    Attends Banker Meetings: Not on file    Marital Status: Widowed  Intimate Partner Violence: Not At Risk (09/14/2022)   Humiliation, Afraid, Rape, and Kick questionnaire    Fear of Current or Ex-Partner: No    Emotionally Abused: No    Physically Abused: No    Sexually Abused: No    Review of Systems: See HPI, otherwise negative ROS  Physical Exam: BP (!) 154/98   Pulse 98   Temp 98.3 F (36.8 C) (Temporal)   Resp 18   Ht 5' 4 (1.626 m)   Wt 78 kg   SpO2 98%   BMI 29.52 kg/m  General:   Alert,  pleasant and cooperative in NAD Head:  Normocephalic and  atraumatic. Lungs:  Clear to auscultation.    Heart:  Regular rate and rhythm.   Impression/Plan: Angela Bullock is here for ophthalmic surgery.  Risks, benefits, limitations, and alternatives regarding ophthalmic surgery have been reviewed with the patient.  Questions have been answered.  All parties agreeable.   MITTIE GASKIN, MD  09/11/2023, 9:35 AM

## 2023-09-11 NOTE — Op Note (Signed)
 LOCATION:  [facility]   PREOPERATIVE DIAGNOSIS:  Nuclear sclerotic cataract of the left eye.  H25.12  POSTOPERATIVE DIAGNOSIS:  Nuclear sclerotic cataract of the left eye with miotic pupil   PROCEDURE:  Phacoemulsification with Toric posterior chamber intraocular lens placement of the left eye with Malyugin ring.  Ultrasound time: Procedure(s): PHACOEMULSIFICATION, CATARACT, WITH IOL INSERTION 6.65, 00:36.8 (Left)  LENS:   Implant Name Type Inv. Item Serial No. Manufacturer Lot No. LRB No. Used Action  LENS IOL CLRN VT TRC 3 20.0 - [id number]  LENS IOL CLRN VT TRC 3 20.0 [id number] [employer]IGHTPATH  Left 1 Implanted     CNWET3 20.0 Vivity Toric intraocular lens with 1.5 diopters of cylindrical power with axis orientation at 12 degrees.     SURGEON:  Dene FABIENE Etienne, MD   ANESTHESIA:  Topical with tetracaine  drops and 2% Xylocaine  jelly, augmented with 1% preservative-free intracameral lidocaine .  COMPLICATIONS:  None.   DESCRIPTION OF PROCEDURE:  The patient was identified in the holding room and transported to the operating suite and placed in the supine position under the operating microscope.  The left eye was identified as the operative eye, and it was prepped and draped in the usual sterile ophthalmic fashion.    A clear-corneal paracentesis incision was made at the 1:30 position.  0.5 ml of preservative-free 1% lidocaine  was injected into the anterior chamber. The anterior chamber was filled with Viscoat. A 7.0 mm Malyugin ring was placed. A 2.4 millimeter near clear corneal incision was then made at the 10:30 position.  A cystotome and capsulorrhexis forceps were then used to make a curvilinear capsulorrhexis.  Hydrodissection and hydrodelineation were then performed using balanced salt solution.   Phacoemulsification was then used in stop and chop fashion to remove the lens, nucleus and epinucleus.  The remaining cortex was aspirated using the irrigation and  aspiration handpiece.  Provisc viscoelastic was then placed into the capsular bag to distend it for lens placement.  The Verion digital marker was used to align the implant at the intended axis.   A Toric lens was then injected into the capsular bag.  It was rotated clockwise until the axis marks on the lens were approximately 15 degrees in the counterclockwise direction to the intended alignment.  The Malyugin ring was removed. The viscoelastic was aspirated from the eye using the irrigation aspiration handpiece.  Then, a Koch spatula through the sideport incision was used to rotate the lens in a clockwise direction until the axis markings of the intraocular lens were lined up with the Verion alignment.  Balanced salt solution was then used to hydrate the wounds. Vigamox  0.2 ml of a 1mg  per ml solution was injected into the anterior chamber for a dose of 0.2 mg of intracameral antibiotic at the completion of the case.    The eye was noted to have a physiologic pressure and there was no wound leak noted.   Timolol  and Brimonidine  drops were applied to the eye.  The patient was taken to the recovery room in stable condition having had no complications of anesthesia or surgery.  Iasha Mccalister 09/11/2023, 10:22 AM

## 2023-09-11 NOTE — Transfer of Care (Signed)
 Immediate Anesthesia Transfer of Care Note  Patient: Angela Bullock  Procedure(s) Performed: PHACOEMULSIFICATION, CATARACT, WITH IOL INSERTION 6.65, 00:36.8 (Left)  Patient Location: PACU  Anesthesia Type: MAC  Level of Consciousness: awake, alert  and patient cooperative  Airway and Oxygen Therapy: Patient Spontanous Breathing and Patient connected to supplemental oxygen  Post-op Assessment: Post-op Vital signs reviewed, Patient's Cardiovascular Status Stable, Respiratory Function Stable, Patent Airway and No signs of Nausea or vomiting  Post-op Vital Signs: Reviewed and stable  Complications: No notable events documented.

## 2023-09-12 ENCOUNTER — Encounter: Payer: Self-pay | Admitting: Ophthalmology

## 2023-09-19 ENCOUNTER — Ambulatory Visit: Payer: Medicare Other

## 2023-09-19 VITALS — Ht 64.0 in | Wt 172.0 lb

## 2023-09-19 DIAGNOSIS — Z0000 Encounter for general adult medical examination without abnormal findings: Secondary | ICD-10-CM | POA: Diagnosis not present

## 2023-09-19 NOTE — Progress Notes (Signed)
 Subjective:   Angela Bullock is a 88 y.o. who presents for a Medicare Wellness preventive visit.  As a reminder, Annual Wellness Visits don't include a physical exam, and some assessments may be limited, especially if this visit is performed virtually. We may recommend an in-person follow-up visit with your provider if needed.  Visit Complete: Virtual I connected with  Angela Bullock on 09/19/23 by a audio enabled telemedicine application and verified that I am speaking with the correct person using two identifiers.  Patient Location: Home  Provider Location: Home Office  I discussed the limitations of evaluation and management by telemedicine. The patient expressed understanding and agreed to proceed.  Vital Signs: Because this visit was a virtual/telehealth visit, some criteria may be missing or patient reported. Any vitals not documented were not able to be obtained and vitals that have been documented are patient reported.    Persons Participating in Visit: Patient.  AWV Questionnaire: No: Patient Medicare AWV questionnaire was not completed prior to this visit.  Cardiac Risk Factors include: advanced age (>45men, >68 women);hypertension     Objective:    Today's Vitals   09/19/23 1409  Weight: 172 lb (78 kg)  Height: 5' 4 (1.626 m)  PainSc: 0-No pain   Body mass index is 29.52 kg/m.     09/19/2023    2:15 PM 09/11/2023    9:33 AM 08/21/2023    8:36 AM 09/14/2022    1:13 PM 12/08/2019   10:48 AM 06/21/2019    6:46 PM 11/18/2018    9:46 AM  Advanced Directives  Does Patient Have a Medical Advance Directive? No Yes Yes Yes Yes No Yes  Type of Special educational needs teacher of [place];Living will Living will;Healthcare Power of [employer] Power of [place];Living will Healthcare Power of Attorney  Living will;Healthcare Power of Attorney  Does patient want to make changes to medical advance directive?  No - Patient declined       Copy of Healthcare Power  of Attorney in Chart?  No - copy requested No - copy requested No - copy requested No - copy requested  No - copy requested  Would patient like information on creating a medical advance directive? No - Patient declined     No - Patient declined     Current Medications (verified) Outpatient Encounter Medications as of 09/19/2023  Medication Sig   atorvastatin  (LIPITOR) 10 MG tablet Take 1 tablet (10 mg total) by mouth daily.   calcium  carbonate (OSCAL) 1500 (600 CA) MG TABS tablet Take 1 tablet by mouth every other day. (Patient not taking: Reported on 08/12/2023)   Calcium  Carbonate-Vitamin D  600-200 MG-UNIT TABS Take by mouth 3 (three) times a week.   cephALEXin (KEFLEX) 500 MG capsule Take 500 mg by mouth 4 (four) times daily.   cholecalciferol (VITAMIN D ) 1000 UNITS tablet Take 1,000 Units by mouth daily.   co-enzyme Q-10 50 MG capsule Take 50 mg by mouth daily.   diphenhydramine-acetaminophen  (TYLENOL  PM) 25-500 MG TABS tablet Take 1 tablet by mouth at bedtime.   levothyroxine  (SYNTHROID ) 25 MCG tablet Take 1 tablet (25 mcg total) by mouth daily before breakfast.   lisinopril  (ZESTRIL ) 40 MG tablet Take 1 tablet (40 mg total) by mouth daily.   minoxidil (LONITEN) 2.5 MG tablet Take 1.25 mg by mouth daily.   Multiple Vitamins-Minerals (CENTRUM SILVER PO) Take 1 tablet by mouth daily.   Omega-3 Krill Oil 300 MG CAPS Take 1 capsule by mouth daily.  Resveratrol 250 MG CAPS    traMADol  (ULTRAM ) 50 MG tablet Take 1 tablet (50 mg total) by mouth every 8 (eight) hours as needed for moderate pain (pain score 4-6) or severe pain (pain score 7-10).   No facility-administered encounter medications on file as of 09/19/2023.    Allergies (verified) Penicillins   History: Past Medical History:  Diagnosis Date   Allergy    Arthritis    Atherosclerosis of aorta (HCC)    Benign hypertension with CKD (chronic kidney disease) stage III (HCC)    Breast cancer (HCC) 01/30/2012   left, radiation and  lumpectomy   GERD (gastroesophageal reflux disease)    Heart murmur    Hypertension    Large hiatal hernia    Personal history of radiation therapy    Past Surgical History:  Procedure Laterality Date   ABDOMINAL HYSTERECTOMY     BREAST BIOPSY Left 01/30/2011   negative   BREAST BIOPSY Left 01/30/2012   positive   BREAST CYST ASPIRATION Left    BREAST LUMPECTOMY Left 04/02/2012   positive   CATARACT EXTRACTION W/PHACO Right 08/21/2023   Procedure: PHACOEMULSIFICATION, CATARACT, WITH IOL INSERTION 11.15 00:50.0;  Surgeon: Mittie Gaskin, MD;  Location[facility];  Service: Ophthalmology;  Laterality: Right;   CATARACT EXTRACTION W/PHACO Left 09/11/2023   Procedure: PHACOEMULSIFICATION, CATARACT, WITH IOL INSERTION 6.65, 00:36.8;  Surgeon: Mittie Gaskin, MD;  Location: [facility];  Service: Ophthalmology;  Laterality: Left;   COLONOSCOPY WITH PROPOFOL  N/A 07/23/2017   Procedure: COLONOSCOPY WITH PROPOFOL  WITH BIOPSIES;  Surgeon: Therisa Bi, MD;  Location[facility];  Service: Gastroenterology;  Laterality: N/A;   MOHS SURGERY  02/28/2021   Family History  Problem Relation Age of Onset   Breast cancer Paternal Aunt 38   Osteoporosis Mother    Heart attack Father    Social History   Socioeconomic History   Marital status: Widowed    Spouse name: Not on file   Number of children: 1   Years of education: 28   Highest education level: Associate degree: occupational, Scientist, product/process development, or vocational program  Occupational History   Not on file  Tobacco Use   Smoking status: Never   Smokeless tobacco: Never  Vaping Use   Vaping status: Never Used  Substance and Sexual Activity   Alcohol use: No    Alcohol/week: 0.0 standard drinks of alcohol   Drug use: No   Sexual activity: Never  Other Topics Concern   Not on file  Social History Narrative   Not on file   Social Drivers of Health   Financial Resource Strain: Low Risk  (09/19/2023)    Overall Financial Resource Strain (CARDIA)    Difficulty of Paying Living Expenses: Not hard at all  Food Insecurity: No Food Insecurity (09/19/2023)   Hunger Vital Sign    Worried About Running Out of Food in the Last Year: Never true    Ran Out of Food in the Last Year: Never true  Transportation Needs: No Transportation Needs (09/19/2023)   PRAPARE - Administrator, Civil Service (Medical): No    Lack of Transportation (Non-Medical): No  Physical Activity: Insufficiently Active (09/19/2023)   Exercise Vital Sign    Days of Exercise per Week: 7 days    Minutes of Exercise per Session: 20 min  Stress: No Stress Concern Present (09/19/2023)   [employer] of Occupational Health - Occupational Stress Questionnaire    Feeling of Stress: Not at all  Social Connections:  Socially Isolated (09/19/2023)   Social Connection and Isolation Panel    Frequency of Communication with Friends and Family: More than three times a week    Frequency of Social Gatherings with Friends and Family: More than three times a week    Attends Religious Services: Never    Database administrator or Organizations: No    Attends Banker Meetings: Never    Marital Status: Widowed    Tobacco Counseling Counseling given: Not Answered    Clinical Intake:  Pre-visit preparation completed: Yes  Pain : No/denies pain Pain Score: 0-No pain     BMI - recorded: 29.52 Nutritional Status: BMI 25 -29 Overweight Nutritional Risks: None Diabetes: No  Lab Results  Component Value Date   HGBA1C 5.5 07/11/2023   HGBA1C 5.6 07/04/2022   HGBA1C 5.3 06/16/2021     How often do you need to have someone help you when you read instructions, pamphlets, or other written materials from your doctor or pharmacy?: 1 - Never  Interpreter Needed?: No  Information entered by :: Rojelio Blush LPN   Activities of Daily Living     09/19/2023    2:14 PM 09/11/2023    9:26 AM  In your present state  of health, do you have any difficulty performing the following activities:  Hearing? 0 0  Vision? 0 0  Difficulty concentrating or making decisions? 0 0  Walking or climbing stairs? 1   Comment Uses a Walker   Dressing or bathing? 0   Doing errands, shopping? 0   Preparing Food and eating ? N   Using the Toilet? N   In the past six months, have you accidently leaked urine? N   Do you have problems with loss of bowel control? N   Managing your Medications? N   Managing your Finances? N   Housekeeping or managing your Housekeeping? N     Patient Care Team: Edman Marsa PARAS, DO as PCP - General (Family Medicine) Corcoran, Melissa C, MD (Inactive) as Referring Physician (Hematology and Oncology)  I have updated your Care Teams any recent Medical Services you may have received from other providers in the past year.     Assessment:   This is a routine wellness examination for Angela Bullock.  Hearing/Vision screen Hearing Screening - Comments:: Denies hearing difficulties   Vision Screening - Comments:: Wears rx glasses - up to date with routine eye exams with  [facility]   Goals Addressed               This Visit's Progress     Increase physical activity (pt-stated)        Remain active.       Depression Screen     09/19/2023    2:13 PM 07/18/2023    9:00 AM 01/17/2023   10:57 AM 09/14/2022    1:11 PM 07/12/2022   11:26 AM 01/02/2022    1:13 PM 06/19/2021   11:00 AM  PHQ 2/9 Scores  PHQ - 2 Score 0 0 0 0 1 1 0  PHQ- 9 Score  2 1  4 3  0    Fall Risk     09/19/2023    2:14 PM 07/18/2023    9:00 AM 09/14/2022    1:12 PM 07/12/2022   11:28 AM 01/02/2022    1:13 PM  Fall Risk   Falls in the past year? 0 0 0 1 1  Number falls in past yr: 0  0  0 1  Injury with Fall? 0  0 0 0  Risk for fall due to : No Fall Risks No Fall Risks No Fall Risks Impaired balance/gait History of fall(s)  Follow up Falls evaluation completed  Falls prevention discussed Falls evaluation  completed Falls evaluation completed      Data saved with a previous flowsheet row definition    MEDICARE RISK AT HOME:  Medicare Risk at Home Any stairs in or around the home?: No If so, are there any without handrails?: No Home free of loose throw rugs in walkways, pet beds, electrical cords, etc?: Yes Adequate lighting in your home to reduce risk of falls?: Yes Life alert?: No Use of a cane, walker or w/c?: No Grab bars in the bathroom?: Yes Shower chair or bench in shower?: Yes Elevated toilet seat or a handicapped toilet?: Yes  TIMED UP AND GO:  Was the test performed?  No  Cognitive Function: 6CIT completed        09/19/2023    2:15 PM 09/14/2022    1:13 PM 03/27/2021    1:24 PM 12/08/2019   10:52 AM 11/18/2018    9:47 AM  6CIT Screen  What Year? 0 points 0 points 0 points 0 points 0 points  What month? 0 points 0 points 0 points 0 points 0 points  What time? 0 points 0 points 0 points 0 points 0 points  Count back from 20 0 points 0 points 0 points 0 points 0 points  Months in reverse 0 points 0 points 0 points 0 points 0 points  Repeat phrase 0 points 0 points 0 points 0 points 0 points  Total Score 0 points 0 points 0 points 0 points 0 points    Immunizations Immunization History  Administered Date(s) Administered    sv, Bivalent, Protein Subunit Rsvpref,pf (Abrysvo) 11/01/2021   Fluad Quad(high Dose 65+) 10/02/2018, 11/01/2021   Influenza, High Dose Seasonal PF 10/22/2014, 11/07/2017, 11/01/2021   Influenza, Seasonal, Injecte, Preservative Fre 10/24/2012   Influenza-Unspecified 10/24/2015, 10/29/2019, 10/19/2020, 11/14/2022   Moderna SARS-COV2 Booster Vaccination 12/28/2021   PFIZER(Purple Top)SARS-COV-2 Vaccination 02/12/2019, 03/05/2019, 11/04/2019   PNEUMOCOCCAL CONJUGATE-20 06/19/2021   PPD Test 06/16/2019   Pneumococcal Conjugate-13 01/14/2013   Tdap 05/21/2013   Zoster Recombinant(Shingrix) 06/05/2021, 06/05/2021, 09/11/2021   Zoster, Live  01/30/2012    Screening Tests Health Maintenance  Topic Date Due   COVID-19 Vaccine (4 - 2024-25 season) 09/30/2022   INFLUENZA VACCINE  08/30/2023   DTaP/Tdap/Td (2 - Td or Tdap) 07/17/2024 (Originally 05/22/2023)   Medicare Annual Wellness (AWV)  09/18/2024   Pneumococcal Vaccine: 50+ Years  Completed   DEXA SCAN  Completed   Zoster Vaccines- Shingrix  Completed   HPV VACCINES  Aged Out   Meningococcal B Vaccine  Aged Out    Health Maintenance  Health Maintenance Due  Topic Date Due   COVID-19 Vaccine (4 - 2024-25 season) 09/30/2022   INFLUENZA VACCINE  08/30/2023   Health Maintenance Items Addressed:   Additional Screening:  Vision Screening: Recommended annual ophthalmology exams for early detection of glaucoma and other disorders of the eye. Would you like a referral to an eye doctor? No    Dental Screening: Recommended annual dental exams for proper oral hygiene  Community Resource Referral / Chronic Care Management: CRR required this visit?  No   CCM required this visit?  No   Plan:    I have personally reviewed and noted the following in the patient's chart:   Medical  and social history Use of alcohol, tobacco or illicit drugs  Current medications and supplements including opioid prescriptions. Patient is currently taking opioid prescriptions. Information provided to patient regarding non-opioid alternatives. Patient advised to discuss non-opioid treatment plan with their provider. Functional ability and status Nutritional status Physical activity Advanced directives List of other physicians Hospitalizations, surgeries, and ER visits in previous 12 months Vitals Screenings to include cognitive, depression, and falls Referrals and appointments  In addition, I have reviewed and discussed with patient certain preventive protocols, quality metrics, and best practice recommendations. A written personalized care plan for preventive services as well as  general preventive health recommendations were provided to patient.   Rojelio LELON Blush, LPN   [id number]   After Visit Summary: (MyChart) Due to this being a telephonic visit, the after visit summary with patients personalized plan was offered to patient via MyChart   Notes: Nothing significant to report at this time.

## 2023-09-19 NOTE — Patient Instructions (Signed)
 Ms. Whetstone , Thank you for taking time out of your busy schedule to complete your Annual Wellness Visit with me. I enjoyed our conversation and look forward to speaking with you again next year. I, as well as your care team,  appreciate your ongoing commitment to your health goals. Please review the following plan we discussed and let me know if I can assist you in the future. Your Game plan/ To Do List    Referrals: If you haven't heard from the office you've been referred to, please reach out to them at the phone provided.   Follow up Visits: We will see or speak with you next year for your Next Medicare AWV with our clinical staff 10/02/24 @ 3:20p  Have you seen your provider in the last 6 months (3 months if uncontrolled diabetes)? Next appointment with provider 01/20/24 @ 1:20p  Clinician Recommendations:  Aim for 30 minutes of exercise or brisk walking, 6-8 glasses of water, and 5 servings of fruits and vegetables each day.       This is a list of the screenings recommended for you:  Health Maintenance  Topic Date Due   COVID-19 Vaccine (4 - 2024-25 season) 09/30/2022   Flu Shot  08/30/2023   DTaP/Tdap/Td vaccine (2 - Td or Tdap) 07/17/2024*   Medicare Annual Wellness Visit  09/18/2024   Pneumococcal Vaccine for age over 25  Completed   DEXA scan (bone density measurement)  Completed   Zoster (Shingles) Vaccine  Completed   HPV Vaccine  Aged Out   Meningitis B Vaccine  Aged Out  *Topic was postponed. The date shown is not the original due date.  Opioid Pain Medicine Management Opioids are powerful medicines that are used to treat moderate to severe pain. When used for short periods of time, they can help you to: Sleep better. Do better in physical or occupational therapy. Feel better in the first few days after an injury. Recover from surgery. Opioids should be taken with the supervision of a trained health care provider. They should be taken for the shortest period of time  possible. This is because opioids can be addictive, and the longer you take opioids, the greater your risk of addiction. This addiction can also be called opioid use disorder. What are the risks? Using opioid pain medicines for longer than 3 days increases your risk of side effects. Side effects include: Constipation. Nausea and vomiting. Breathing difficulties (respiratory depression). Drowsiness. Confusion. Opioid use disorder. Itching. Taking opioid pain medicine for a long period of time can affect your ability to do daily tasks. It also puts you at risk for: Motor vehicle crashes. Depression. Suicide. Heart attack. Overdose, which can be life-threatening. What is a pain treatment plan? A pain treatment plan is an agreement between you and your health care provider. Pain is unique to each person, and treatments vary depending on your condition. To manage your pain, you and your health care provider need to work together. To help you do this: Discuss the goals of your treatment, including how much pain you might expect to have and how you will manage the pain. Review the risks and benefits of taking opioid medicines. Remember that a good treatment plan uses more than one approach and minimizes the chance of side effects. Be honest about the amount of medicines you take and about any drug or alcohol use. Get pain medicine prescriptions from only one health care provider. Pain can be managed with many types of alternative treatments.  Ask your health care provider to refer you to one or more specialists who can help you manage pain through: Physical or occupational therapy. Counseling (cognitive behavioral therapy). Good nutrition. Biofeedback. Massage. Meditation. Non-opioid medicine. Following a gentle exercise program. How to use opioid pain medicine Taking medicine Take your pain medicine exactly as told by your health care provider. Take it only when you need it. If your pain  gets less severe, you may take less than your prescribed dose if your health care provider approves. If you are not having pain, do nottake pain medicine unless your health care provider tells you to take it. If your pain is severe, do nottry to treat it yourself by taking more pills than instructed on your prescription. Contact your health care provider for help. Write down the times when you take your pain medicine. It is easy to become confused while on pain medicine. Writing the time can help you avoid overdose. Take other over-the-counter or prescription medicines only as told by your health care provider. Keeping yourself and others safe  While you are taking opioid pain medicine: Do not drive, use machinery, or power tools. Do not sign legal documents. Do not drink alcohol. Do not take sleeping pills. Do not supervise children by yourself. Do not do activities that require climbing or being in high places. Do not go to a lake, river, ocean, spa, or swimming pool. Do not share your pain medicine with anyone. Keep pain medicine in a locked cabinet or in a secure area where pets and children cannot reach it. Stopping your use of opioids If you have been taking opioid medicine for more than a few weeks, you may need to slowly decrease (taper) how much you take until you stop completely. Tapering your use of opioids can decrease your risk of symptoms of withdrawal, such as: Pain and cramping in the abdomen. Nausea. Sweating. Sleepiness. Restlessness. Uncontrollable shaking (tremors). Cravings for the medicine. Do not attempt to taper your use of opioids on your own. Talk with your health care provider about how to do this. Your health care provider may prescribe a step-down schedule based on how much medicine you are taking and how long you have been taking it. Getting rid of leftover pills Do not save any leftover pills. Get rid of leftover pills safely by: Taking the medicine to a  prescription take-back program. This is usually offered by the county or law enforcement. Bringing them to a pharmacy that has a drug disposal container. Flushing them down the toilet. Check the label or package insert of your medicine to see whether this is safe to do. Throwing them out in the trash. Check the label or package insert of your medicine to see whether this is safe to do. If it is safe to throw it out, remove the medicine from the original container, put it into a sealable bag or container, and mix it with used coffee grounds, food scraps, dirt, or cat litter before putting it in the trash. Follow these instructions at home: Activity Do exercises as told by your health care provider. Avoid activities that make your pain worse. Return to your normal activities as told by your health care provider. Ask your health care provider what activities are safe for you. General instructions You may need to take these actions to prevent or treat constipation: Drink enough fluid to keep your urine pale yellow. Take over-the-counter or prescription medicines. Eat foods that are high in fiber, such as beans,  whole grains, and fresh fruits and vegetables. Limit foods that are high in fat and processed sugars, such as fried or sweet foods. Keep all follow-up visits. This is important. Where to find support If you have been taking opioids for a long time, you may benefit from receiving support for quitting from a local support group or counselor. Ask your health care provider for a referral to these resources in your area. Where to find more information Centers for Disease Control and Prevention (CDC): FootballExhibition.com.br [place] Food and Drug Administration (FDA): PumpkinSearch.com.ee Get help right away if: You may have taken too much of an opioid (overdosed). Common symptoms of an overdose: Your breathing is slower or more shallow than normal. You have a very slow heartbeat (pulse). You have slurred  speech. You have nausea and vomiting. Your pupils become very small. You have other potential symptoms: You are very confused. You faint or feel like you will faint. You have cold, clammy skin. You have blue lips or fingernails. You have thoughts of harming yourself or harming others. These symptoms may represent a serious problem that is an emergency. Do not wait to see if the symptoms will go away. Get medical help right away. Call your local emergency services (911 in the [place] Do not drive yourself to the hospital.  If you ever feel like you may hurt yourself or others, or have thoughts about taking your own life, get help right away. Go to your nearest emergency department or: Call your local emergency services (911 in the [place] Call the [facility] (908-739-7247 in the [place] Call a suicide crisis helpline, such as the National Suicide Prevention Lifeline at 504-862-1060 or 988 in the [place] This is open 24 hours a day in the [place] If you're a Veteran: Call 988 and press 1. This is open 24 hours a day. Text the [employer] at (949)657-9954. Summary Opioid medicines can help you manage moderate to severe pain for a short period of time. A pain treatment plan is an agreement between you and your health care provider. Discuss the goals of your treatment, including how much pain you might expect to have and how you will manage the pain. If you think that you or someone else may have taken too much of an opioid, get medical help right away. This information is not intended to replace advice given to you by your health care provider. Make sure you discuss any questions you have with your health care provider. Document Revised: 10/22/2022 Document Reviewed: 04/27/2020 Elsevier Patient Education  2024 Elsevier Inc.  Advanced directives: (Declined) Advance directive discussed with you today. Even though you declined this today, please call our office should you change  your mind, and we can give you the proper paperwork for you to fill out. Advance Care Planning is important because it:  [x]  Makes sure you receive the medical care that is consistent with your values, goals, and preferences  [x]  It provides guidance to your family and loved ones and reduces their decisional burden about whether or not they are making the right decisions based on your wishes.  Follow the link provided in your after visit summary or read over the paperwork we have mailed to you to help you started getting your Advance Directives in place. If you need assistance in completing these, please reach out to us  so that we can help you!  See attachments for Preventive Care and Fall Prevention Tips.

## 2023-11-05 ENCOUNTER — Ambulatory Visit (INDEPENDENT_AMBULATORY_CARE_PROVIDER_SITE_OTHER)

## 2023-11-05 DIAGNOSIS — Z23 Encounter for immunization: Secondary | ICD-10-CM | POA: Diagnosis not present

## 2023-12-19 ENCOUNTER — Other Ambulatory Visit: Payer: Self-pay | Admitting: Family Medicine

## 2023-12-19 DIAGNOSIS — M150 Primary generalized (osteo)arthritis: Secondary | ICD-10-CM

## 2023-12-19 DIAGNOSIS — M545 Low back pain, unspecified: Secondary | ICD-10-CM

## 2023-12-19 DIAGNOSIS — G8929 Other chronic pain: Secondary | ICD-10-CM

## 2023-12-21 NOTE — Telephone Encounter (Signed)
 Requested medication (s) are due for refill today: Yes  Requested medication (s) are on the active medication list: Yes  Last refill:  07/18/23  Future visit scheduled: Yes  Notes to clinic:  Unable to refill per protocol, cannot delegate.      Requested Prescriptions  Pending Prescriptions Disp Refills   traMADol  (ULTRAM ) 50 MG tablet [Pharmacy Med Name: TRAMADOL  50MG  TABLETS] 45 tablet     Sig: TAKE 1 TABLET(50 MG) BY MOUTH EVERY 8 HOURS AS NEEDED FOR MODERATE PAIN OR SEVERE PAIN     Not Delegated - Analgesics:  Opioid Agonists Failed - 12/21/2023  9:34 AM      Failed - This refill cannot be delegated      Failed - Urine Drug Screen completed in last 360 days      Failed - Valid encounter within last 3 months    Recent Outpatient Visits           5 months ago Benign hypertension with CKD (chronic kidney disease) stage III   [facility] [place], Marsa PARAS, [place]

## 2024-01-20 ENCOUNTER — Other Ambulatory Visit: Payer: Self-pay | Admitting: Family Medicine

## 2024-01-20 ENCOUNTER — Ambulatory Visit: Admitting: Family Medicine

## 2024-01-20 ENCOUNTER — Encounter: Payer: Self-pay | Admitting: Family Medicine

## 2024-01-20 VITALS — BP 130/82 | HR 70 | Ht 64.0 in | Wt 176.5 lb

## 2024-01-20 DIAGNOSIS — E559 Vitamin D deficiency, unspecified: Secondary | ICD-10-CM

## 2024-01-20 DIAGNOSIS — E782 Mixed hyperlipidemia: Secondary | ICD-10-CM | POA: Diagnosis not present

## 2024-01-20 DIAGNOSIS — R7309 Other abnormal glucose: Secondary | ICD-10-CM | POA: Diagnosis not present

## 2024-01-20 DIAGNOSIS — E039 Hypothyroidism, unspecified: Secondary | ICD-10-CM

## 2024-01-20 DIAGNOSIS — N183 Chronic kidney disease, stage 3 unspecified: Secondary | ICD-10-CM | POA: Diagnosis not present

## 2024-01-20 DIAGNOSIS — I700 Atherosclerosis of aorta: Secondary | ICD-10-CM

## 2024-01-20 DIAGNOSIS — Z853 Personal history of malignant neoplasm of breast: Secondary | ICD-10-CM

## 2024-01-20 DIAGNOSIS — I129 Hypertensive chronic kidney disease with stage 1 through stage 4 chronic kidney disease, or unspecified chronic kidney disease: Secondary | ICD-10-CM | POA: Diagnosis not present

## 2024-01-20 DIAGNOSIS — M150 Primary generalized (osteo)arthritis: Secondary | ICD-10-CM

## 2024-01-20 LAB — POCT GLYCOSYLATED HEMOGLOBIN (HGB A1C): Hemoglobin A1C: 5.3 % (ref 4.0–5.6)

## 2024-01-20 NOTE — Patient Instructions (Addendum)
 Thank you for coming to the office today.  Recent Labs    07/11/23 0825 01/20/24 1327  HGBA1C 5.5 5.3   Keep medications current, no additional changes.  Updated vaccine history  DUE for FASTING BLOOD WORK (no food or drink after midnight before the lab appointment, only water or coffee without cream/sugar on the morning of)  SCHEDULE Lab Only visit in the morning at the clinic for lab draw in 6 MONTHS   - Make sure Lab Only appointment is at about 1 week before your next appointment, so that results will be available  For Lab Results, once available within 2-3 days of blood draw, you can can log in to MyChart online to view your results and a brief explanation. Also, we can discuss results at next follow-up visit.  Please schedule a Follow-up Appointment to: Return for 6 month fasting lab > 1 week later Medicare Physical.  If you have any other questions or concerns, please feel free to call the office or send a message through MyChart. You may also schedule an earlier appointment if necessary.  Additionally, you may be receiving a survey about your experience at our office within a few days to 1 week by e-mail or mail. We value your feedback.  Marsa Officer, DO [facility], [place]

## 2024-01-20 NOTE — Progress Notes (Signed)
 "  Subjective:    Patient ID: Angela Bullock, female    DOB: September 23, 1935, 88 y.o.   MRN: [id number]  Angela Bullock is a 88 y.o. female presenting on 01/20/2024 for Medical Management of Chronic Issues   HPI  Discussed the use of AI scribe software for clinical note transcription with the patient, who gave verbal consent to proceed.  History of Present Illness   Angela Bullock is an 88 year old female who presents for routine follow-up and medication refills.  Elevated A1c Glycemic control - Blood sugar levels improved - Hemoglobin A1c decreased from 5.5 to 5.3, now below prediabetes range - Monitors blood sugar due to prior elevations - Satisfied with current glycemic control  Ophthalmologic procedures - Underwent two cataract surgeries since last visit  Dental procedures Dr Jakie Gibbs Dentist / Oral Surgery - Currently undergoing dental implant placement, s/p dental extraction - Lost one tooth and had another extracted due to crowding  Peripheral edema - Observes fluid retention in legs, evidenced by sock line - No associated pain or discomfort - Family history of maternal ankle swelling in summer  Immunization status - Up to date on pneumonia and influenza vaccinations - Previously received RSV vaccine - Has not received COVID booster this year and expresses hesitancy       Chronic Pain Multiple Joints / Osteoarthritis Multiple joints (knees, back) Doing well currently Recent flare then resolved She has CKD and prefers to limit Meloxicam  NSAID - She is on Omega, Resveratrol, CoQ10, Glucosamine-Chondroitin Taking Tylenol  Arthritis and Tylenol  PM at night with relief, less waking up Taking Tramadol  AS NEEDED, not every day  Recent flare up with Knee Pain and inflammation, she has mixed Ibuprofen short term and Tramadol  as needed.   CHRONIC HTN / CKD-III HYPERTENSION controlled Current Meds - Lisinopril  40mg  daily currently Reports good compliance, took meds  today. Tolerating well, w/o complaints. Denies CP, dyspnea, HA, edema, dizziness / lightheadedness   Hypercalcemia   Hair Loss, chronic She has tried topical minoxidil formulation topical for years. Mixed results. On Minoxidil per Derm    Breast Cancer Surveillance See prior history Update w/ CA27.29 and lab result is 29 (2025), negative result.   Hypothyroidism Has been on Levothyroxine  25mcg daily low dose Normalized labs TSH T4   HYPERLIPIDEMIA: - Reports no concerns. Last lipid panel 06/2023, controlled LDL 60, excellent improvement - Currently taking Atorvastatin  10mg , tolerating well without side effects or myalgias Not interested in CT Coronary Scan She also uses supplements like omega, turmeric, and resveratrol for pain relief and anti-inflammatory purposes.    Health Maintenance:   She is up to date with her vaccinations, including COVID boosters, RSV, tetanus, pneumonia, and shingles      01/20/2024    1:14 PM 09/19/2023    2:13 PM 07/18/2023    9:00 AM  Depression screen PHQ 2/9  Decreased Interest 0 0 0  Down, Depressed, Hopeless 0 0 0  PHQ - 2 Score 0 0 0  Altered sleeping   0  Tired, decreased energy   1  Change in appetite   0  Feeling bad or failure about yourself    1  Trouble concentrating   0  Moving slowly or fidgety/restless   0  Suicidal thoughts   0  PHQ-9 Score   2      Data saved with a previous flowsheet row definition       01/20/2024    1:15 PM 07/18/2023    9:00 AM  01/17/2023   10:57 AM 07/12/2022   11:27 AM  GAD 7 : Generalized Anxiety Score  Nervous, Anxious, on Edge 0 0 0 0  Control/stop worrying 0 0 0 0  Worry too much - different things 0 0 0 0  Trouble relaxing 0 0 0 0  Restless 0 0 0 0  Easily annoyed or irritable 0 0 0 1  Afraid - awful might happen 0 0 0 0  Total GAD 7 Score 0 0 0 1  Anxiety Difficulty    Not difficult at all    Social History[1]  Review of Systems  Constitutional:  Negative for activity change,  appetite change, chills, diaphoresis, fatigue and fever.  HENT:  Negative for congestion and hearing loss.   Eyes:  Negative for visual disturbance.  Respiratory:  Negative for cough, chest tightness, shortness of breath and wheezing.   Cardiovascular:  Negative for chest pain, palpitations and leg swelling.  Gastrointestinal:  Negative for abdominal pain, constipation, diarrhea, nausea and vomiting.  Genitourinary:  Negative for dysuria, frequency and hematuria.  Musculoskeletal:  Negative for arthralgias and neck pain.  Skin:  Negative for rash.  Neurological:  Negative for dizziness, weakness, light-headedness, numbness and headaches.  Hematological:  Negative for adenopathy.  Psychiatric/Behavioral:  Negative for behavioral problems, dysphoric mood and sleep disturbance.    Per HPI unless specifically indicated above     Objective:    BP 130/82 (BP Location: Left Arm, Patient Position: Sitting, Cuff Size: Normal)   Pulse 70   Ht 5' 4 (1.626 m)   Wt 176 lb 8 oz (80.1 kg)   SpO2 95%   BMI 30.30 kg/m   Wt Readings from Last 3 Encounters:  01/20/24 176 lb 8 oz (80.1 kg)  09/19/23 172 lb (78 kg)  09/11/23 172 lb (78 kg)    Physical Exam Vitals and nursing note reviewed.  Constitutional:      General: She is not in acute distress.    Appearance: She is well-developed. She is not diaphoretic.     Comments: Well-appearing, comfortable, cooperative  HENT:     Head: Normocephalic and atraumatic.  Eyes:     General:        Right eye: No discharge.        Left eye: No discharge.     Conjunctiva/sclera: Conjunctivae normal.     Pupils: Pupils are equal, round, and reactive to light.  Neck:     Thyroid : No thyromegaly.  Cardiovascular:     Rate and Rhythm: Normal rate and regular rhythm.     Pulses: Normal pulses.     Heart sounds: Murmur (stable systolic murmur) heard.  Pulmonary:     Effort: Pulmonary effort is normal. No respiratory distress.     Breath sounds: Normal  breath sounds. No wheezing or rales.  Abdominal:     General: Bowel sounds are normal. There is no distension.     Palpations: Abdomen is soft. There is no mass.     Tenderness: There is no abdominal tenderness.  Musculoskeletal:        General: No tenderness. Normal range of motion.     Cervical back: Normal range of motion and neck supple.     Comments: Upper / Lower Extremities: - Normal muscle tone, strength bilateral upper extremities 5/5, lower extremities 5/5  Lymphadenopathy:     Cervical: No cervical adenopathy.  Skin:    General: Skin is warm and dry.     Findings: No erythema or rash.  Neurological:     Mental Status: She is alert and oriented to person, place, and time.     Comments: Distal sensation intact to light touch all extremities  Psychiatric:        Mood and Affect: Mood normal.        Behavior: Behavior normal.        Thought Content: Thought content normal.     Comments: Well groomed, good eye contact, normal speech and thoughts     Results for orders placed or performed in visit on 01/20/24  POCT HgB A1C   Collection Time: 01/20/24  1:27 PM  Result Value Ref Range   Hemoglobin A1C 5.3 4.0 - 5.6 %   HbA1c POC (<> result, manual entry)     HbA1c, POC (prediabetic range)     HbA1c, POC (controlled diabetic range)        Assessment & Plan:   Problem List Items Addressed This Visit     Acquired hypothyroidism   Atherosclerosis of aorta   Benign hypertension with CKD (chronic kidney disease) stage III - Primary   Elevated hemoglobin A1c   Relevant Orders   POCT HgB A1C (Completed)   Hyperlipidemia     Hypertensive chronic kidney disease stage 3 Blood pressure controlled at 130/82 mmHg. Kidney function stable. - Continue current antihypertensive regimen.  History of abnormal glucose A1c at 5.3%, indicating good glycemic control. - Continue current management and monitoring of blood glucose levels.  Heart murmur Chronic heart murmur stable  with no changes. - Continue routine monitoring.  Peripheral edema Mild edema likely due to gravity and venous insufficiency. No significant symptoms or impairment. - Consider compression stockings if desired. - Elevate legs to reduce edema.  General health maintenance Up to date on pneumonia and flu vaccinations. RSV vaccination received. COVID booster not pursued. - Continue routine health maintenance and vaccinations as needed.         Orders Placed This Encounter  Procedures   POCT HgB A1C    No orders of the defined types were placed in this encounter.   Follow up plan: Return for 6 month fasting lab > 1 week later Medicare Physical.  I am unable to order any future orders due to medicare system on chart, not covering any diagnosis code.  Marsa Officer, DO [facility] 01/20/2024, 1:29 PM     [1]  Social History Tobacco Use   Smoking status: Never   Smokeless tobacco: Never  Vaping Use   Vaping status: Never Used  Substance Use Topics   Alcohol use: No    Alcohol/week: 0.0 standard drinks of alcohol   Drug use: No   "

## 2024-07-14 ENCOUNTER — Other Ambulatory Visit

## 2024-07-21 ENCOUNTER — Encounter: Admitting: Family Medicine

## 2024-10-02 ENCOUNTER — Ambulatory Visit

## 2024-10-14 ENCOUNTER — Ambulatory Visit
